# Patient Record
Sex: Female | Born: 1950 | Race: White | Hispanic: No | Marital: Married | Smoking: Never smoker
Health system: Southern US, Community
[De-identification: ages and names within clinical notes are randomized; demographics above are authoritative.]

## PROBLEM LIST (undated history)

## (undated) DIAGNOSIS — E785 Hyperlipidemia, unspecified: Secondary | ICD-10-CM

## (undated) DIAGNOSIS — Z720 Tobacco use: Secondary | ICD-10-CM

## (undated) DIAGNOSIS — I2510 Atherosclerotic heart disease of native coronary artery without angina pectoris: Secondary | ICD-10-CM

## (undated) DIAGNOSIS — I10 Essential (primary) hypertension: Secondary | ICD-10-CM

## (undated) DIAGNOSIS — B351 Tinea unguium: Secondary | ICD-10-CM

## (undated) DIAGNOSIS — K219 Gastro-esophageal reflux disease without esophagitis: Secondary | ICD-10-CM

## (undated) DIAGNOSIS — Z9289 Personal history of other medical treatment: Secondary | ICD-10-CM

## (undated) DIAGNOSIS — Z8669 Personal history of other diseases of the nervous system and sense organs: Secondary | ICD-10-CM

## (undated) DIAGNOSIS — Z9889 Other specified postprocedural states: Secondary | ICD-10-CM

## (undated) DIAGNOSIS — Z2821 Immunization not carried out because of patient refusal: Secondary | ICD-10-CM

## (undated) DIAGNOSIS — R011 Cardiac murmur, unspecified: Secondary | ICD-10-CM

## (undated) DIAGNOSIS — M1990 Unspecified osteoarthritis, unspecified site: Secondary | ICD-10-CM

## (undated) DIAGNOSIS — J309 Allergic rhinitis, unspecified: Secondary | ICD-10-CM

## (undated) HISTORY — DX: Personal history of other diseases of the nervous system and sense organs: Z86.69

## (undated) HISTORY — DX: Tobacco use: Z72.0

## (undated) HISTORY — DX: Hyperlipidemia, unspecified: E78.5

## (undated) HISTORY — PX: ABDOMINAL HYSTERECTOMY: SHX81

## (undated) HISTORY — DX: Other specified postprocedural states: Z98.890

## (undated) HISTORY — DX: Cardiac murmur, unspecified: R01.1

## (undated) HISTORY — DX: Essential (primary) hypertension: I10

## (undated) HISTORY — DX: Allergic rhinitis, unspecified: J30.9

## (undated) HISTORY — DX: Immunization not carried out because of patient refusal: Z28.21

## (undated) HISTORY — DX: Atherosclerotic heart disease of native coronary artery without angina pectoris: I25.10

## (undated) HISTORY — DX: Tinea unguium: B35.1

## (undated) HISTORY — DX: Gastro-esophageal reflux disease without esophagitis: K21.9

## (undated) HISTORY — DX: Unspecified osteoarthritis, unspecified site: M19.90

## (undated) HISTORY — PX: EYE SURGERY: SHX253

## (undated) HISTORY — DX: Personal history of other medical treatment: Z92.89

---

## 1998-09-17 ENCOUNTER — Emergency Department (HOSPITAL_COMMUNITY): Admission: EM | Admit: 1998-09-17 | Discharge: 1998-09-17 | Payer: Self-pay | Admitting: Emergency Medicine

## 1998-09-20 ENCOUNTER — Emergency Department (HOSPITAL_COMMUNITY): Admission: EM | Admit: 1998-09-20 | Discharge: 1998-09-20 | Payer: Self-pay | Admitting: Emergency Medicine

## 2000-10-20 ENCOUNTER — Emergency Department (HOSPITAL_COMMUNITY): Admission: EM | Admit: 2000-10-20 | Discharge: 2000-10-21 | Payer: Self-pay | Admitting: Emergency Medicine

## 2000-12-13 ENCOUNTER — Ambulatory Visit (HOSPITAL_COMMUNITY): Admission: RE | Admit: 2000-12-13 | Discharge: 2000-12-13 | Payer: Self-pay | Admitting: Obstetrics and Gynecology

## 2001-04-18 ENCOUNTER — Encounter: Payer: Self-pay | Admitting: Emergency Medicine

## 2001-04-18 ENCOUNTER — Observation Stay (HOSPITAL_COMMUNITY): Admission: EM | Admit: 2001-04-18 | Discharge: 2001-04-19 | Payer: Self-pay | Admitting: Emergency Medicine

## 2001-11-27 HISTORY — PX: LAPAROSCOPY: SHX197

## 2003-08-28 ENCOUNTER — Emergency Department (HOSPITAL_COMMUNITY): Admission: EM | Admit: 2003-08-28 | Discharge: 2003-08-28 | Payer: Self-pay | Admitting: Emergency Medicine

## 2003-12-29 HISTORY — PX: CAROTID ANGIOGRAM: SHX5765

## 2004-07-04 ENCOUNTER — Encounter: Admission: RE | Admit: 2004-07-04 | Discharge: 2004-07-04 | Payer: Self-pay | Admitting: Family Medicine

## 2004-07-11 ENCOUNTER — Encounter: Admission: RE | Admit: 2004-07-11 | Discharge: 2004-07-11 | Payer: Self-pay | Admitting: Family Medicine

## 2005-11-04 ENCOUNTER — Other Ambulatory Visit: Admission: RE | Admit: 2005-11-04 | Discharge: 2005-11-04 | Payer: Self-pay | Admitting: Family Medicine

## 2005-11-27 DIAGNOSIS — I2510 Atherosclerotic heart disease of native coronary artery without angina pectoris: Secondary | ICD-10-CM

## 2005-11-27 DIAGNOSIS — Z9889 Other specified postprocedural states: Secondary | ICD-10-CM

## 2005-11-27 HISTORY — DX: Other specified postprocedural states: Z98.890

## 2005-11-27 HISTORY — DX: Atherosclerotic heart disease of native coronary artery without angina pectoris: I25.10

## 2005-11-27 HISTORY — PX: CARDIAC CATHETERIZATION: SHX172

## 2005-11-27 HISTORY — PX: LEFT HEART CATH AND CORONARY ANGIOGRAPHY: CATH118249

## 2005-12-02 ENCOUNTER — Observation Stay (HOSPITAL_COMMUNITY): Admission: EM | Admit: 2005-12-02 | Discharge: 2005-12-02 | Payer: Self-pay | Admitting: Emergency Medicine

## 2006-12-17 ENCOUNTER — Ambulatory Visit: Payer: Self-pay | Admitting: Family Medicine

## 2006-12-23 ENCOUNTER — Ambulatory Visit: Payer: Self-pay | Admitting: Family Medicine

## 2006-12-23 ENCOUNTER — Other Ambulatory Visit: Admission: RE | Admit: 2006-12-23 | Discharge: 2006-12-23 | Payer: Self-pay | Admitting: Family Medicine

## 2006-12-29 ENCOUNTER — Encounter: Admission: RE | Admit: 2006-12-29 | Discharge: 2006-12-29 | Payer: Self-pay | Admitting: Family Medicine

## 2007-04-11 ENCOUNTER — Ambulatory Visit: Payer: Self-pay | Admitting: Family Medicine

## 2007-04-12 ENCOUNTER — Encounter: Admission: RE | Admit: 2007-04-12 | Discharge: 2007-04-12 | Payer: Self-pay | Admitting: Family Medicine

## 2007-04-28 ENCOUNTER — Ambulatory Visit: Payer: Self-pay | Admitting: Family Medicine

## 2007-07-17 ENCOUNTER — Emergency Department (HOSPITAL_COMMUNITY): Admission: EM | Admit: 2007-07-17 | Discharge: 2007-07-17 | Payer: Self-pay | Admitting: Emergency Medicine

## 2007-12-29 HISTORY — PX: ESOPHAGOGASTRODUODENOSCOPY: SHX1529

## 2007-12-29 HISTORY — PX: COLONOSCOPY: SHX174

## 2008-01-15 ENCOUNTER — Emergency Department (HOSPITAL_COMMUNITY): Admission: EM | Admit: 2008-01-15 | Discharge: 2008-01-15 | Payer: Self-pay | Admitting: Emergency Medicine

## 2008-01-16 ENCOUNTER — Other Ambulatory Visit: Admission: RE | Admit: 2008-01-16 | Discharge: 2008-01-16 | Payer: Self-pay | Admitting: Family Medicine

## 2008-01-16 ENCOUNTER — Ambulatory Visit: Payer: Self-pay | Admitting: Family Medicine

## 2008-01-16 LAB — HM PAP SMEAR: HM Pap smear: NEGATIVE

## 2008-04-20 LAB — HM MAMMOGRAPHY

## 2008-05-22 ENCOUNTER — Ambulatory Visit: Payer: Self-pay | Admitting: Family Medicine

## 2008-06-18 LAB — HM COLONOSCOPY: HM Colonoscopy: NORMAL

## 2008-09-28 ENCOUNTER — Ambulatory Visit: Payer: Self-pay | Admitting: Family Medicine

## 2008-12-06 ENCOUNTER — Ambulatory Visit: Payer: Self-pay | Admitting: Family Medicine

## 2008-12-12 ENCOUNTER — Ambulatory Visit: Payer: Self-pay | Admitting: Family Medicine

## 2009-05-16 ENCOUNTER — Ambulatory Visit: Payer: Self-pay | Admitting: Family Medicine

## 2010-07-06 ENCOUNTER — Emergency Department (HOSPITAL_COMMUNITY): Admission: EM | Admit: 2010-07-06 | Discharge: 2010-07-06 | Payer: Self-pay | Admitting: Emergency Medicine

## 2010-07-12 ENCOUNTER — Ambulatory Visit (HOSPITAL_COMMUNITY): Admission: RE | Admit: 2010-07-12 | Discharge: 2010-07-12 | Payer: Self-pay | Admitting: Internal Medicine

## 2010-07-16 ENCOUNTER — Ambulatory Visit: Payer: Self-pay | Admitting: Family Medicine

## 2010-07-21 ENCOUNTER — Ambulatory Visit: Payer: Self-pay | Admitting: Physician Assistant

## 2010-10-28 ENCOUNTER — Ambulatory Visit: Payer: Self-pay | Admitting: Family Medicine

## 2010-11-07 ENCOUNTER — Encounter: Admission: RE | Admit: 2010-11-07 | Discharge: 2010-11-07 | Payer: Self-pay | Admitting: Family Medicine

## 2010-11-24 ENCOUNTER — Ambulatory Visit: Payer: Self-pay | Admitting: Family Medicine

## 2011-01-27 ENCOUNTER — Other Ambulatory Visit: Payer: Self-pay | Admitting: Gastroenterology

## 2011-01-30 ENCOUNTER — Other Ambulatory Visit: Payer: Self-pay

## 2011-02-04 ENCOUNTER — Ambulatory Visit
Admission: RE | Admit: 2011-02-04 | Discharge: 2011-02-04 | Disposition: A | Discharge status: Home / Self Care | Payer: Self-pay | Source: Ambulatory Visit | Attending: Gastroenterology | Admitting: Gastroenterology

## 2011-03-19 ENCOUNTER — Institutional Professional Consult (permissible substitution) (INDEPENDENT_AMBULATORY_CARE_PROVIDER_SITE_OTHER): Payer: BC Managed Care – PPO | Admitting: Family Medicine

## 2011-03-19 DIAGNOSIS — I10 Essential (primary) hypertension: Secondary | ICD-10-CM

## 2011-05-15 NOTE — Cardiovascular Report (Signed)
NAMEMARGARITE, [name]               ACCOUNT NO.:  [id number]   MEDICAL RECORD NO.:  [id number]          PATIENT TYPE:  OBV   LOCATION:  6733                         FACILITY:  [facility]   PHYSICIAN:  Cristy Hilts. Jacinto Halim, MD       DATE OF BIRTH:  Apr 27, 1951   DATE OF PROCEDURE:  12/02/2005  DATE OF DISCHARGE:                              CARDIAC CATHETERIZATION   PROCEDURE PERFORMED:  1.  Left ventriculography.  2.  Selective right and left coronary angiography.  3.  Ascending aortogram.  4.  Abdominal aortogram.   INDICATIONS FOR PROCEDURE:  [name] is a 60 year old female with a  history of hypertension and obesity who was admitted to the hospital with  chest pain suggested of angina pectoris. Given this she was brought to the  catheterization laboratory where we evaluated her coronary anatomy for a  definitive diagnosis of coronary artery disease.   HEMODYNAMIC DATA:  The left ventricular pressures were 146/-1 with an end-  diastolic pressure of 13 mmHg. The aortic pressures were 144/75 with a mean  of 105 mmHg. There were no pressure gradient across the aortic valve.   ANGIOGRAPHIC DATA:  1.  Left ventricular systolic function pressure was normal and the ejection      fraction was estimated at 60% to 65%. There was no significant mitral      regurgitation.  2.  The right coronary artery is a large caliber [name] and dominant [name].      It is normal.  3.  The left main is a large caliber [name] and is normal.  4.  The circumflex is a large caliber [name]. It gives rise to a small OM-1      and continues as a large OM-2 after giving a moderate size AV groove      branch. It is normal.  5.  The LAD is a large caliber [name]. It gives rise to a large diagonal-1.      It is normal.   ASCENDING AORTOGRAM:  The ascending aortogram reveals the presence of three  aortic valve cusps. There was no aortic regurgitation. No evidence of  ascending aortic aneurysm.   ABDOMINAL AORTOGRAM:   The abdominal aortogram revealed the presence of two  renal arteries, one on either side. They are widely patent. There was no  evidence of abdominal aortic aneurysm. The aortic iliac bifurcation was  widely patent.   IMPRESSION:  1.  Normal coronaries.  2.  Normal left ventricular systolic function with an ejection fraction of      60% to 65%.  3.  No evidence of renal artery stenosis.   RECOMMENDATIONS:  Evaluation of noncardiac cause of chest pain is indicated.  The patient can be safely discharged home today, and she will follow up with  [facility] for noncardiac chest pain evaluation.   TECHNIQUES OF PROCEDURE:  Under usual sterile precautions using a 6-French  right femoral artery access, 6-French multipurpose B-2 catheter was advanced  into the ascending aorta over a 0.035 inch pigtail catheter.  It was gently  advanced into the left ventricle,  and left ventricular pressures were  monitored.  Hand contrast injection of the left ventricle was performed both  in the LAO and RAO projection. The catheter was flushed with saline, pulled  back, and pressure gradient across the aortic valve was monitored.  The  right coronary artery was selectively engaged and angiography was performed.  Then, the ascending aortogram was performed in the LAO projection. The  catheter was pulled back in the abdominal aorta and abdominal aortogram was  performed.  A 6-French Judkins' JL4 diagnostic catheter was utilized,  engaged the left coronary artery, and angiography was performed. The  Angiocatheter was pulled out of the body in the usual fashion. Right femoral  angiography was performed through the arterial access sheath, but because of  the access being at the bifurcation of SFA and profunda, it was decided to  do manual pressure only at this time.      Cristy Hilts. Jacinto Halim, MD  Electronically Signed     JRG/MEDQ  D:  12/02/2005  T:  12/02/2005  Job:  [id number]   cc:   Thereasa Solo.  Little, M.D.  Fax: [phone]

## 2011-05-15 NOTE — H&P (Signed)
[place]  Patient:    [name], [name]                        MRN: [id number] Adm. Date:  04/18/01 Attending:  Julieanne Manson, M.D. CC:         Ronnald Nian, M.D.                         History and Physical  HISTORY OF PRESENT ILLNESS:  Ms. Belle is a 60 year old female who was awakened from sleep about 4 oclock a.m. with left anterior chest pain just under the left breast. She took two Tums with some mild relief and burping. She called her daughter who said she developed generalized weakness. Still had pain under her left breast, mild shortness of breath, became diaphoretic with this, had some nausea but no vomiting. She presented to the emergency room.  One day previous to this, she had diaphoresis and some left jaw and left lateral neck pain. She denies any shortness of breath, prior chest pain. She has been more constipated since Christmas. She has no other GI complaints. In the emergency room, she was given sublingual nitroglycerin x 2 with some improvement of her discomfort. She had a similar episode about 6 months ago but not as intense as what she described today.  She has had no significant arrhythmias. Has had no unilateral weakness or blurred vision.  CURRENT MEDICATIONS:  None.  PAST MEDICAL HISTORY:  History of questionable hypertension on no treatment. Stress test in 1994 was normal.  PAST SURGICAL HISTORY:  Status post hysterectomy age 72, laparoscopic surgery for left ovarian cyst December 2001.  ALLERGIES:  None.  SOCIAL HISTORY:  The patient dips snuff. Denies smoking. Two beers per day. Three children, eight grandchildren. Married 31 years. 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 Negative for any type of physical activity. Poor diet, a lot of fast foods, no salt restriction.  FAMILY HISTORY:  Positive for hypertension and heart failure in her mother. Father died at age 87, CAD. Has a brother with a  pacemaker and CAD. Two other brothers with cardiac histories as young as age 102.  REVIEW OF SYSTEMS:  No headaches, visual changes. No heat intolerance. No shortness of breath, cough, or wheezing. Rare episodes of a fluttering sensation at the base of her throat. Increased constipation but no blood in her bowel movements since Christmas. States this all occurred after her laparoscopy. Negative for URI-type complaints. Has had some swelling of her ankles and knees. Weights up 20 pounds in the last 2 years.  PHYSICAL EXAMINATION:  GENERAL:  Obese female, no acute distress.  VITAL SIGNS:  Blood pressure 120/70, pulse 54, respirations 20, temperature 97.6, saturations on room air 99%.  HEENT:  Unremarkable.  NECK:  Negative for JVD, carotid bruits. No thyroid enlargement or cervical adenopathy.  LUNGS:  Clear bilaterally. No wheezes heard.  CARDIAC:  Regular rhythm with murmurs.  ABDOMEN:  Mild left upper quadrant and midepigastric tenderness with no guarding or rebound. Normal bowel sounds. No abdominal bruits.  EXTREMITIES:  Negative for edema, +2 pulses bilaterally.  NEUROLOGICAL:  The patient is alert and oriented x 3 with no gross focal neurologic deficits.  SKIN:  Warm and dry.  LABORATORY DATA:  EKG normal.  Cardiac enzymes unremarkable. Hemoglobin is 13.  ASSESSMENT AND PLAN:  Chest pain. Suspect gastrointestinal in nature, doubt cardiac although very strong  family history. The fact that she got some relief with burping and belching, has epigastric tenderness makes me think this is probably GI. I have started her on H2 blockers. Will rule out an MI. If this is negative, we will schedule for a nuclear study.DD:  04/18/01 TD:  04/18/01 Job: [id number]

## 2011-05-15 NOTE — H&P (Signed)
NAME:  MCKINSEY, KEAGLE NO.:  [id number]   MEDICAL RECORD NO.:  [id number]          PATIENT TYPE:  EMS   LOCATION:  MAJO                         FACILITY:  [facility]   PHYSICIAN:  Ulyses Amor, MD DATE OF BIRTH:  [birth year]   DATE OF ADMISSION:  12/01/2005  DATE OF DISCHARGE:                                HISTORY & PHYSICAL   [name] is a 60 year old white woman who is admitted to [facility] for further evaluation of chest pain.   The patient, who has no past history of cardiac disease, experienced the  onset of chest pain while sitting on the couch this evening. This was  described as a sharp discomfort located in the left inframammary and left  anterior subcostal region. It radiated up to her neck. It was associated  with dyspnea, diaphoresis and mild nausea. She also experienced tingling of  her right hand. There were no exacerbating or ameliorating factors. It  appeared not to be related to position, activity, meals, or respirations. It  eventually resolved after approximately one hour. She is largely  asymptomatic at this time.   As noted, the patient has no past history of cardiac disease, including no  history of myocardial infarction, coronary artery disease, congestive heart  failure, or arrhythmia. She did undergo stress testing by Dr. Clarene Duke in the  remote past; this was reportedly negative. She has a history of hypertension  for which she takes medications. She is also known to have elevated blood  lipids but she has not been placed on medications for these yet. There was  no history of smoking. There was no history of diabetes mellitus. There was  a strong family history of coronary artery disease. Her father died of a  myocardial infarction at age 6 and the mother suffered from coronary artery  disease though at an advanced age.   MEDICATIONS:  The patient's only medication is Lotensin.   ALLERGIES:  None.   OPERATIONS:   Hysterectomy.   SOCIAL HISTORY:  The patient lives with her husband. 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 She does not smoke cigarettes. She drinks occasional  beer.   REVIEW OF SYSTEMS:  No problems related to her head, eyes, ears, nose,  mouth, throat, lungs, gastrointestinal system, genitourinary system, or  extremities. There is no history of neurologic or psychiatric disorder.  There is no history of fever, chills, or weight loss.   PHYSICAL EXAMINATION:  Blood pressure 129/65, pulse 70 and regular,  respirations 18, temperature 98.3. The patient was an obese, middle-aged  white woman in no discomfort. She was alert, oriented, appropriate and  responsive. Head, eyes, nose, and mouth were normal. The neck was without  thyromegaly or adenopathy. Carotid pulses were palpable bilaterally without  bruits. Cardiac examination revealed a normal S1 and S2. There was no S3,  S4, murmur, rub or click. Cardiac rhythm was regular. No chest wall  tenderness was noted. The lungs were clear. The abdomen was soft and  nontender. There was no mass, hepatosplenomegaly, bruit, distension,  rebound, guarding, or rigidity. Bowel  sounds were normal. Breast, pelvic,  and rectal examination were not performed as they were not pertinent to the  reason for acute care hospitalization. The extremities were without edema,  deviation or deformity. Radial and dorsalis pedis pulses were palpable  bilaterally. Brief screening neurologic survey was unremarkable.   The electrocardiogram was normal. The chest radiograph, according to the  radiologist, demonstrated no evidence of acute disease. There was mild  bibasilar atelectasis. The initial set of cardiac markers revealed a  myoglobin of 38.3, CK-MB 1.2 and troponin less than 0.05. The second set of  cardiac markers revealed a myoglobin of 48.3, CK-MB 1.7, and troponin less  than 0.05. The third set of cardiac markers revealed a myoglobin of 60.7, CK-  MB 1.8,  and troponin less than 0.05. White count was 6.8 with a hemoglobin  of 12.5 and a hematocrit of 35.2. Fibrin derivatives were 0.28. Potassium  was 3.2, BUN 12, and creatinine 0.7. The remaining studies were pending at  the time of this dictation.   IMPRESSION:  1.  Chest pain:  Rule out coronary artery disease.  2.  Hypertension.   PLAN:  1.  Telemetry.  2.  Serial cardiac enzymes.  3.  Aspirin.  4.  Intravenous nitroglycerin.  5.  Intravenous heparin.  6.  Fasting lipid profile.  7.  Further measures per Dr. Clarene Duke.      Ulyses Amor, MD  Electronically Signed     MSC/MEDQ  D:  12/02/2005  T:  12/02/2005  Job:  [id number]   cc:   Thereasa Solo. Little, M.D.  Fax: [phone]

## 2011-05-15 NOTE — Discharge Summary (Signed)
[name], [name]               ACCOUNT NO.:  [id number]   MEDICAL RECORD NO.:  [id number]          PATIENT TYPE:  OBV   LOCATION:  6733                         FACILITY:  [facility]   PHYSICIAN:  Thereasa Solo. Little, M.D. DATE OF BIRTH:  1951-06-09   DATE OF ADMISSION:  12/01/2005  DATE OF DISCHARGE:  12/02/2005                                 DISCHARGE SUMMARY   CHIEF COMPLAINT:  1.  Chest pain, rule out MI.  2.  Questionable SVT, with no prior history, no documentation.  3.  Hypertension.  4.  Hypokalemia.   DISCHARGE DIAGNOSES:  1.  Chest pain, rule out myocardial infarction.  2.  Questionable supraventricular tachycardia, with no prior history, no      documentation.  3.  Hypertension.  4.  Hypokalemia.   PROCEDURE:  Cardiac catheterization, December 02, 2005, Dr. Jacinto Halim.   HISTORY:  The patient is a 60 year old female who presented to the emergency  room in the early a.m. of December 02, 2005 and was admitted by Dr. Waldon Reining  with complaints of chest pain.  The patient's only history is that of some  hypertension and prior hysterectomy.  Her medication on admission was  Lotensin only.  Because of her complaints, she was placed on heparin, IV  nitroglycerin, and subsequently taken to the catheterization lab that day by  Dr. Yates Decamp.  Cardiac catheterization showed completely normal coronaries,  with an EF of 65%.  It was Dr. Verl Dicker opinion that there was no cardiac  source for her chest discomfort and recommended that she be placed on a  proton pump inhibitor and return to her primary care which is [facility].  Labs on admission showed white count 6.8, hemoglobin 12.5,  hematocrit 35.2, platelet count 209,000.  Initial potassium on admission  which was actually at 8:30 p.m. on December 01, 2005 was 3.2, with glucose  137, BUN 12, and creatinine 0.7.  Followup the next morning showed her  potassium had been replaced and was 4.2.  Her glucose was 84.  BUN was 8,  creatinine 0.6.  CKs and troponins were all negative x3.  Total cholesterol  was 160, triglycerides 118, HDL 40, LDL 96.  Point of care markers on  admission were also negative.      Eber Hong, P.A.    ______________________________  Thereasa Solo Little, M.D.    WDJ/MEDQ  D:  02/03/2006  T:  02/03/2006  Job:  [id number]   cc:   [facility]

## 2011-05-15 NOTE — H&P (Signed)
[facility]  Patient:    [name], [name]                        MRN: [id number] Adm. Date:  12/13/00 Attending:  Juluis Mire, M.D.                         History and Physical  REASON FOR ADMISSION:         A 60 year old gravida 2, para 2, married white female presents for diagnostic laparoscopy, laser standby.  HISTORY:                      The patient had a previous total abdominal hysterectomy in 1979.  She was referred to our practice by Dr. Susann Givens for evaluation of a left ovarian cyst.  She had an ultrasound performed on November 01, 2000, as ordered by Dr. Susann Givens, with the finding of a 4.7 cm left ovarian cyst.  She had a longstanding history of increasing pelvic pain and abdominal discomfort, and it was for that indication the ultrasound was ordered.  She was seen in the office where was exam was basically unremarkable except for pelvic tenderness.  Follow-up ultrasound November 26, 2000, did reveal a 4 cm left ovarian cyst with septation and a slight solid area.  We watched this conservatively, and it did persist.  She was set up for laparoscopic evaluation.  She was seen in our office on December 09, 2000. Repeat ultrasound at that time had revealed basic resolution of the ovarian cyst.  The patient, however, was still experiencing pain and discomfort and after discussion, wishes to proceed with laparoscopic evaluation to rule out such processes as adhesions or endometriosis.  ALLERGIES:                    No known drug allergies.  MEDICATIONS:                  Vicodin.  PAST MEDICAL HISTORY:         Usual childhood diseases.  No significant sequelae.  PAST SURGICAL HISTORY:        She had a hysterectomy in 1979 and a bladder suspension.  OBSTETRICAL HISTORY:          She had three spontaneous vaginal deliveries.  FAMILY HISTORY:               Mother has a history of hypertension.  Both her mother and father have history of heart  disease.  Maternal grandmother had some form of cancer.  SOCIAL HISTORY:               Occasional alcohol with no tobacco use.  REVIEW OF SYSTEMS:            Noncontributory.  PHYSICAL EXAMINATION:  VITAL SIGNS:                  The patient is afebrile with stable vital signs.  HEENT:                        The patient normocephalic, pupils, equal, round, reactive to light and accommodation, extraocular movements intact, sclerae and conjunctivae clear.  Oropharynx clear.  NECK:                         Without thyromegaly.  BREASTS:  Not examined.  LUNGS:                        Clear.  CARDIOVASCULAR:               Regular rate and rhythm, no murmurs or gallops.  ABDOMINAL:                    Benign.  PELVIC:                       Normal external genitalia.  Vaginal mucosa is clear.  Cuff well healed.  Bimanual exam reveals tenderness but no masses.  RECTOVAGINAL:                 Clear.  EXTREMITIES:                  Trace edema.  NEUROLOGICAL:                 Grossly within normal limits.  IMPRESSION:                   Pelvic pain rule out pelvic adhesions versus endometriosis.  PLAN:                         The patient is to undergo laparoscopic evaluation.  The risks have been discussed including the risk of anesthesia. The risks of incisional infection or bruising.  The risk of vascular injury that could require transfusion with the risk of AIDS or hepatitis.  The risk of injury to adjacent organs such as bowel, bladder or that could require further exploratory surgery.  The risks of deep venous thrombosis and pulmonary embolus.  The patient expressed understanding of indications and risks. DD:  12/13/00 TD:  12/13/00 Job: [id number]

## 2011-05-15 NOTE — Op Note (Signed)
[facility]  Patient:    [name], [name]                      MRN: [id number] Proc. Date: 12/13/00 Adm. Date:  [id number] Disc. Date: [id number] Attending:  Shelba Flake                           Operative Report  PREOPERATIVE DIAGNOSIS:       Pelvic pain.  POSTOPERATIVE DIAGNOSIS:      Pelvic adhesions.  OPERATION:                    Diagnostic laparoscopy and lysis of adhesions.  SURGEON:                      Juluis Mire, M.D.  ASSISTANT:  ANESTHESIA:                   General endotracheal anesthesia.  ESTIMATED BLOOD LOSS:         Minimal.  PACKS AND DRAINS:             None.  INTRAOPERATIVE BLOOD PLACED:  None.  COMPLICATIONS:                None.  INDICATIONS:                  These are dictated in the history and physical.  DESCRIPTION OF PROCEDURE:     The patient was taken to the OR and placed in the supine position.  After satisfactory level of general endotracheal anesthesia was attained, the patient was placed in the dorsal lithotomy position using the Allen stirrups.  The abdomen, perineum and vagina were prepped out with Betadine.  Examination under anesthesia was unremarkable. Bladder was emptied by in and out catheterization.  A sponge stick was placed in the vaginal vault for elevation.  The patient was draped out as a sterile field.  A subumbilical incision was made with a knife. The Verres was then introduced into the abdominal cavity. The abdomen was inflated to 20 mmHg of pressure. The laparoscope was then introduced.  There was no evidence of injury to adjacent organs.  A 5 mm trocar was placed in the suprapubic area under direct visualization. Visualization revealed the upper abdomen including liver, tip of the gallbladder and both lateral gutters to be clear.  She had marked adhesions in the pelvic cavity, some involving the omentum to the vaginal cuff. These were taken down using scissors and Bipolar for  cautery.  The right ovary was plastered to the right pelvic sidewall.  We were able to visualize the cul-de-sac.  We tried to free up the bowel on the left side but due to the thickness of the adhesions, this was discontinued.  We never did see the left ovary.  We assumed it was encased in the intestines.  There was no active bleeding.  We used thorough irrigation.  At the end of the procedure we had freed up some of the pelvic adhesions.  Again, the right ovary was densely adherent to the right pelvic sidewall.  The left ovary was encased in the sigmoid colon.  At this point in time the abdomen was desufflated of carbon dioxide.  All trocars were removed.  The subumbilical incision was closed with interrupted subcuticular sutures of 4-0 Vicryl and suprapubic incision was closed with Steri-Strips.  Sponge stick was removed from vaginal vault.  The patient was taken out of the dorsal lithotomy position and, once extubated and alert, was transferred to recovery room in good condition.  Sponge, needle and instrument counts were reported as correct by the circulating nurse. DD:  12/13/00 TD:  12/13/00 Job: [id number]

## 2011-06-01 ENCOUNTER — Other Ambulatory Visit: Payer: Self-pay | Admitting: *Deleted

## 2011-06-01 DIAGNOSIS — I10 Essential (primary) hypertension: Secondary | ICD-10-CM

## 2011-06-01 MED ORDER — AMLODIPINE BESYLATE 10 MG PO TABS: 10.0000 mg | ORAL_TABLET | Freq: Every day | ORAL | Status: DC

## 2011-06-01 NOTE — Telephone Encounter (Signed)
Dr.Knapp increased pt's amlodipine from 5mg  to 10mg  based on fax from patient. Some readings from this fax were, 150/77, 145/84, 123/71 and 150/82. Called in amlodipine 10mg  #30 qd w/1RF per Dr.Knapp to [facility]. Left message for patient to fax BP readings in the next month or schedule OV, which ever she choses.

## 2011-07-13 ENCOUNTER — Other Ambulatory Visit: Payer: Self-pay | Admitting: Family Medicine

## 2011-07-13 DIAGNOSIS — I10 Essential (primary) hypertension: Secondary | ICD-10-CM

## 2011-07-13 MED ORDER — AMLODIPINE BESYLATE 10 MG PO TABS: 10.0000 mg | ORAL_TABLET | Freq: Every day | ORAL | Status: DC

## 2011-09-18 ENCOUNTER — Encounter: Payer: Self-pay | Admitting: Family Medicine

## 2011-09-21 ENCOUNTER — Encounter: Payer: Self-pay | Admitting: Family Medicine

## 2011-09-21 ENCOUNTER — Ambulatory Visit (INDEPENDENT_AMBULATORY_CARE_PROVIDER_SITE_OTHER): Payer: BC Managed Care – PPO | Admitting: Family Medicine

## 2011-09-21 DIAGNOSIS — I10 Essential (primary) hypertension: Secondary | ICD-10-CM

## 2011-09-21 DIAGNOSIS — Z23 Encounter for immunization: Secondary | ICD-10-CM

## 2011-09-21 DIAGNOSIS — E782 Mixed hyperlipidemia: Secondary | ICD-10-CM

## 2011-09-21 LAB — COMPREHENSIVE METABOLIC PANEL
ALT: 18 U/L (ref 0–35)
AST: 21 U/L (ref 0–37)
Albumin: 4.5 g/dL (ref 3.5–5.2)
Alkaline Phosphatase: 71 U/L (ref 39–117)
BUN: 14 mg/dL (ref 6–23)
CO2: 26 mEq/L (ref 19–32)
Calcium: 9.2 mg/dL (ref 8.4–10.5)
Chloride: 104 mEq/L (ref 96–112)
Creat: 0.59 mg/dL (ref 0.50–1.10)
Glucose, Bld: 86 mg/dL (ref 70–99)
Potassium: 3.6 mEq/L (ref 3.5–5.3)
Sodium: 140 mEq/L (ref 135–145)
Total Bilirubin: 0.7 mg/dL (ref 0.3–1.2)
Total Protein: 7.3 g/dL (ref 6.0–8.3)

## 2011-09-21 LAB — LIPID PANEL
Cholesterol: 205 mg/dL — ABNORMAL HIGH (ref 0–200)
HDL: 46 mg/dL (ref >39)
LDL Cholesterol: 126 mg/dL — ABNORMAL HIGH (ref 0–99)
Total CHOL/HDL Ratio: 4.5 Ratio
Triglycerides: 167 mg/dL — ABNORMAL HIGH (ref <150)
VLDL: 33 mg/dL (ref 0–40)

## 2011-09-21 MED ORDER — AMLODIPINE BESYLATE 10 MG PO TABS: 10.0000 mg | ORAL_TABLET | Freq: Every day | ORAL | Status: DC

## 2011-09-21 MED ORDER — HYDROCHLOROTHIAZIDE 25 MG PO TABS: 12.5000 mg | ORAL_TABLET | Freq: Every day | ORAL | Status: DC

## 2011-09-21 NOTE — Patient Instructions (Addendum)
Remember to get potassium daily in your diet. Limit sodium to 2000mg  daily  Continue to check your blood pressure periodically.  Please fax over list of BP in about 1-2 months.  Follow up if having problems tolerating the medications (ie ongoing swelling, bad muscle cramps, BP's not well controlled)  Exercise at least 30-60 minutes every day, at least 5-6 days/week.  Please quit using smokeless tobacco products.  Please also make sure you see the dentist every 6 months.  Schedule mammogram--if you previously went to [place], I believe your records will be at [facility], on [address] (call to verify that)

## 2011-09-21 NOTE — Progress Notes (Signed)
Patient presents for f/u HTN.  Blood pressure has been running 132-137s/78-81 on the 10 mg Amlodipine (had been 150's on 5mg , and dose was increased in 04/2011).  Denies headaches.  Always has some mild dizziness. Previously took Benazepril and HCTZ, but had hives in 10/2010 and was taken off both medications, switched to amlodipine at that time.  Was felt to most likely be a reaction to the benazepril, but both were stopped.  Now having some complaints of swelling--mainly in her ankles, sometimes in her eyes and hands.  Has noticed this over the last few months, which happens to coincide with the increase in amlodipine dose. Denies any shortness of breath.  Last labs 10/2010--TG 207, LDL 120.  Prior to that, had LDL 136.  One time had elevating sugar (?fasting), A1c's have been normal, and glucose 85 last year. Exercise--on feet all day at work, no other exercise  Past Medical History  Diagnosis Date  . Allergic rhinitis, cause unspecified   . Hypertension   . Obesity   . Dyslipidemia   . GERD (gastroesophageal reflux disease)   . History of migraine headaches     Past Surgical History  Procedure Date  . Abdominal hysterectomy     PARTIAL (benign, for bleeding)  . Laparoscopy 12/02    ovarian cyst  . Cardiac catheterization 12/06    Dr. Clarene Duke  . Esophagogastroduodenoscopy 2009    Dr. Loreta Ave  . Colonoscopy 2009    Dr. Loreta Ave    History   Social History  . Marital Status: Married    Spouse Name: N/A    Number of Children: 3  . Years of Education: N/A   Occupational History  . 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 Smoking status: Never Smoker   . Smokeless tobacco: Current User    Types: Snuff  . Alcohol Use: Yes     12 drinks per week.  . Drug Use: No  . Sexually Active: Not on file   Other Topics Concern  . Not on file   Social History Narrative   Lives with husband and grandson (20 yo--lives with them since born, raised by her)     Family History  Problem Relation Age of Onset  . Heart disease Mother     CHF  . Hypertension Mother   . Heart disease Father 67    MI  . Heart disease Brother   . Heart disease Maternal Aunt   . Diabetes Maternal Aunt   . Cancer Maternal Aunt     cervical  . Heart disease Maternal Uncle   . Diabetes Maternal Uncle   . Heart disease Maternal Grandmother     CHF  . Diabetes Maternal Grandmother   . Heart disease Paternal Uncle   . Heart disease Brother   . Heart disease Brother    Allergies  Allergen Reactions  . Morphine And Related Other (See Comments)    Palpitations and shaking.   ROS: Denies fever, URI symptoms, chest pain, palpitations, shortness of breath, GI complaints, skin concerns. +edema. Denies headaches, dizziness  PHYSICAL EXAM: BP 142/76  Pulse 72  Ht 5\' 3"  (1.6 m)  Wt 181 lb (82.101 kg)  BMI 32.06 kg/m2 Pleasant overweight female in no distress Neck: no lymphadenopathy or thyromegaly, no elevated JVP Heart: regular rate and rhythm without murmur Lungs: clear bilaterally Abdomen: soft, nontender, no organomegaly Extremities: trace edema, 2+ pulses Skin: no rash Psych: normal mood, affect, hygiene and  grooming  ASSESSMENT/PLAN: 1. Unspecified essential hypertension  amLODipine (NORVASC) 10 MG tablet, hydrochlorothiazide (HYDRODIURIL) 25 MG tablet, Comprehensive metabolic panel  2. Need for Tdap vaccination  Tdap vaccine greater than or equal to 7yo IM  3. Mixed hyperlipidemia  Lipid panel   HTN--improved control, still borderline.  Having some swelling, likely side effect from amlodipine.  Discussed low sodium diet.  Discussed dietary potassium--to return for bloodwork if having significant muscle cramps despite dietary potassium.  Re-start HCTZ 12.5mg  daily.  Slight possibility that her previous hives and angioedema could have been due to the HCTZ rather than the benazepril that she was also taking, but less likely.  Stop immediately if any  hives/swelling occurs.  TdaP recommended. Reviewed chart from 11/06--given pneumovax and Td--returned shortly after with reaction, treated for cellulitis. Discussed possibility of recurrent reaction--to use Benadryl and cool compresses if needed.  Return if ongoing/worsening reaction/swelling  Encouraged to quit using smokeless tobacco products, and to make sure she gets regular dental visits.  Discussed increased risks of oral cancers.  Declines flu shot.  F/u 6 months, sooner prn

## 2011-09-22 ENCOUNTER — Encounter: Payer: Self-pay | Admitting: Family Medicine

## 2012-04-18 ENCOUNTER — Telehealth: Payer: Self-pay | Admitting: Family Medicine

## 2012-04-18 DIAGNOSIS — I10 Essential (primary) hypertension: Secondary | ICD-10-CM

## 2012-04-18 MED ORDER — AMLODIPINE BESYLATE 10 MG PO TABS: 10.0000 mg | ORAL_TABLET | Freq: Every day | ORAL | Status: DC

## 2012-04-19 ENCOUNTER — Encounter: Payer: Self-pay | Admitting: Internal Medicine

## 2012-04-19 NOTE — Telephone Encounter (Signed)
TSD  

## 2012-04-28 ENCOUNTER — Ambulatory Visit (INDEPENDENT_AMBULATORY_CARE_PROVIDER_SITE_OTHER): Payer: BC Managed Care – PPO | Admitting: Family Medicine

## 2012-04-28 ENCOUNTER — Encounter: Payer: Self-pay | Admitting: Family Medicine

## 2012-04-28 VITALS — BP 138/78 | HR 68 | Ht 63.0 in | Wt 182.0 lb

## 2012-04-28 DIAGNOSIS — Z720 Tobacco use: Secondary | ICD-10-CM

## 2012-04-28 DIAGNOSIS — I10 Essential (primary) hypertension: Secondary | ICD-10-CM

## 2012-04-28 DIAGNOSIS — F17200 Nicotine dependence, unspecified, uncomplicated: Secondary | ICD-10-CM

## 2012-04-28 DIAGNOSIS — K219 Gastro-esophageal reflux disease without esophagitis: Secondary | ICD-10-CM | POA: Insufficient documentation

## 2012-04-28 DIAGNOSIS — Z79899 Other long term (current) drug therapy: Secondary | ICD-10-CM

## 2012-04-28 LAB — BASIC METABOLIC PANEL
BUN: 17 mg/dL (ref 6–23)
CO2: 29 mEq/L (ref 19–32)
Calcium: 8.9 mg/dL (ref 8.4–10.5)
Chloride: 104 mEq/L (ref 96–112)
Creat: 0.47 mg/dL — ABNORMAL LOW (ref 0.50–1.10)
Glucose, Bld: 102 mg/dL — ABNORMAL HIGH (ref 70–99)
Potassium: 3.4 mEq/L — ABNORMAL LOW (ref 3.5–5.3)
Sodium: 140 mEq/L (ref 135–145)

## 2012-04-28 MED ORDER — AMLODIPINE BESYLATE 10 MG PO TABS: 10.0000 mg | ORAL_TABLET | Freq: Every day | ORAL | Status: DC

## 2012-04-28 NOTE — Patient Instructions (Signed)
Take amlodipine every day (full tablet). Take the HCTZ HALF TABLET EVERY DAY--this will help control the blood pressure, and keep the swelling down.  Keep track of your BP's at home. Do this regimen daily, regardless of whether or not there is swelling. Make sure to try and get some potassium in the diet (OJ, banana, potato).    Please stop using smokeless tobacco products, and remember to follow up with dentist  Reflux:   Diet for GERD or PUD Nutrition therapy can help ease the discomfort of gastroesophageal reflux disease (GERD) and peptic ulcer disease (PUD).  HOME CARE INSTRUCTIONS   Eat your meals slowly, in a relaxed setting.   Eat 5 to 6 small meals per day.   If a food causes distress, stop eating it for a period of time.  FOODS TO AVOID  Coffee, regular or decaffeinated.   Cola beverages, regular or low calorie.   Tea, regular or decaffeinated.   Pepper.   Cocoa.   High fat foods, including meats.   Butter, margarine, hydrogenated oil (trans fats).   Peppermint or spearmint (if you have GERD).   Fruits and vegetables if not tolerated.   Alcohol.   Nicotine (smoking or chewing). This is one of the most potent stimulants to acid production in the gastrointestinal tract.   Any food that seems to aggravate your condition.  If you have questions regarding your diet, ask your caregiver or a registered dietitian. TIPS  Lying flat may make symptoms worse. Keep the head of your bed raised 6 to 9 inches (15 to 23 cm) by using a foam wedge or blocks under the legs of the bed.   Do not lay down until 3 hours after eating a meal.   Daily physical activity may help reduce symptoms.  MAKE SURE YOU:   Understand these instructions.   Will watch your condition.   Will get help right away if you are not doing well or get worse.  Document Released: 12/14/2005 Document Revised: 12/03/2011 Document Reviewed: 10/30/2011 [facility] Patient Information 2012 [place], [place].

## 2012-04-28 NOTE — Progress Notes (Signed)
Patient presents for 6 month follow up on hypertension.  She hasn't checked her BP since being on the HCTZ, maybe just once and it was 140 systolic.  She was continued on amlodipine 10mg  at last visit, despite having some complaints of swelling, and HCTZ was added back.  She has had no problems with hives or reaction (previously had allergic reaction when on both ACEI and HCTZ and both had been stopped--this was a retry of the HCTZ and she did well without problems).  She admits that she didn't take the HCTZ as originally instructed, which was to take just 1/2 tablet every day.  Instead, has been taking it as needed for swelling. In general, taking full tablet for 3-5 days in a row when she is swollen, and then not taking it for about 3-5 days, until swelling recurs again.  She finds that when she is taking the full tablet of HCTZ for a few days in a row, her head feels swimmy-headed, like she is drunk.  Hasn't checked her BP when she feels like that to know if it is low.  Hasn't taken HCTZ in 3-4 days.  Slight discomfort to Right ear, intermittent vertigo/dizziness.  Denies allergy symptoms; has some cough related to when she is breathing in her husband's cigarette smoke.  Still using smokeless tobacco products.  Hasn't been exercising lately.  Past Medical History  Diagnosis Date  . Allergic rhinitis, cause unspecified   . Hypertension   . Obesity   . Dyslipidemia   . GERD (gastroesophageal reflux disease)   . History of migraine headaches     Past Surgical History  Procedure Date  . Abdominal hysterectomy     PARTIAL (benign, for bleeding)  . Laparoscopy 12/02    ovarian cyst  . Cardiac catheterization 12/06    Dr. Clarene Duke  . Esophagogastroduodenoscopy 2009    Dr. Loreta Ave  . Colonoscopy 2009    Dr. Loreta Ave    History   Social History  . Marital Status: Married    Spouse Name: N/A    Number of Children: 3  . Years of Education: N/A   Occupational History  . 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 Smoking status: Never Smoker   . Smokeless tobacco: Current User    Types: Snuff  . Alcohol Use: Yes     12 drinks per week.  . Drug Use: No  . Sexually Active: Not on file   Other Topics Concern  . Not on file   Social History Narrative   Lives with husband and grandson (20 yo--lives with them since born, raised by her)    Family History  Problem Relation Age of Onset  . Heart disease Mother     CHF  . Hypertension Mother   . Heart disease Father 65    MI  . Heart disease Brother   . Heart disease Maternal Aunt   . Diabetes Maternal Aunt   . Cancer Maternal Aunt     cervical  . Heart disease Maternal Uncle   . Diabetes Maternal Uncle   . Heart disease Maternal Grandmother     CHF  . Diabetes Maternal Grandmother   . Heart disease Paternal Uncle   . Heart disease Brother   . Heart disease Brother     Current outpatient prescriptions:amLODipine (NORVASC) 10 MG tablet, Take 1 tablet (10 mg total) by mouth daily., Disp: 90 tablet, Rfl: 1;  cetirizine (ZYRTEC) 10 MG  tablet, Take 10 mg by mouth daily.  , Disp: , Rfl: ;  hydrochlorothiazide (HYDRODIURIL) 25 MG tablet, Take 0.5 tablets (12.5 mg total) by mouth daily., Disp: 45 tablet, Rfl: 1 DISCONTD: amLODipine (NORVASC) 10 MG tablet, Take 1 tablet (10 mg total) by mouth daily., Disp: 30 tablet, Rfl: 0;  aspirin 81 MG tablet, Take 81 mg by mouth daily.  , Disp: , Rfl:   Allergies  Allergen Reactions  . Morphine And Related Other (See Comments)    Palpitations and shaking.    ROS: Denies chest pain, palpitations, headaches.  See HPI for dizziness.  Has some troubles swallowing pills, denies dysphagia for food or drink.  Has some indigestion (mild) at night, although has gotten better.  Pt has had stress test in the past, which was fine. Strong family h/o heart disease.  PHYSICAL EXAM: BP 138/78  Pulse 68  Ht 5\' 3"  (1.6 m)  Wt 182 lb (82.555 kg)  BMI 32.24 kg/m2 HEENT:   TM's and EAC's normal.  OP clear Neck: no lymphadenopathy Heart: 2/6 SEM, regular rate and rhythm Lungs: clear bilaterally Extremities: Trace to 1+ pretibial edema Abdomen--nontender, soft, no organomegaly or mass Skin: no rash Psych: normal mood, affect, hygiene and grooming  ASSESSMENT/PLAN: 1. Unspecified essential hypertension  Basic metabolic panel, amLODipine (NORVASC) 10 MG tablet  2. Encounter for long-term (current) use of other medications  Basic metabolic panel  3. GERD (gastroesophageal reflux disease)    4. Tobacco use     HTN--borderline control.  Recommend using daily 1/2 tablet of HCTZ daily, in addition to 10 mg of amlodipine.  Do this regimen daily, regardless of whether or not there is swelling. Make sure to try and get some potassium in the diet (OJ, banana, potato).    Tobacco use--encouraged to quit, risks reviewed.  F/u with dentist  GERD--precautions reviewed.  If develops significant dysphagia, will need to be referred to GI.

## 2012-07-11 ENCOUNTER — Ambulatory Visit (INDEPENDENT_AMBULATORY_CARE_PROVIDER_SITE_OTHER): Payer: BC Managed Care – PPO | Admitting: Family Medicine

## 2012-07-11 VITALS — BP 140/78 | HR 76 | Temp 98.4°F | Resp 18 | Ht 62.5 in | Wt 178.0 lb

## 2012-07-11 DIAGNOSIS — N952 Postmenopausal atrophic vaginitis: Secondary | ICD-10-CM

## 2012-07-11 DIAGNOSIS — L293 Anogenital pruritus, unspecified: Secondary | ICD-10-CM

## 2012-07-11 DIAGNOSIS — N898 Other specified noninflammatory disorders of vagina: Secondary | ICD-10-CM

## 2012-07-11 DIAGNOSIS — N390 Urinary tract infection, site not specified: Secondary | ICD-10-CM

## 2012-07-11 LAB — POCT URINALYSIS DIPSTICK
Blood, UA: NEGATIVE
Glucose, UA: NEGATIVE
Leukocytes, UA: NEGATIVE
Nitrite, UA: NEGATIVE
Protein, UA: 30
Spec Grav, UA: 1.025
Urobilinogen, UA: 2
pH, UA: 6

## 2012-07-11 LAB — POCT WET PREP WITH KOH
KOH Prep POC: NEGATIVE
Trichomonas, UA: NEGATIVE
Yeast Wet Prep HPF POC: NEGATIVE

## 2012-07-11 LAB — POCT UA - MICROSCOPIC ONLY
Casts, Ur, LPF, POC: NEGATIVE
Yeast, UA: NEGATIVE

## 2012-07-11 MED ORDER — ESTROGENS, CONJUGATED 0.625 MG/GM VA CREA: TOPICAL_CREAM | Freq: Every day | VAGINAL | Status: AC

## 2012-07-11 NOTE — Progress Notes (Addendum)
Date:  07/11/2012   Name:  [name]   DOB:  12-18-1951   MRN:  [id number]  PCP:  Lavonda Jumbo, MD    Chief Complaint: Vaginal Itching   History of Present Illness:  [name] is a 61 y.o. very pleasant female patient who presents with the following:  She is concerned about a yeast infection.  She has not been sexually active since January of this year- she is SA only with her husband of more than 30 years.  She notes mostly vaginal itching and some discharge.  She has tried several OTC yeast treatments but only got temporary relief.  She does not note any urinary symptoms.   She is s/p hysterectomy.  She still has her ovaries.   She forgot her BP medication this am.   She is also concerned as she is raising her grandson and "he brings girls home sometimes."  She wonders if she could have caught some sort of infection from a toilet seat.   Patient Active Problem List  Diagnosis  . Unspecified essential hypertension  . Mixed hyperlipidemia  . GERD (gastroesophageal reflux disease)  . Tobacco use    Past Medical History  Diagnosis Date  . Allergic rhinitis, cause unspecified   . Hypertension   . Obesity   . Dyslipidemia   . GERD (gastroesophageal reflux disease)   . History of migraine headaches     Past Surgical History  Procedure Date  . Abdominal hysterectomy     PARTIAL (benign, for bleeding)  . Laparoscopy 12/02    ovarian cyst  . Cardiac catheterization 12/06    Dr. Clarene Duke  . Esophagogastroduodenoscopy 2009    Dr. Loreta Ave  . Colonoscopy 2009    Dr. Loreta Ave    History  Substance Use Topics  . Smoking status: Never Smoker   . Smokeless tobacco: Current User    Types: Snuff  . Alcohol Use: Yes     12 drinks per week.    Family History  Problem Relation Age of Onset  . Heart disease Mother     CHF  . Hypertension Mother   . Heart disease Father 63    MI  . Heart disease Brother   . Heart disease Maternal Aunt   . Diabetes Maternal Aunt     . Cancer Maternal Aunt     cervical  . Heart disease Maternal Uncle   . Diabetes Maternal Uncle   . Heart disease Maternal Grandmother     CHF  . Diabetes Maternal Grandmother   . Heart disease Paternal Uncle   . Heart disease Brother   . Heart disease Brother     Allergies  Allergen Reactions  . Morphine And Related Other (See Comments)    Palpitations and shaking.    Medication list has been reviewed and updated.  Current Outpatient Prescriptions on File Prior to Visit  Medication Sig Dispense Refill  . amLODipine (NORVASC) 10 MG tablet Take 1 tablet (10 mg total) by mouth daily.  90 tablet  1  . aspirin 81 MG tablet Take 81 mg by mouth daily.        . cetirizine (ZYRTEC) 10 MG tablet Take 10 mg by mouth daily.        . hydrochlorothiazide (HYDRODIURIL) 25 MG tablet Take 0.5 tablets (12.5 mg total) by mouth daily.  45 tablet  1    Review of Systems:  As per HPI- otherwise negative.   Physical Examination: Filed Vitals:  07/11/12 1200  BP: 178/92  Pulse: 76  Temp: 98.4 F (36.9 C)  Resp: 18   Filed Vitals:   07/11/12 1200  Height: 5' 2.5" (1.588 m)  Weight: 178 lb (80.74 kg)   Body mass index is 32.04 kg/(m^2). Ideal Body Weight: Weight in (lb) to have BMI = 25: 138.6   GEN: WDWN, NAD, Non-toxic, A & O x 3 HEENT: Atraumatic, Normocephalic. Neck supple. No masses, No LAD. Ears and Nose: No external deformity. CV: RRR, No M/G/R. No JVD. No thrill. No extra heart sounds. PULM: CTA B, no wheezes, crackles, rhonchi. No retractions. No resp. distress. No accessory muscle use. ABD: S, NT, ND, +BS. No rebound. No HSM. EXTR: No c/c/e NEURO Normal gait.  PSYCH: Normally interactive. Conversant. Not depressed or anxious appearing.  Calm demeanor.  Gu: external genitalia is atrophic, and there is some excoriation of the labia.  They appear "thin" and atrophic, the skin is shiny and smooth.  Vaginal canal is normal, somewhat atrophic.  No uterus or cervix. BME  normal.    Results for orders placed in visit on 07/11/12  POCT UA - MICROSCOPIC ONLY      Component Value Range   WBC, Ur, HPF, POC 2-3     RBC, urine, microscopic 0-1     Bacteria, U Microscopic trace     Mucus, UA large     Epithelial cells, urine per micros 8-9     Crystals, Ur, HPF, POC oxalate crystal     Casts, Ur, LPF, POC neg     Yeast, UA neg    POCT URINALYSIS DIPSTICK      Component Value Range   Color, UA dark yellow     Clarity, UA cloudy     Glucose, UA neg     Bilirubin, UA small     Ketones, UA trace     Spec Grav, UA 1.025     Blood, UA neg     pH, UA 6.0     Protein, UA 30     Urobilinogen, UA 2.0     Nitrite, UA neg     Leukocytes, UA Negative    POCT WET PREP WITH KOH      Component Value Range   Trichomonas, UA Negative     Clue Cells Wet Prep HPF POC 1-2     Epithelial Wet Prep HPF POC 3-5     Yeast Wet Prep HPF POC neg     Bacteria Wet Prep HPF POC 1+     RBC Wet Prep HPF POC 0-1     WBC Wet Prep HPF POC 0-2     KOH Prep POC Negative       Assessment and Plan: 1. Vaginal itching  POCT UA - Microscopic Only, POCT urinalysis dipstick, POCT Wet Prep with KOH, Urine culture  2. Vaginal atrophy  conjugated estrogens (PREMARIN) vaginal cream   Await urine culture to rule- out UTI.   Suspect that Jaanai's itching is actually due to atrophic tissues.  Discussed R/ B of topical estrogen therapy.  She does not have any history of cardiac or circulatory issues with the exception of her HTN. She would like to give premarin a try.  She will apply it three times a week for 3 weeks and give me a call with an update- Sooner if worse.    COPLAND,JESSICA, MD  07/13/12: Called her back- her urine culture was positive for GBS. I will send in an rx for penicillin.  Please come in for a recheck UA and micro in about 2 weeks- I will leave an order on her chart for a lab visit only.

## 2012-07-13 LAB — URINE CULTURE: Colony Count: 50000

## 2012-07-13 MED ORDER — PENICILLIN V POTASSIUM 500 MG PO TABS: 500.0000 mg | ORAL_TABLET | Freq: Two times a day (BID) | ORAL | Status: AC

## 2012-07-13 NOTE — Addendum Note (Signed)
Addended by: Abbe Amsterdam C on: 07/13/2012 12:57 PM   Modules accepted: Orders

## 2012-07-21 ENCOUNTER — Telehealth: Payer: Self-pay

## 2012-07-21 NOTE — Telephone Encounter (Signed)
PT STATES MEDS ARE NOT HELPING,PLEASE ADVISE   BEST PHONE [phone]  [facility] [address]

## 2012-07-24 NOTE — Telephone Encounter (Signed)
PT IS HAVING VAGINAL ITCHING AND WOULD LIKE SOMETHING FOR IT.  SHE WAS GIVEN AN ANTIBIOTIC.

## 2012-07-24 NOTE — Telephone Encounter (Signed)
The premarin will take a while to help with itching.  Pt can try some vaginal lubrication like KY jelly to see if that will decrease friction which is a big cause of itching with vaginal atrophy.  Make sure ptis not using soaps, lotions, detergents with any scents because that could be making it worse.

## 2012-07-24 NOTE — Telephone Encounter (Signed)
PT WILL TRY KY JELLY AND WILL CALL BACK IF THAT DOES NOT WORK.

## 2012-09-25 ENCOUNTER — Ambulatory Visit (INDEPENDENT_AMBULATORY_CARE_PROVIDER_SITE_OTHER): Payer: BC Managed Care – PPO | Admitting: Family Medicine

## 2012-09-25 ENCOUNTER — Ambulatory Visit: Payer: BC Managed Care – PPO

## 2012-09-25 ENCOUNTER — Other Ambulatory Visit: Payer: Self-pay | Admitting: *Deleted

## 2012-09-25 VITALS — BP 132/80 | HR 82 | Temp 98.0°F | Resp 16 | Ht 63.0 in | Wt 177.0 lb

## 2012-09-25 DIAGNOSIS — M25519 Pain in unspecified shoulder: Secondary | ICD-10-CM

## 2012-09-25 DIAGNOSIS — R21 Rash and other nonspecific skin eruption: Secondary | ICD-10-CM

## 2012-09-25 DIAGNOSIS — N760 Acute vaginitis: Secondary | ICD-10-CM

## 2012-09-25 DIAGNOSIS — I10 Essential (primary) hypertension: Secondary | ICD-10-CM

## 2012-09-25 DIAGNOSIS — N764 Abscess of vulva: Secondary | ICD-10-CM

## 2012-09-25 DIAGNOSIS — R350 Frequency of micturition: Secondary | ICD-10-CM

## 2012-09-25 LAB — POCT WET PREP WITH KOH
Clue Cells Wet Prep HPF POC: NEGATIVE
KOH Prep POC: NEGATIVE
RBC Wet Prep HPF POC: NEGATIVE
Trichomonas, UA: NEGATIVE
Yeast Wet Prep HPF POC: NEGATIVE

## 2012-09-25 LAB — POCT URINALYSIS DIPSTICK
Glucose, UA: NEGATIVE
Leukocytes, UA: NEGATIVE
Nitrite, UA: NEGATIVE
Protein, UA: 30
Spec Grav, UA: >=1.03
Urobilinogen, UA: 0.2
pH, UA: 5.5

## 2012-09-25 LAB — BASIC METABOLIC PANEL
BUN: 10 mg/dL (ref 6–23)
CO2: 28 mEq/L (ref 19–32)
Calcium: 9.1 mg/dL (ref 8.4–10.5)
Chloride: 105 mEq/L (ref 96–112)
Creat: 0.56 mg/dL (ref 0.50–1.10)
Glucose, Bld: 108 mg/dL — ABNORMAL HIGH (ref 70–99)
Potassium: 3.6 mEq/L (ref 3.5–5.3)
Sodium: 139 mEq/L (ref 135–145)

## 2012-09-25 LAB — POCT UA - MICROSCOPIC ONLY
Casts, Ur, LPF, POC: NEGATIVE
Crystals, Ur, HPF, POC: NEGATIVE
Mucus, UA: POSITIVE
Yeast, UA: NEGATIVE

## 2012-09-25 LAB — POCT GLYCOSYLATED HEMOGLOBIN (HGB A1C): Hemoglobin A1C: 5.5

## 2012-09-25 MED ORDER — SULFAMETHOXAZOLE-TRIMETHOPRIM 800-160 MG PO TABS: 1.0000 | ORAL_TABLET | Freq: Two times a day (BID) | ORAL | Status: DC

## 2012-09-25 MED ORDER — NYSTATIN-TRIAMCINOLONE 100000-0.1 UNIT/GM-% EX CREA: TOPICAL_CREAM | Freq: Three times a day (TID) | CUTANEOUS | Status: DC

## 2012-09-25 MED ORDER — MELOXICAM 7.5 MG PO TABS: 7.5000 mg | ORAL_TABLET | Freq: Every day | ORAL | Status: DC

## 2012-09-25 NOTE — Progress Notes (Signed)
[facility]:  Office Visit  Chief Complaint:  Chief Complaint  Patient presents with  . Vaginal Itching    ongoing- see previous note  . Rash    - on abdomen- has been using otc antibiotic cream  . Urinary Frequency  . Shoulder Pain    fell about 3 months ago- pain comes and goes in R shoulder    HPI: [name] is a 61 y.o. female who complains of : 1. Vaginal itching since June/July. Had UTI at that time and was put on PCN for GBS. Denies vaginal dc, she does have itching, has a h/o vaginal dryness on premarin cream. 2. Rash on stomach and vaginal area since July, use otc abx but no improvement, itches but never resolves completely. She denies HTN.  3. UTI sxs ie urinary frequency, denies abdominal pain 4. Shoulder pain -Right shoulder s/p fall 3 months ago, missed step and landed on her shoulder, still having pain. Denies numbness/tingling  Past Medical History  Diagnosis Date  . Allergic rhinitis, cause unspecified   . Hypertension   . Obesity   . Dyslipidemia   . GERD (gastroesophageal reflux disease)   . History of migraine headaches    Past Surgical History  Procedure Date  . Abdominal hysterectomy     PARTIAL (benign, for bleeding)  . Laparoscopy 12/02    ovarian cyst  . Cardiac catheterization 12/06    Dr. Clarene Duke  . Esophagogastroduodenoscopy 2009    Dr. Loreta Ave  . Colonoscopy 2009    Dr. Loreta Ave   History   Social History  . Marital Status: Married    Spouse Name: N/A    Number of Children: 3  . Years of Education: N/A   Occupational History  . 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 Smoking status: Never Smoker   . Smokeless tobacco: Current User    Types: Snuff  . Alcohol Use: Yes     12 drinks per week.  . Drug Use: No  . Sexually Active: None   Other Topics Concern  . None   Social History Narrative   Lives with husband and grandson (20 yo--lives with them since born, raised by her)    Family History  Problem Relation Age of Onset  . Heart disease Mother     CHF  . Hypertension Mother   . Heart disease Father 71    MI  . Heart disease Brother   . Heart disease Maternal Aunt   . Diabetes Maternal Aunt   . Cancer Maternal Aunt     cervical  . Heart disease Maternal Uncle   . Diabetes Maternal Uncle   . Heart disease Maternal Grandmother     CHF  . Diabetes Maternal Grandmother   . Heart disease Paternal Uncle   . Heart disease Brother   . Heart disease Brother    Allergies  Allergen Reactions  . Morphine And Related Other (See Comments)    Palpitations and shaking.   Prior to Admission medications   Medication Sig Start Date End Date Taking? Authorizing Provider  amLODipine (NORVASC) 10 MG tablet Take 1 tablet (10 mg total) by mouth daily. 04/28/12  Yes Joselyn Arrow, MD  aspirin 81 MG tablet Take 81 mg by mouth daily.     Yes Historical Provider, MD  cetirizine (ZYRTEC) 10 MG tablet Take 10 mg by mouth daily.      Historical Provider, MD  conjugated  estrogens (PREMARIN) vaginal cream Place vaginally daily. Apply 1 gram three times a week.  Taper to once or twice a week as needed 07/11/12 07/11/13  Gwenlyn Found Copland, MD  hydrochlorothiazide (HYDRODIURIL) 25 MG tablet Take 0.5 tablets (12.5 mg total) by mouth daily. 09/21/11 09/20/12  Joselyn Arrow, MD     ROS: The patient denies fevers, chills, night sweats, unintentional weight loss, chest pain, palpitations, wheezing, dyspnea on exertion, nausea, vomiting, abdominal pain, dysuria, hematuria, melena, numbness, weakness, or tingling.   All other systems have been reviewed and were otherwise negative with the exception of those mentioned in the HPI and as above.    PHYSICAL EXAM: Filed Vitals:   09/25/12 1149  BP: 132/80  Pulse: 82  Temp: 98 F (36.7 C)  Resp: 16   Filed Vitals:   09/25/12 1149  Height: 5\' 3"  (1.6 m)  Weight: 177 lb (80.287 kg)   Body mass index is 31.35 kg/(m^2).  General: Alert,  slightly tearful HEENT:  Normocephalic, atraumatic, oropharynx patent.  Cardiovascular:  Regular rate and rhythm, no rubs murmurs or gallops.  No Carotid bruits, radial pulse intact. No pedal edema.  Respiratory: Clear to auscultation bilaterally.  No wheezes, rales, or rhonchi.  No cyanosis, no use of accessory musculature GI: No organomegaly, abdomen is soft and non-tender, positive bowel sounds.  No masses. Skin: + rashes excoriated on abd and also labia,painful boil on left upper labia Neurologic: Facial musculature symmetric. Psychiatric: Patient is appropriate throughout our interaction. Lymphatic: No cervical lymphadenopathy Musculoskeletal: Gait intact. GU-no CMT, white dc, no masses, no lesions, bladder prolapse. + boil on left upper labia, + tenderness , + excoriated rash on left labia   LABS: Results for orders placed in visit on 09/25/12  POCT URINALYSIS DIPSTICK      Component Value Range   Color, UA dark yellow     Clarity, UA cloudy     Glucose, UA negative     Bilirubin, UA small     Ketones, UA trace     Spec Grav, UA >=1.030     Blood, UA trace-lysed     pH, UA 5.5     Protein, UA 30     Urobilinogen, UA 0.2     Nitrite, UA negative     Leukocytes, UA Negative    POCT UA - MICROSCOPIC ONLY      Component Value Range   WBC, Ur, HPF, POC 2-3     RBC, urine, microscopic 1-4     Bacteria, U Microscopic 2+     Mucus, UA positive     Epithelial cells, urine per micros 7-12     Crystals, Ur, HPF, POC negative     Casts, Ur, LPF, POC negative     Yeast, UA negative    POCT GLYCOSYLATED HEMOGLOBIN (HGB A1C)      Component Value Range   Hemoglobin A1C 5.5    POCT WET PREP WITH KOH      Component Value Range   Trichomonas, UA Negative     Clue Cells Wet Prep HPF POC negative     Epithelial Wet Prep HPF POC 4-9     Yeast Wet Prep HPF POC negative     Bacteria Wet Prep HPF POC 1+     RBC Wet Prep HPF POC negative     WBC Wet Prep HPF POC 0-1     KOH Prep POC  Negative       EKG/XRAY:   Primary read interpreted  by Dr. Conley Rolls at [facility]. + DJD, no fractures/subluxation   ASSESSMENT/PLAN: Encounter Diagnoses  Name Primary?  . Urinary frequency Yes  . Rash   . Vaginitis   . Shoulder pain   . HTN (hypertension)   . Boil of vulva    Rx Bactrim BID x 10 days for boil on labia.  Rx Mycolog for excoriated rash on stomach; HSV cx pending on labial rash/ulcer Rx Mobic for shoulder pain Patient is nondiabetic F/u for annual with PCP/[facility]son   Sumayyah Custodio PHUONG, DO 09/25/2012 1:16 PM

## 2012-09-28 LAB — HERPES SIMPLEX VIRUS CULTURE: Organism ID, Bacteria: NOT DETECTED

## 2012-09-29 ENCOUNTER — Encounter: Payer: Self-pay | Admitting: Family Medicine

## 2012-12-01 ENCOUNTER — Ambulatory Visit (INDEPENDENT_AMBULATORY_CARE_PROVIDER_SITE_OTHER): Payer: BC Managed Care – PPO | Admitting: Family Medicine

## 2012-12-01 VITALS — BP 120/74 | HR 83 | Temp 98.3°F | Resp 17 | Ht 62.5 in | Wt 177.0 lb

## 2012-12-01 DIAGNOSIS — L03317 Cellulitis of buttock: Secondary | ICD-10-CM

## 2012-12-01 DIAGNOSIS — M7918 Myalgia, other site: Secondary | ICD-10-CM

## 2012-12-01 DIAGNOSIS — L0231 Cutaneous abscess of buttock: Secondary | ICD-10-CM

## 2012-12-01 DIAGNOSIS — IMO0001 Reserved for inherently not codable concepts without codable children: Secondary | ICD-10-CM

## 2012-12-01 MED ORDER — DOXYCYCLINE HYCLATE 100 MG PO TABS: 100.0000 mg | ORAL_TABLET | Freq: Two times a day (BID) | ORAL | Status: DC

## 2012-12-01 NOTE — Progress Notes (Signed)
[facility] [address], [place] [address] [phone]  Date:  12/01/2012   Name:  [name]   DOB:  1951-03-23   MRN:  [id number]  PCP:  Lavonda Jumbo, MD    Chief Complaint: Leg Injury   History of Present Illness:  [name] is a 61 y.o. very pleasant female patient who presents with the following:  She is here today with 2 possible boils- they have been present for about a week.  One is on her left buttock and the other on her right labia majora.  The one on her buttock is most painful.  She also had a boil on her labia which was treated with abx (no I and D) by Dr. Conley Rolls in September.   She did have a temp of 100 degrees last night.  She took tylenol- last dose at 0400 today.  She is feeling better and no longer has a temperature No nausea or vomiting.  She has no unusual body aches- she does have OA and aches at baseline.     Patient Active Problem List  Diagnosis  . Unspecified essential hypertension  . Mixed hyperlipidemia  . GERD (gastroesophageal reflux disease)  . Tobacco use    Past Medical History  Diagnosis Date  . Allergic rhinitis, cause unspecified   . Hypertension   . Obesity   . Dyslipidemia   . GERD (gastroesophageal reflux disease)   . History of migraine headaches   . Arthritis     Past Surgical History  Procedure Date  . Abdominal hysterectomy     PARTIAL (benign, for bleeding)  . Laparoscopy 12/02    ovarian cyst  . Cardiac catheterization 12/06    Dr. Clarene Duke  . Esophagogastroduodenoscopy 2009    Dr. Loreta Ave  . Colonoscopy 2009    Dr. Loreta Ave    History  Substance Use Topics  . Smoking status: Never Smoker   . Smokeless tobacco: Current User    Types: Snuff  . Alcohol Use: 1.8 oz/week    3 Glasses of wine per week     Comment: 12 drinks per week.    Family History  Problem Relation Age of Onset  . Heart disease Mother     CHF  . Hypertension Mother   . Heart disease Father 71    MI  . Heart  disease Brother   . Heart disease Maternal Aunt   . Diabetes Maternal Aunt   . Cancer Maternal Aunt     cervical  . Heart disease Maternal Uncle   . Diabetes Maternal Uncle   . Heart disease Maternal Grandmother     CHF  . Diabetes Maternal Grandmother   . Heart disease Paternal Uncle   . Heart disease Brother   . Heart disease Brother     Allergies  Allergen Reactions  . Morphine And Related Other (See Comments)    Palpitations and shaking.    Medication list has been reviewed and updated.  Current Outpatient Prescriptions on File Prior to Visit  Medication Sig Dispense Refill  . aspirin 81 MG tablet Take 81 mg by mouth daily.        . cetirizine (ZYRTEC) 10 MG tablet Take 10 mg by mouth daily.        Marland Kitchen conjugated estrogens (PREMARIN) vaginal cream Place vaginally daily. Apply 1 gram three times a week.  Taper to once or twice a week as needed  42.5 g  12  .  amLODipine (NORVASC) 10 MG tablet Take 1 tablet (10 mg total) by mouth daily.  90 tablet  1  . hydrochlorothiazide (HYDRODIURIL) 25 MG tablet Take 0.5 tablets (12.5 mg total) by mouth daily.  45 tablet  1  . meloxicam (MOBIC) 7.5 MG tablet Take 1 tablet (7.5 mg total) by mouth daily.  30 tablet  0  . nystatin-triamcinolone (MYCOLOG II) cream Apply topically 3 (three) times daily. Do not use more than two weeks  30 g  0  . sulfamethoxazole-trimethoprim (BACTRIM DS,SEPTRA DS) 800-160 MG per tablet Take 1 tablet by mouth 2 (two) times daily.  20 tablet  0    Review of Systems:  As per HPI- otherwise negative.   Physical Examination: Filed Vitals:   12/01/12 0856  BP: 120/74  Pulse: 83  Temp: 98.3 F (36.8 C)  Resp: 17   Filed Vitals:   12/01/12 0856  Height: 5' 2.5" (1.588 m)  Weight: 177 lb (80.287 kg)   Body mass index is 31.86 kg/(m^2). Ideal Body Weight: Weight in (lb) to have BMI = 25: 138.6   GEN: WDWN, NAD, Non-toxic, A & O x 3, overweight HEENT: Atraumatic, Normocephalic. Neck supple. No masses, No  LAD. Ears and Nose: No external deformity. CV: RRR, No M/G/R. No JVD. No thrill. No extra heart sounds. PULM: CTA B, no wheezes, crackles, rhonchi. No retractions. No resp. distress. No accessory muscle use. EXTR: No c/c/e NEURO Normal gait.  PSYCH: Normally interactive. Conversant. Not depressed or anxious appearing.  Calm demeanor.  Left buttock near genital area: there is a firm, indurated area consistent with a small abscess.   Right labia majora shows a small, tender area also consistent with a tiny abscess- does not have any fluctuance or need of I and D at this time   VC obtained.  Area on left buttock cleaned with alcohol and anesthesia preformed with 2ml of 1% lidocaine with epi.  Used betadine to prep area, small incision made with 11 blade- pus expressed and cultured.  Used forceps to explore wound but no large pocket found.  All pus expressed- a small amount, packing not needed.  Dressed with a bandage  Assessment and Plan: 1. Buttock pain  Wound culture  2. Cellulitis of buttock  doxycycline (VIBRA-TABS) 100 MG tablet   Recurrent genital/ groin abscesses.  Treated with I and D, doxycycline today.  Await culture- if MRSA will try Hibiclens and mupriocin in the nose.  In the meantime asked her to let us know right away if she has any more fevers or feels bad overall.  The abscess on her buttock was quite small and I do not think it is causing systemic illness, but cautioned her to follow- up closely.     Abbe Amsterdam, MD

## 2012-12-01 NOTE — Patient Instructions (Addendum)
Use the doxycycline antibiotic twice a day.  Take a couple of warm soaks in the next few days to drain any other pus that may form- otherwise keep the area covered with a bandage if it is draining.  I will let you know the results of your wound culture as soon as it comes in.  If you have more fevers or flu like symptoms please let me know right away

## 2012-12-03 ENCOUNTER — Encounter: Payer: Self-pay | Admitting: Family Medicine

## 2012-12-03 LAB — WOUND CULTURE
Gram Stain: NONE SEEN
Gram Stain: NONE SEEN

## 2012-12-03 MED ORDER — MUPIROCIN 2 % EX OINT: TOPICAL_OINTMENT | CUTANEOUS | Status: DC

## 2012-12-03 NOTE — Addendum Note (Signed)
Addended by: Abbe Amsterdam C on: 12/03/2012 02:43 PM   Modules accepted: Orders

## 2012-12-06 ENCOUNTER — Telehealth: Payer: Self-pay | Admitting: Family Medicine

## 2012-12-06 DIAGNOSIS — I10 Essential (primary) hypertension: Secondary | ICD-10-CM

## 2012-12-06 MED ORDER — AMLODIPINE BESYLATE 10 MG PO TABS: 10.0000 mg | ORAL_TABLET | Freq: Every day | ORAL | Status: DC

## 2012-12-06 NOTE — Telephone Encounter (Signed)
Rx for # 30 sent to pharmacy

## 2012-12-19 ENCOUNTER — Encounter: Payer: BC Managed Care – PPO | Admitting: Family Medicine

## 2013-01-02 ENCOUNTER — Ambulatory Visit: Payer: BC Managed Care – PPO

## 2013-01-02 ENCOUNTER — Ambulatory Visit (INDEPENDENT_AMBULATORY_CARE_PROVIDER_SITE_OTHER): Payer: BC Managed Care – PPO | Admitting: Family Medicine

## 2013-01-02 VITALS — BP 109/67 | HR 86 | Temp 100.2°F | Resp 17 | Ht 62.5 in | Wt 181.0 lb

## 2013-01-02 DIAGNOSIS — R059 Cough, unspecified: Secondary | ICD-10-CM

## 2013-01-02 DIAGNOSIS — R05 Cough: Secondary | ICD-10-CM

## 2013-01-02 DIAGNOSIS — R509 Fever, unspecified: Secondary | ICD-10-CM

## 2013-01-02 LAB — POCT INFLUENZA A/B
Influenza A, POC: NEGATIVE
Influenza B, POC: NEGATIVE

## 2013-01-02 MED ORDER — CEFDINIR 300 MG PO CAPS: 300.0000 mg | ORAL_CAPSULE | Freq: Two times a day (BID) | ORAL | Status: DC

## 2013-01-02 MED ORDER — OSELTAMIVIR PHOSPHATE 75 MG PO CAPS: 75.0000 mg | ORAL_CAPSULE | Freq: Two times a day (BID) | ORAL | Status: DC

## 2013-01-02 NOTE — Patient Instructions (Addendum)
Take the medications as directed.  If you are not better within 48 hours please come back in- Sooner if worse.   Drink plenty of fluids and rest

## 2013-01-02 NOTE — Progress Notes (Signed)
[facility] [address], [place] [address] [phone]  Date:  01/02/2013   Name:  [name]   DOB:  05/16/1951   MRN:  [id number]  PCP:  Lavonda Jumbo, MD    Chief Complaint: Cough, Nasal Congestion, Sore Throat and Chest Pain   History of Present Illness:  [name] is a 62 y.o. very pleasant female patient who presents with the following:  She noted onset of illness yesterday- she noted ST, fever, body aches, chills, and a cough.  Temp up to about 101.   She has felt nauseated but did not vomit or have diarrhea.    She does have a runny nose.  Feels like she has the flu.  She does have some anterior chest pain when she coughs.  Feels like she has "pulled a muscle" by coughing so much.  Otherwise not having any CP or SOB   Patient Active Problem List  Diagnosis  . Unspecified essential hypertension  . Mixed hyperlipidemia  . GERD (gastroesophageal reflux disease)  . Tobacco use    Past Medical History  Diagnosis Date  . Allergic rhinitis, cause unspecified   . Hypertension   . Obesity   . Dyslipidemia   . GERD (gastroesophageal reflux disease)   . History of migraine headaches   . Arthritis     Past Surgical History  Procedure Date  . Abdominal hysterectomy     PARTIAL (benign, for bleeding)  . Laparoscopy 12/02    ovarian cyst  . Cardiac catheterization 12/06    Dr. Clarene Duke  . Esophagogastroduodenoscopy 2009    Dr. Loreta Ave  . Colonoscopy 2009    Dr. Loreta Ave    History  Substance Use Topics  . Smoking status: Never Smoker   . Smokeless tobacco: Current User    Types: Snuff  . Alcohol Use: 1.8 oz/week    3 Glasses of wine per week     Comment: 12 drinks per week.    Family History  Problem Relation Age of Onset  . Heart disease Mother     CHF  . Hypertension Mother   . Heart disease Father 59    MI  . Heart disease Brother   . Heart disease Maternal Aunt   . Diabetes Maternal Aunt   . Cancer Maternal Aunt    cervical  . Heart disease Maternal Uncle   . Diabetes Maternal Uncle   . Heart disease Maternal Grandmother     CHF  . Diabetes Maternal Grandmother   . Heart disease Paternal Uncle   . Heart disease Brother   . Heart disease Brother     Allergies  Allergen Reactions  . Morphine And Related Other (See Comments)    Palpitations and shaking.    Medication list has been reviewed and updated.  Current Outpatient Prescriptions on File Prior to Visit  Medication Sig Dispense Refill  . amLODipine (NORVASC) 10 MG tablet Take 1 tablet (10 mg total) by mouth daily.  30 tablet  0  . aspirin 81 MG tablet Take 81 mg by mouth daily.        . cetirizine (ZYRTEC) 10 MG tablet Take 10 mg by mouth daily.        Marland Kitchen conjugated estrogens (PREMARIN) vaginal cream Place vaginally daily. Apply 1 gram three times a week.  Taper to once or twice a week as needed  42.5 g  12  . doxycycline (VIBRA-TABS) 100 MG tablet Take 1 tablet (100  mg total) by mouth 2 (two) times daily.  20 tablet  0  . hydrochlorothiazide (HYDRODIURIL) 25 MG tablet Take 0.5 tablets (12.5 mg total) by mouth daily.  45 tablet  1  . meloxicam (MOBIC) 7.5 MG tablet Take 1 tablet (7.5 mg total) by mouth daily.  30 tablet  0  . mupirocin ointment (BACTROBAN) 2 % Apply a small amount in each nostril twice a day and gently massage.  Use for 5 days.  22 g  0  . nystatin-triamcinolone (MYCOLOG II) cream Apply topically 3 (three) times daily. Do not use more than two weeks  30 g  0    Review of Systems:  As per HPI- otherwise negative.   Physical Examination: Filed Vitals:   01/02/13 0958  BP: 109/67  Pulse: 86  Temp: 100.2 F (37.9 C)  Resp: 17   Filed Vitals:   01/02/13 0958  Height: 5' 2.5" (1.588 m)  Weight: 181 lb (82.101 kg)   Body mass index is 32.58 kg/(m^2). Ideal Body Weight: Weight in (lb) to have BMI = 25: 138.6   GEN: WDWN, NAD, Non-toxic, A & O x 3, overweight HEENT: Atraumatic, Normocephalic. Neck supple. No  masses, No LAD.  Bilateral TM wnl, oropharynx normal.  PEERL,EOMI.   Ears and Nose: No external deformity. CV: RRR, No M/G/R. No JVD. No thrill. No extra heart sounds. PULM: CTA B, no wheezes, crackles, rhonchi. No retractions. No resp. distress. No accessory muscle use. ABD: S, NT, ND, +BS. No rebound. No HSM. EXTR: No c/c/e NEURO Normal gait.  PSYCH: Normally interactive. Conversant. Not depressed or anxious appearing.  Calm demeanor.   Results for orders placed in visit on 01/02/13  POCT INFLUENZA A/B      Component Value Range   Influenza A, POC Negative     Influenza B, POC Negative     [facility] reading (PRIMARY) by  Dr. Patsy Lager. CXR: retrocardiac/ RLL pneumonia.    CHEST - 2 VIEW  Comparison: Chest x-ray of 12/01/2005  Findings: No pneumonia or effusion is seen. There is some  peribronchial thickening which may indicate bronchitis. The heart  is mildly enlarged and stable. No acute bony abnormality is seen.  IMPRESSION:  No pneumonia. Peribronchial thickening may indicate bronchitis.  Assessment and Plan: 1. Cough  POCT Influenza A/B, DG Chest 2 View  2. Fever  oseltamivir (TAMIFLU) 75 MG capsule, cefdinir (OMNICEF) 300 MG capsule   Arilyn is here today with typical flu symptom, but her flu test is negative.  Will cover for both flu and pneumonia with tamiflu and omnicef.  She will follow- up closely if not better- Sooner if worse.     Abbe Amsterdam, MD

## 2013-01-17 ENCOUNTER — Encounter: Payer: BC Managed Care – PPO | Admitting: Family Medicine

## 2013-01-20 ENCOUNTER — Encounter: Payer: Self-pay | Admitting: Medical

## 2013-01-20 ENCOUNTER — Ambulatory Visit (INDEPENDENT_AMBULATORY_CARE_PROVIDER_SITE_OTHER): Payer: BC Managed Care – PPO | Admitting: Medical

## 2013-01-20 ENCOUNTER — Telehealth: Payer: Self-pay | Admitting: Family Medicine

## 2013-01-20 VITALS — BP 148/82 | HR 65 | Temp 98.1°F | Resp 16 | Wt 176.0 lb

## 2013-01-20 DIAGNOSIS — I10 Essential (primary) hypertension: Secondary | ICD-10-CM

## 2013-01-20 DIAGNOSIS — Z1239 Encounter for other screening for malignant neoplasm of breast: Secondary | ICD-10-CM

## 2013-01-20 DIAGNOSIS — F17200 Nicotine dependence, unspecified, uncomplicated: Secondary | ICD-10-CM

## 2013-01-20 MED ORDER — AMLODIPINE BESYLATE 10 MG PO TABS: 10.0000 mg | ORAL_TABLET | Freq: Every day | ORAL | Status: DC

## 2013-01-20 MED ORDER — HYDROCHLOROTHIAZIDE 12.5 MG PO TABS: 12.5000 mg | ORAL_TABLET | Freq: Every day | ORAL | Status: DC

## 2013-01-20 NOTE — Telephone Encounter (Signed)
Patient is aware of her appointment at [facility] for a mammogram on 01/23/13 @ 400 pm. [phone] CLS

## 2013-01-20 NOTE — Progress Notes (Signed)
Subjective: Here for recheck.  Compliant with Norvasc 10mg  daily.  Checks BPs some, and routinely gets 140/90s.  She ended up stopping HCTZ on her on, thought her BP was fine without the HCTZ.   Exercises some with walking.  She is concerned about mammogram.  Last mammogram at least 3 years ago.  She dips tobacco, but husband smokes.  Not currently on lipid medication.  No other new c/o.    Past Medical History  Diagnosis Date  . Allergic rhinitis, cause unspecified   . Hypertension   . Obesity   . Dyslipidemia   . GERD (gastroesophageal reflux disease)   . History of migraine headaches   . Arthritis     Objective: Filed Vitals:   01/20/13 1402  BP: 148/82  Pulse: 65  Temp: 98.1 F (36.7 C)  Resp: 16    General appearance: alert, no distress, WD/WN Oral cavity: MMM, no lesions Neck: supple, no lymphadenopathy, no thyromegaly, no masses Heart: RRR, normal S1, S2, no murmurs Lungs: CTA bilaterally, no wheezes, rhonchi, or rales Abdomen: +bs, soft, non tender, non distended, no masses, no hepatomegaly, no splenomegaly Pulses: 2+ symmetric, upper and lower extremities, normal cap refill  Assessment: Encounter Diagnoses  Name Primary?  Marland Kitchen Unspecified essential hypertension Yes  . Tobacco use disorder   . Screening for breast cancer    Plan: Hypertension - c/t Norvasc 10mg  daily, restart HCTZ 12.5mg  daily, recheck 80mo.  Return soon for fasting labs  Tobacco use - advised cessation, discussed risks.  Screen for breast cancer - will refer for mammogram.  Return for breast/pelvic exam  Follow-up pending lab recheck

## 2013-01-20 NOTE — Patient Instructions (Addendum)
Continue Norvasc 10mg  daily  Begin HCTZ/Hydrochlorothiazide 12.5mg  daily  Try and get exercise such as walking regularly  Eat a healthy low fat diet.  Consider you and husband thinking how life would be different without tobacco.  Begin OTC fish oil tablets twice daily.

## 2013-01-23 LAB — HM MAMMOGRAPHY: HM Mammogram: NEGATIVE

## 2013-02-14 ENCOUNTER — Encounter: Payer: Self-pay | Admitting: Family Medicine

## 2013-06-13 ENCOUNTER — Other Ambulatory Visit (INDEPENDENT_AMBULATORY_CARE_PROVIDER_SITE_OTHER): Payer: BC Managed Care – PPO

## 2013-06-13 DIAGNOSIS — I10 Essential (primary) hypertension: Secondary | ICD-10-CM

## 2013-06-13 LAB — POCT URINALYSIS DIPSTICK
Bilirubin, UA: NEGATIVE
Glucose, UA: NEGATIVE
Ketones, UA: NEGATIVE
Nitrite, UA: NEGATIVE
Spec Grav, UA: 1.025
Urobilinogen, UA: NEGATIVE
pH, UA: 5

## 2013-06-13 LAB — CBC WITH DIFFERENTIAL/PLATELET
Basophils Absolute: 0 10*3/uL (ref 0.0–0.1)
Basophils Relative: 0 % (ref 0–1)
Eosinophils Absolute: 0.1 10*3/uL (ref 0.0–0.7)
Eosinophils Relative: 2 % (ref 0–5)
HCT: 39 % (ref 36.0–46.0)
Hemoglobin: 13.4 g/dL (ref 12.0–15.0)
Lymphocytes Relative: 29 % (ref 12–46)
Lymphs Abs: 1.7 10*3/uL (ref 0.7–4.0)
MCH: 30.5 pg (ref 26.0–34.0)
MCHC: 34.4 g/dL (ref 30.0–36.0)
MCV: 88.8 fL (ref 78.0–100.0)
Monocytes Absolute: 0.5 10*3/uL (ref 0.1–1.0)
Monocytes Relative: 8 % (ref 3–12)
Neutro Abs: 3.6 10*3/uL (ref 1.7–7.7)
Neutrophils Relative %: 61 % (ref 43–77)
Platelets: 198 10*3/uL (ref 150–400)
RBC: 4.39 MIL/uL (ref 3.87–5.11)
RDW: 13.8 % (ref 11.5–15.5)
WBC: 5.8 10*3/uL (ref 4.0–10.5)

## 2013-06-13 NOTE — Addendum Note (Signed)
Addended by: Janeice Robinson on: 06/13/2013 05:10 PM   Modules accepted: Orders

## 2013-06-14 LAB — LIPID PANEL
Cholesterol: 184 mg/dL (ref 0–200)
HDL: 45 mg/dL (ref >39)
LDL Cholesterol: 90 mg/dL (ref 0–99)
Total CHOL/HDL Ratio: 4.1 Ratio
Triglycerides: 244 mg/dL — ABNORMAL HIGH (ref <150)
VLDL: 49 mg/dL — ABNORMAL HIGH (ref 0–40)

## 2013-06-14 LAB — COMPREHENSIVE METABOLIC PANEL
ALT: 13 U/L (ref 0–35)
AST: 16 U/L (ref 0–37)
Albumin: 4 g/dL (ref 3.5–5.2)
Alkaline Phosphatase: 70 U/L (ref 39–117)
BUN: 9 mg/dL (ref 6–23)
CO2: 25 mEq/L (ref 19–32)
Calcium: 8.8 mg/dL (ref 8.4–10.5)
Chloride: 104 mEq/L (ref 96–112)
Creat: 0.55 mg/dL (ref 0.50–1.10)
Glucose, Bld: 102 mg/dL — ABNORMAL HIGH (ref 70–99)
Potassium: 3.9 mEq/L (ref 3.5–5.3)
Sodium: 141 mEq/L (ref 135–145)
Total Bilirubin: 0.8 mg/dL (ref 0.3–1.2)
Total Protein: 7.3 g/dL (ref 6.0–8.3)

## 2013-07-11 ENCOUNTER — Ambulatory Visit (INDEPENDENT_AMBULATORY_CARE_PROVIDER_SITE_OTHER): Payer: BC Managed Care – PPO | Admitting: Medical

## 2013-07-11 ENCOUNTER — Encounter: Payer: Self-pay | Admitting: Medical

## 2013-07-11 VITALS — BP 132/80 | HR 60 | Temp 98.0°F | Resp 16 | Wt 176.0 lb

## 2013-07-11 DIAGNOSIS — R7301 Impaired fasting glucose: Secondary | ICD-10-CM

## 2013-07-11 DIAGNOSIS — E669 Obesity, unspecified: Secondary | ICD-10-CM

## 2013-07-11 DIAGNOSIS — I10 Essential (primary) hypertension: Secondary | ICD-10-CM

## 2013-07-11 DIAGNOSIS — E781 Pure hyperglyceridemia: Secondary | ICD-10-CM

## 2013-07-11 LAB — POCT GLYCOSYLATED HEMOGLOBIN (HGB A1C): Hemoglobin A1C: 5.8

## 2013-07-11 MED ORDER — AMLODIPINE BESYLATE 10 MG PO TABS: 10.0000 mg | ORAL_TABLET | Freq: Every day | ORAL | Status: DC

## 2013-07-11 NOTE — Patient Instructions (Signed)
1800 Calorie Diet for Diabetes Meal Planning The 1800 calorie diet is designed for eating up to 1800 calories each day. Following this diet and making healthy meal choices can help improve overall health. This diet controls blood sugar (glucose) levels and can also help lower blood pressure and cholesterol. SERVING SIZES Measuring foods and serving sizes helps to make sure you are getting the right amount of food. The list below tells how big or small some common serving sizes are:  1 oz.........4 stacked dice.  3 oz.........Deck of cards.  1 tsp........Tip of little finger.  1 tbs........Thumb.  2 tbs........Golf ball.   cup.......Half of a fist.  1 cup........A fist. GUIDELINES FOR CHOOSING FOODS The goal of this diet is to eat a variety of foods and limit calories to 1800 each day. This can be done by choosing foods that are low in calories and fat. The diet also suggests eating small amounts of food frequently. Doing this helps control your blood glucose levels so they do not get too high or too low. Each meal or snack may include a protein food source to help you feel more satisfied and to stabilize your blood glucose. Try to eat about the same amount of food around the same time each day. This includes weekend days, travel days, and days off work. Space your meals about 4 to 5 hours apart and add a snack between them if you wish.  For example, a daily food plan could include breakfast, a morning snack, lunch, dinner, and an evening snack. Healthy meals and snacks include whole grains, vegetables, fruits, lean meats, poultry, fish, and dairy products. As you plan your meals, select a variety of foods. Choose from the bread and starch, vegetable, fruit, dairy, and meat/protein groups. Examples of foods from each group and their suggested serving sizes are listed below. Use measuring cups and spoons to become familiar with what a healthy portion looks like. Bread and Starch Each serving  equals 15 grams of carbohydrates.  1 slice bread.   bagel.   cup cold cereal (unsweetened).   cup hot cereal or mashed potatoes.  1 small potato (size of a computer mouse).   cup cooked pasta or rice.   English muffin.  1 cup broth-based soup.  3 cups of popcorn.  4 to 6 whole-wheat crackers.   cup cooked beans, peas, or corn. Vegetable Each serving equals 5 grams of carbohydrates.   cup cooked vegetables.  1 cup raw vegetables.   cup tomato or vegetable juice. Fruit Each serving equals 15 grams of carbohydrates.  1 small apple or orange.  1 cup watermelon or strawberries.   cup applesauce (no sugar added).  2 tbs raisins.   banana.   cup canned fruit, packed in water, its own juice, or sweetened with a sugar substitute.   cup unsweetened fruit juice. Dairy Each serving equals 12 to 15 grams of carbohydrates.  1 cup fat-free milk.  6 oz artificially sweetened yogurt or plain yogurt.  1 cup low-fat buttermilk.  1 cup soy milk.  1 cup almond milk. Meat/Protein  1 large egg.  2 to 3 oz meat, poultry, or fish.   cup low-fat cottage cheese.  1 tbs peanut butter.  1 oz low-fat cheese.   cup tuna in water.   cup tofu. Fat  1 tsp oil.  1 tsp trans-fat-free margarine.  1 tsp butter.  1 tsp mayonnaise.  2 tbs avocado.  1 tbs salad dressing.  1 tbs cream cheese.    2 tbs sour cream. SAMPLE 1800 CALORIE DIET PLAN Breakfast   cup unsweetened cereal (1 carb serving).  1 cup fat-free milk (1 carb serving).  1 slice whole-wheat toast (1 carb serving).   small banana (1 carb serving).  1 scrambled egg.  1 tsp trans-fat-free margarine. Lunch  Tuna sandwich.  2 slices whole-wheat bread (2 carb servings).   cup canned tuna in water, drained.  1 tbs reduced fat mayonnaise.  1 stalk celery, chopped.  2 slices tomato.  1 lettuce leaf.  1 cup carrot sticks.  24 to 30 seedless grapes (2 carb  servings).  6 oz light yogurt (1 carb serving). Afternoon Snack  3 graham cracker squares (1 carb serving).  Fat-free milk, 1 cup (1 carb serving).  1 tbs peanut butter. Dinner  3 oz salmon, broiled with 1 tsp oil.  1 cup mashed potatoes (2 carb servings) with 1 tsp trans-fat-free margarine.  1 cup fresh or frozen green beans.  1 cup steamed asparagus.  1 cup fat-free milk (1 carb serving). Evening Snack  3 cups air-popped popcorn (1 carb serving).  2 tbs parmesan cheese sprinkled on top. MEAL PLAN Use this worksheet to help you make a daily meal plan based on the 1800 calorie diet suggestions. If you are using this plan to help you control your blood glucose, you may interchange carbohydrate-containing foods (dairy, starches, and fruits). Select a variety of fresh foods of varying colors and flavors. The total amount of carbohydrate in your meals or snacks is more important than making sure you include all of the food groups every time you eat. Choose from the following foods to build your day's meals:  8 Starches.  4 Vegetables.  3 Fruits.  2 Dairy.  6 to 7 oz Meat/Protein.  Up to 4 Fats. Your dietician can use this worksheet to help you decide how many servings and which types of foods are right for you. BREAKFAST Food Group and Servings / Food Choice Starch ________________________________________________________ Dairy _________________________________________________________ Fruit _________________________________________________________ Meat/Protein __________________________________________________ Fat ___________________________________________________________ LUNCH Food Group and Servings / Food Choice Starch ________________________________________________________ Meat/Protein __________________________________________________ Vegetable _____________________________________________________ Fruit  _________________________________________________________ Dairy _________________________________________________________ Fat ___________________________________________________________ AFTERNOON SNACK Food Group and Servings / Food Choice Starch ________________________________________________________ Meat/Protein __________________________________________________ Fruit __________________________________________________________ Dairy _________________________________________________________ DINNER Food Group and Servings / Food Choice Starch _________________________________________________________ Meat/Protein ___________________________________________________ Dairy __________________________________________________________ Vegetable ______________________________________________________ Fruit ___________________________________________________________ Fat ____________________________________________________________ EVENING SNACK Food Group and Servings / Food Choice Fruit __________________________________________________________ Meat/Protein ___________________________________________________ Dairy __________________________________________________________ Starch _________________________________________________________ DAILY TOTALS Starch ____________________________ Vegetable _________________________ Fruit _____________________________ Dairy _____________________________ Meat/Protein______________________ Fat _______________________________ Document Released: 07/06/2005 Document Revised: 03/07/2012 Document Reviewed: 10/30/2011 Exit[employer] Patient Information 2014 [employer].  

## 2013-07-11 NOTE — Progress Notes (Signed)
Subjective: Taking Norvasc 10mg  daily, not taking HCTZ.  When taking both together, gets dizzy.   Doesn't add salt.  Since her husband's recent diabetes diagnosis, they have been making healthy diet changes, trying to get some exercise.   Otherwise feels fine, in normal state of health.   Has questions about carbs, diet.   No other new c/o.    Past Medical History  Diagnosis Date  . Allergic rhinitis, cause unspecified   . Hypertension   . Obesity   . Dyslipidemia   . GERD (gastroesophageal reflux disease)   . History of migraine headaches   . Arthritis     Objective: Filed Vitals:   07/11/13 0824  BP: 132/80  Pulse: 60  Temp: 98 F (36.7 C)  Resp: 16    General appearance: alert, no distress, WD/WN Heart: RRR, normal S1, S2, no murmurs Lungs: CTA bilaterally, no wheezes, rhonchi, or rales Pulses: 2+ symmetric, upper and lower extremities, normal cap refill  Assessment: Encounter Diagnoses  Name Primary?  Marland Kitchen Unspecified essential hypertension Yes  . Impaired fasting blood sugar   . Hypertriglyceridemia   . Obesity    Plan: Hypertension - c/t Norvasc 10mg  daily, check BPs 3 times per week, and bring back BP log Impaired fasting glucose - HgbA1C of 5.8% today.  Spent majority of visit discussing diet, carb counting, meal planning to help with triglycerides and sugars Hypertriglyceridemia - she is taking 2 fish oil gummies daily, work on diet change Obesity - lifestyle changes advised

## 2013-10-10 ENCOUNTER — Ambulatory Visit: Payer: BC Managed Care – PPO | Admitting: Medical

## 2014-07-10 ENCOUNTER — Other Ambulatory Visit: Payer: Self-pay | Admitting: Medical

## 2014-07-10 ENCOUNTER — Encounter: Payer: Self-pay | Admitting: Medical

## 2014-07-10 ENCOUNTER — Ambulatory Visit
Admission: RE | Admit: 2014-07-10 | Discharge: 2014-07-10 | Disposition: A | Discharge status: Home / Self Care | Payer: BC Managed Care – PPO | Source: Ambulatory Visit | Attending: Medical | Admitting: Medical

## 2014-07-10 ENCOUNTER — Ambulatory Visit (INDEPENDENT_AMBULATORY_CARE_PROVIDER_SITE_OTHER): Payer: BC Managed Care – PPO | Admitting: Medical

## 2014-07-10 VITALS — BP 130/80 | HR 60 | Temp 98.1°F | Resp 14 | Ht 62.0 in | Wt 176.0 lb

## 2014-07-10 DIAGNOSIS — Z9189 Other specified personal risk factors, not elsewhere classified: Secondary | ICD-10-CM

## 2014-07-10 DIAGNOSIS — Z7722 Contact with and (suspected) exposure to environmental tobacco smoke (acute) (chronic): Secondary | ICD-10-CM

## 2014-07-10 DIAGNOSIS — F17200 Nicotine dependence, unspecified, uncomplicated: Secondary | ICD-10-CM

## 2014-07-10 DIAGNOSIS — R011 Cardiac murmur, unspecified: Secondary | ICD-10-CM

## 2014-07-10 DIAGNOSIS — R059 Cough, unspecified: Secondary | ICD-10-CM

## 2014-07-10 DIAGNOSIS — R05 Cough: Secondary | ICD-10-CM

## 2014-07-10 DIAGNOSIS — I10 Essential (primary) hypertension: Secondary | ICD-10-CM

## 2014-07-10 DIAGNOSIS — Z720 Tobacco use: Secondary | ICD-10-CM

## 2014-07-10 MED ORDER — ALBUTEROL SULFATE HFA 108 (90 BASE) MCG/ACT IN AERS: 2.0000 | INHALATION_SPRAY | Freq: Four times a day (QID) | RESPIRATORY_TRACT | Status: DC | PRN

## 2014-07-10 NOTE — Progress Notes (Signed)
   Subjective:    Patient ID: [name], female    DOB: Dec 25, 1951, 63 y.o.   MRN: [id number]  HPI  Patient is presenting for a persistent cough since April. Back in April, patient reports she had a really bad cold. Robitussin and Tylenol which helped with all her symptoms except her cough. Her cough has been getting worse, bothers her more at night when she lays down, has been productive with clear sputum. Feels like the cough is coming more from her chest. Admits some chest tightness, some sweating every night, hoarseness, right ear pressure, congestion, occasional sob and wheezing mostly associated with coughing fits. She has taken Delsym, Zyrtec, occasionally uses robitussin without any relief. Denies any recent fevers, hemoptysis, n/v, chest pain, abdominal pain, throat pain, sinus pain, no worse leg swelling than mild ankle edema in the evenings which is routine for her. Denies ever smoking but does dip snuff, husband is a heavy smoker, smokes in the house. Drinks up to 2-3 light beers on some days. She has been treated for bronchitis a couple of years ago. No history of pneumonia, tb.   Review of Systems As in subjective.    Objective:   Physical Exam  BP 130/80  Pulse 60  Temp(Src) 98.1 F (36.7 C)  Resp 14  Ht 5\' 2"  (1.575 m)  Wt 176 lb (79.833 kg)  BMI 32.18 kg/m2   General appearance: alert, no distress, WD/WN, obese HEENT: normocephalic, sclerae anicteric, TMs with slight serous effusion bilat, nares patent, no discharge or erythema, pharynx normal Oral cavity: MMM, no lesions Neck: supple, no lymphadenopathy, no thyromegaly, no masses, no JVD Heart: brief 2/6 systolic murmur heard best in right upper sternal border, otherwise RRR, normal S1, S2 Lungs: lung sounds coarse throughout but no wheezes, rhonchi, or rales Pulses: 2+ symmetric, upper and lower extremities, normal cap refill Ext: no edema Abdomen: nontender, no mass, no organomegaly      Assessment &  Plan:   Encounter Diagnoses  Name Primary?  . Cough Yes  . Second hand smoke exposure   . Essential hypertension, benign   . Heart murmur    Cough-Discussed possible causes, suspect bronchitis.  Given her prolonged cough for months and secondhand smoke exposure, referral for chest x-ray  Hypertension-continue routine medication return soon for physical  Heart murmur-unchanged from prior, although I apparently overlooked this on last visit.  Reviewing prior numerous records the murmur has been prior and appears to be unchanged.  Prior cardiac cath in 2006 with normal vessels.  We did talk about having her returning soon for a physical and moving forward with updated echocardiogram and potential other cardiac evaluation, particularly in light of long-term secondhand smoke exposure, long-term hypertension diagnoses, smokeless tobacco use  Patient was seen in conjunction with PA student Wallis BambergMario Mani, and I have also evaluated and examined patient, agree with student's notes, student supervised by me.

## 2014-07-31 ENCOUNTER — Other Ambulatory Visit: Payer: Self-pay | Admitting: Medical

## 2014-09-03 ENCOUNTER — Other Ambulatory Visit: Payer: Self-pay | Admitting: Medical

## 2014-10-08 ENCOUNTER — Encounter: Payer: BC Managed Care – PPO | Admitting: Medical

## 2014-10-22 ENCOUNTER — Ambulatory Visit (INDEPENDENT_AMBULATORY_CARE_PROVIDER_SITE_OTHER): Payer: BC Managed Care – PPO | Admitting: Medical

## 2014-10-22 ENCOUNTER — Encounter: Payer: Self-pay | Admitting: Medical

## 2014-10-22 ENCOUNTER — Telehealth: Payer: Self-pay | Admitting: Medical

## 2014-10-22 VITALS — BP 132/80 | HR 78 | Temp 98.1°F | Resp 16 | Wt 174.0 lb

## 2014-10-22 DIAGNOSIS — K141 Geographic tongue: Secondary | ICD-10-CM

## 2014-10-22 DIAGNOSIS — Z720 Tobacco use: Secondary | ICD-10-CM

## 2014-10-22 DIAGNOSIS — Z7722 Contact with and (suspected) exposure to environmental tobacco smoke (acute) (chronic): Secondary | ICD-10-CM

## 2014-10-22 DIAGNOSIS — I10 Essential (primary) hypertension: Secondary | ICD-10-CM

## 2014-10-22 DIAGNOSIS — T148 Other injury of unspecified body region: Secondary | ICD-10-CM

## 2014-10-22 DIAGNOSIS — R011 Cardiac murmur, unspecified: Secondary | ICD-10-CM

## 2014-10-22 DIAGNOSIS — T148XXA Other injury of unspecified body region, initial encounter: Secondary | ICD-10-CM

## 2014-10-22 LAB — CBC
HCT: 40.3 % (ref 36.0–46.0)
Hemoglobin: 13.8 g/dL (ref 12.0–15.0)
MCH: 30.8 pg (ref 26.0–34.0)
MCHC: 34.2 g/dL (ref 30.0–36.0)
MCV: 90 fL (ref 78.0–100.0)
Platelets: 219 10*3/uL (ref 150–400)
RBC: 4.48 MIL/uL (ref 3.87–5.11)
RDW: 13.3 % (ref 11.5–15.5)
WBC: 6.6 10*3/uL (ref 4.0–10.5)

## 2014-10-22 NOTE — Patient Instructions (Signed)
DASH Eating Plan °DASH stands for "Dietary Approaches to Stop Hypertension." The DASH eating plan is a healthy eating plan that has been shown to reduce high blood pressure (hypertension). Additional health benefits may include reducing the risk of type 2 diabetes mellitus, heart disease, and stroke. The DASH eating plan may also help with weight loss. °WHAT DO I NEED TO KNOW ABOUT THE DASH EATING PLAN? °For the DASH eating plan, you will follow these general guidelines: °· Choose foods with a percent daily value for sodium of less than 5% (as listed on the food label). °· Use salt-free seasonings or herbs instead of table salt or sea salt. °· Check with your health care provider or pharmacist before using salt substitutes. °· Eat lower-sodium products, often labeled as "lower sodium" or "no salt added." °· Eat fresh foods. °· Eat more vegetables, fruits, and low-fat dairy products. °· Choose whole grains. Look for the word "whole" as the first word in the ingredient list. °· Choose fish and skinless chicken or turkey more often than red meat. Limit fish, poultry, and meat to 6 oz (170 g) each day. °· Limit sweets, desserts, sugars, and sugary drinks. °· Choose heart-healthy fats. °· Limit cheese to 1 oz (28 g) per day. °· Eat more home-cooked food and less restaurant, buffet, and fast food. °· Limit fried foods. °· Cook foods using methods other than frying. °· Limit canned vegetables. If you do use them, rinse them well to decrease the sodium. °· When eating at a restaurant, ask that your food be prepared with less salt, or no salt if possible. °WHAT FOODS CAN I EAT? °Seek help from a dietitian for individual calorie needs. °Grains °Whole grain or whole wheat bread. Brown rice. Whole grain or whole wheat pasta. Quinoa, bulgur, and whole grain cereals. Low-sodium cereals. Corn or whole wheat flour tortillas. Whole grain cornbread. Whole grain crackers. Low-sodium crackers. °Vegetables °Fresh or frozen vegetables  (raw, steamed, roasted, or grilled). Low-sodium or reduced-sodium tomato and vegetable juices. Low-sodium or reduced-sodium tomato sauce and paste. Low-sodium or reduced-sodium canned vegetables.  °Fruits °All fresh, canned (in natural juice), or frozen fruits. °Meat and Other Protein Products °Ground beef (85% or leaner), grass-fed beef, or beef trimmed of fat. Skinless chicken or turkey. Ground chicken or turkey. Pork trimmed of fat. All fish and seafood. Eggs. Dried beans, peas, or lentils. Unsalted nuts and seeds. Unsalted canned beans. °Dairy °Low-fat dairy products, such as skim or 1% milk, 2% or reduced-fat cheeses, low-fat ricotta or cottage cheese, or plain low-fat yogurt. Low-sodium or reduced-sodium cheeses. °Fats and Oils °Tub margarines without trans fats. Light or reduced-fat mayonnaise and salad dressings (reduced sodium). Avocado. Safflower, olive, or canola oils. Natural peanut or almond butter. °Other °Unsalted popcorn and pretzels. °The items listed above may not be a complete list of recommended foods or beverages. Contact your dietitian for more options. °WHAT FOODS ARE NOT RECOMMENDED? °Grains °White bread. White pasta. White rice. Refined cornbread. Bagels and croissants. Crackers that contain trans fat. °Vegetables °Creamed or fried vegetables. Vegetables in a cheese sauce. Regular canned vegetables. Regular canned tomato sauce and paste. Regular tomato and vegetable juices. °Fruits °Dried fruits. Canned fruit in light or heavy syrup. Fruit juice. °Meat and Other Protein Products °Fatty cuts of meat. Ribs, chicken wings, bacon, sausage, bologna, salami, chitterlings, fatback, hot dogs, bratwurst, and packaged luncheon meats. Salted nuts and seeds. Canned beans with salt. °Dairy °Whole or 2% milk, cream, half-and-half, and cream cheese. Whole-fat or sweetened yogurt. Full-fat   cheeses or blue cheese. Nondairy creamers and whipped toppings. Processed cheese, cheese spreads, or cheese  curds. °Condiments °Onion and garlic salt, seasoned salt, table salt, and sea salt. Canned and packaged gravies. Worcestershire sauce. Tartar sauce. Barbecue sauce. Teriyaki sauce. Soy sauce, including reduced sodium. Steak sauce. Fish sauce. Oyster sauce. Cocktail sauce. Horseradish. Ketchup and mustard. Meat flavorings and tenderizers. Bouillon cubes. Hot sauce. Tabasco sauce. Marinades. Taco seasonings. Relishes. °Fats and Oils °Butter, stick margarine, lard, shortening, ghee, and bacon fat. Coconut, palm kernel, or palm oils. Regular salad dressings. °Other °Pickles and olives. Salted popcorn and pretzels. °The items listed above may not be a complete list of foods and beverages to avoid. Contact your dietitian for more information. °WHERE CAN I FIND MORE INFORMATION? °National Heart, [employer], [employer] Institute: www.nhlbi.nih.gov/health/health-topics/topics/dash/ °Document Released: 12/03/2011 Document Revised: 04/30/2014 Document Reviewed: 10/18/2013 °ExitCare® Patient Information ©2015 [employer]. This information is not intended to replace advice given to you by your health care provider. Make sure you discuss any questions you have with your health care provider. ° °

## 2014-10-22 NOTE — Telephone Encounter (Signed)
  She forgot to tell you she needs refill on Bp meds      [facility] [address]

## 2014-10-22 NOTE — Progress Notes (Signed)
   Subjective:   [name] is a 63 y.o. female presenting on 10/22/2014 with FOLLOW UP ON BP  Doing well on medication, taking Norvasc daily.  Not checking BPs at home. Sometimes gets dizzy after eating, 2-3 times per week ,but no SOB, no chest pain, no palpitations.  Gets edema mildly in legs after being on feet all day.  She notes a growth on her tongue, maybe from acidic foods.   Has been eating some mustard for leg cramps.  Has been using smokeless tobacco/dipping since age 89yo.  Eats low sodium diet, pinto beans regularly.  Drinks a lot of water, but also drinks [employer].  No other aggravating or relieving factors.  No other complaint.  Review of Systems ROS as in subjective    Objective: BP 132/80  Pulse 78  Temp(Src) 98.1 F (36.7 C) (Oral)  Resp 16  Wt 174 lb (78.926 kg)  BP Readings from Last 3 Encounters:  10/22/14 132/80  07/10/14 130/80  07/11/13 132/80    Gen: wd, wn, nad Right forearm dorsal mid shaft with yellow/purple healing ecchymosis 4cm diameter, small 5mm diameter knot mobile under the ecchymosis suggestive of hematoma Oral cavity: MMM, tongue with central flat area of pink tissue without normal surface, likely from recent acidic food/geographic tongue, no lesions  Neck: supple, no lymphadenopathy, no thyromegaly, no masses, no JVD  Heart: brief 2/6 faint systolic murmur heard best in left upper sternal border, otherwise RRR, normal S1, S2  Lungs: clear, no wheezes, rhonchi, or rales  Pulses: 2+ symmetric, upper and lower extremities, normal cap refill  Ext: no edema    Adult ECG Report  Indication: hypertension  Rate: 61 bpm  Rhythm: normal sinus rhythm and sinus arrhythmia  QRS Axis: 37 degrees  PR Interval: 152ms  QRS Duration: 82ms  QTc: 434ms  Conduction Disturbances: none  Other Abnormalities: none  Patient's cardiac risk factors are: dyslipidemia, hypertension and smoking/ tobacco exposure.  EKG comparison: none  Narrative  Interpretation: normal EKG     Assessment: Encounter Diagnoses  Name Primary?  . Essential hypertension Yes  . Smokeless tobacco use   . Second hand smoke exposure   . Geographic tongue   . Hematoma   . Heart murmur    Plan: HTN - EKG done and labs today.   F/u pending labs.    Smokeless tobacco, second hand smoke - not interested in quitting dipping despite the risks  Geographic tongue - reassures, should gradually improve  hematoma right forearm - reassured, will resolve with time  Murmur - consider echo.  Will check paper chart for any prior echo.

## 2014-10-23 ENCOUNTER — Other Ambulatory Visit: Payer: Self-pay | Admitting: Medical

## 2014-10-23 DIAGNOSIS — I10 Essential (primary) hypertension: Secondary | ICD-10-CM

## 2014-10-23 DIAGNOSIS — R011 Cardiac murmur, unspecified: Secondary | ICD-10-CM

## 2014-10-23 LAB — COMPREHENSIVE METABOLIC PANEL
ALT: 14 U/L (ref 0–35)
AST: 17 U/L (ref 0–37)
Albumin: 4.2 g/dL (ref 3.5–5.2)
Alkaline Phosphatase: 75 U/L (ref 39–117)
BUN: 11 mg/dL (ref 6–23)
CO2: 25 mEq/L (ref 19–32)
Calcium: 9 mg/dL (ref 8.4–10.5)
Chloride: 104 mEq/L (ref 96–112)
Creat: 0.52 mg/dL (ref 0.50–1.10)
Glucose, Bld: 90 mg/dL (ref 70–99)
Potassium: 4 mEq/L (ref 3.5–5.3)
Sodium: 141 mEq/L (ref 135–145)
Total Bilirubin: 0.9 mg/dL (ref 0.2–1.2)
Total Protein: 7.3 g/dL (ref 6.0–8.3)

## 2014-10-23 LAB — LIPID PANEL
Cholesterol: 193 mg/dL (ref 0–200)
HDL: 45 mg/dL (ref >39)
LDL Cholesterol: 130 mg/dL — ABNORMAL HIGH (ref 0–99)
Total CHOL/HDL Ratio: 4.3 Ratio
Triglycerides: 91 mg/dL (ref <150)
VLDL: 18 mg/dL (ref 0–40)

## 2014-10-23 LAB — TSH: TSH: 0.431 u[IU]/mL (ref 0.350–4.500)

## 2014-10-23 MED ORDER — ATORVASTATIN CALCIUM 20 MG PO TABS: 20.0000 mg | ORAL_TABLET | Freq: Every day | ORAL | Status: DC

## 2014-10-23 MED ORDER — AMLODIPINE BESYLATE 10 MG PO TABS: 10.0000 mg | ORAL_TABLET | Freq: Every day | ORAL | Status: DC

## 2014-10-24 ENCOUNTER — Other Ambulatory Visit: Payer: Self-pay | Admitting: Family Medicine

## 2014-10-24 DIAGNOSIS — R011 Cardiac murmur, unspecified: Secondary | ICD-10-CM

## 2014-12-28 DIAGNOSIS — Z9289 Personal history of other medical treatment: Secondary | ICD-10-CM

## 2014-12-28 DIAGNOSIS — R011 Cardiac murmur, unspecified: Secondary | ICD-10-CM

## 2014-12-28 HISTORY — DX: Cardiac murmur, unspecified: R01.1

## 2014-12-28 HISTORY — DX: Personal history of other medical treatment: Z92.89

## 2014-12-28 HISTORY — PX: TRANSTHORACIC ECHOCARDIOGRAM: SHX275

## 2014-12-28 HISTORY — PX: NM MYOVIEW LTD: HXRAD82

## 2015-01-10 ENCOUNTER — Ambulatory Visit (INDEPENDENT_AMBULATORY_CARE_PROVIDER_SITE_OTHER): Payer: BC Managed Care – PPO | Admitting: Cardiology

## 2015-01-10 ENCOUNTER — Encounter: Payer: Self-pay | Admitting: Cardiology

## 2015-01-10 VITALS — BP 136/70 | HR 78 | Ht 62.0 in | Wt 171.9 lb

## 2015-01-10 DIAGNOSIS — I10 Essential (primary) hypertension: Secondary | ICD-10-CM

## 2015-01-10 DIAGNOSIS — Z8249 Family history of ischemic heart disease and other diseases of the circulatory system: Secondary | ICD-10-CM

## 2015-01-10 DIAGNOSIS — R0600 Dyspnea, unspecified: Secondary | ICD-10-CM | POA: Insufficient documentation

## 2015-01-10 DIAGNOSIS — R0609 Other forms of dyspnea: Secondary | ICD-10-CM

## 2015-01-10 DIAGNOSIS — I208 Other forms of angina pectoris: Secondary | ICD-10-CM

## 2015-01-10 DIAGNOSIS — I2089 Other forms of angina pectoris: Secondary | ICD-10-CM

## 2015-01-10 DIAGNOSIS — R938 Abnormal findings on diagnostic imaging of other specified body structures: Secondary | ICD-10-CM

## 2015-01-10 DIAGNOSIS — R9389 Abnormal findings on diagnostic imaging of other specified body structures: Secondary | ICD-10-CM

## 2015-01-10 DIAGNOSIS — E782 Mixed hyperlipidemia: Secondary | ICD-10-CM

## 2015-01-10 DIAGNOSIS — I517 Cardiomegaly: Secondary | ICD-10-CM

## 2015-01-10 NOTE — Assessment & Plan Note (Addendum)
Lab Results  Component Value Date   CHOL 193 10/22/2014   HDL 45 10/22/2014   LDLCALC 130* 10/22/2014   TRIG 91 10/22/2014   CHOLHDL 4.3 10/22/2014    Not on statin. If there is abnormal findings seen on our evaluations I would recommend that she probably would need medical therapy. For now continue dietary modification and exercise recommendations pending results of her evaluations.

## 2015-01-10 NOTE — Progress Notes (Signed)
PATIENT: [name] MRN: [id number] DOB: 02/26/1951 PCP: Ernst Breach, PA-C  Clinic Note: Chief Complaint  Patient presents with  . Shortness of Breath    had CXR which showed enlarged heart. Strong family cardiac history    HPI: [name] is a 64 y.o. female with a PMH below who presents today for 5 year cardiology follow-up after chest x-ray showed enlarged heart. Chest x-ray was performed to evaluate cough and exertional dyspnea. She was a former patient here of Dr. Caprice Kluver, Who initially was seen in December 2006 for exertional chest pain. She underwent cardiac catheterization that did not show significant CAD. This was in the setting of very difficult to control hypertension. He also performed renal artery angiography which did not show renal artery stenosis. She was last seen in July 2011. At that time her blood pressure was relatively well controlled. Felt that she could potentially just follow up with her primary physician and return here on an as-needed basis.  Interval History:  The listed reason for her referral was "enlarged heart on chest x-ray".  1 I started talking to her she has started noticing exertional dyspnea that occurs she walks up a flight of steps or walks more than around the block. She has been achieving this to deconditioning and lack of exercise. She is also noted that she has been sleeping on 3 pillows for about a year now for short of breath lying down suggest orthopnea. She has mild occasional times when she'll wake up short of breath if she is rolled off the pillows suggestive of PND. She used to have swelling, but that is improved. Also her blood pressures gotten much better.  In addition to having what sounds like 3 pillow orthopnea, she also notes if she were to lie down flat that she'll occasionally have symptoms of tightness in her chest the second pressure sensation that is relieved with sitting up. This is somewhat suggestive of possible  angina decubitus. She has not noted any of this chest tightness with exertion. She has intermittent palpitations but nothing significant. Occasional positional dizziness that resolves after a few seconds.  Cardiovascular ROS: positive for - dyspnea on exertion, edema, orthopnea, palpitations, paroxysmal nocturnal dyspnea, shortness of breath and chest pain with lying down negative for - edema, loss of consciousness, murmur, rapid heart rate or syncope/near syncope, TIA/amaurosis fugax, claudication :  Past Medical History  Diagnosis Date  . Allergic rhinitis, cause unspecified   . Essential hypertension     previously uncontrolled, now well-controlled; no evidence of renal artery stenosis  . Obesity   . Dyslipidemia   . GERD (gastroesophageal reflux disease)   . History of migraine headaches   . Arthritis   . History of cardiac catheterization December 2006    for exertional chest pain: Nonobstructive CAD  . Coronary artery disease, non-occlusive December 2006    Prior Cardiac Evaluation and Past Surgical History: Past Surgical History  Procedure Laterality Date  . Abdominal hysterectomy      PARTIAL (benign, for bleeding)  . Laparoscopy  12/02    ovarian cyst  . Cardiac catheterization  12/06    Dr. Clarene Duke; no significant CAD. Normal LV function with EF of 60-65%. Also noted no RAS.  Marland Kitchen Esophagogastroduodenoscopy  2009    Dr. Loreta Ave  . Colonoscopy  2009    Dr. Loreta Ave    Allergies  Allergen Reactions  . Morphine And Related Other (See Comments)    Palpitations and shaking.  Current Outpatient Prescriptions  Medication Sig Dispense Refill  . amLODipine (NORVASC) 10 MG tablet Take 1 tablet (10 mg total) by mouth daily. 30 tablet 5  . cetirizine (ZYRTEC) 10 MG tablet Take 10 mg by mouth as needed.      No current facility-administered medications for this visit.    History   Social History Narrative   Lives with husband and grandson (20 yo--lives with them since born,  raised by her).   She has been married 43 years. They have 3 children, 10 grandchildren and one great-grandchild. Her husband Dorene Sorrow is also a patient at this clinic.   Has been dipping snuff since she was a teenager.   She drinks 2-3 Sherilyn Dacosta Beers most days out of the week   She "does not do as much exercise as she should, walking maybe 1 day week.   family history includes Cancer in her maternal aunt; Diabetes in her maternal aunt, maternal grandmother, and maternal uncle; Heart disease in her brother, brother, brother, maternal aunt, maternal grandmother, maternal uncle, mother, and paternal uncle; Heart disease (age of onset: 55) in her father; Hypertension in her mother.  ROS: A comprehensive Review of Systems - was performed Review of Systems  Constitutional: Negative for fever, chills and malaise/fatigue.  HENT: Negative for nosebleeds.   Respiratory: Positive for cough (chronic for about a year now.) and shortness of breath (with going up stairs  or walking more than 1 block.).   Cardiovascular: Positive for chest pain (when lying down flat), palpitations (rare palpitations. Nothing rapid), orthopnea (3 pillow) and leg swelling (intermittently, but goes down at night). Negative for PND.  Gastrointestinal: Positive for heartburn and constipation. Negative for blood in stool and melena.  Genitourinary: Negative for hematuria.  Musculoskeletal: Positive for back pain, joint pain (kknees) and neck pain.  Neurological: Positive for dizziness (occasional positional dizziness). Negative for headaches.  Endo/Heme/Allergies: Does not bruise/bleed easily.  Psychiatric/Behavioral: Negative for depression and memory loss. The patient is not nervous/anxious and does not have insomnia.   All other systems reviewed and are negative.   PHYSICAL EXAM BP 136/70 mmHg  Pulse 78  Ht  (1.575 m)  Wt 171 lb 14.4 oz (77.973 kg)  BMI 31.43 kg/m2 General appearance: alert, cooperative, appears  older than stated age, no distress, mildly obese and well-groomed. Answers questions appropriately. Neck: no adenopathy, no carotid bruit and no JVD Lungs: clear to auscultation bilaterally, normal percussion bilaterally and nonlabored, good air movement Heart: regular rate and rhythm, S1, S2 normal, no murmur, click, rub or gallop and normal apical impulse Abdomen: soft, non-tender; bowel sounds normal; no masses,  no organomegaly and mild truncal obesity Extremities: extremities normal, atraumatic, no cyanosis or edema and no edema, redness or tenderness in the calves or thighs Pulses: 2+ and symmetric Skin: Skin color, texture, turgor normal. No rashes or lesions Neurologic: Alert and oriented X 3, normal strength and tone. Normal symmetric reflexes. Normal coordination and gait Cranial nerves: normal   Adult ECG Report -- not performed  EKG from PCP in October -showed sinus rhythm with sinus arrhythmia but otherwise normal.  Recent Labs: Reviewed in Epic  ASSESSMENT / PLAN: Pleasant 64 year old woman with significant family history of CAD who has hypertension and reported hyperlipidemia referred for evaluation of enlarged heart on chest x-ray in the setting of exertional dyspnea and orthopnea. She also has chest tightness or pressure with lying down it is suggestive of angina decubitus.  Dyspnea on exertion Her exertional dyspnea  may indeed be related to deconditioning, however with her family history and dyslipidemia plus hypertension I concerned that the combination of exertional dyspnea plus angina decubitus could potentially be ischemic in nature. She could also have previously undiagnosed heart failure with cardiomegaly on chest x-ray.  Plan:2-D echocardiogram and Exercise Myoview that can be [facility] to [facility] as she is not sure if she can potentially walk on the treadmill due to knee discomfort.  Follow-up after results are complete.   Angina decubitus Chest discomfort with  lying down associated with 3 pillow orthopnea. This is concerning in the setting of cardiomegaly on chest x-ray that she could be having some symptoms of heart failure at least diastolic if not systolic. With history of difficult to control hypertension in the past she very well could have hypertensive cardiomyopathy with either thickened ventricular walls versus more burned-out cardiomyopathy with dilation.  Plan: Check 2-D echocardiogram. But with exertional dyspnea also, will add Myoview stress test to exclude ischemic etiology for chest pain.  Start daily baby aspirin   Cardiomegaly Checking 2-D echo   Abnormal CXR (chest x-ray) - cardiomegaly See above   Essential hypertension - previously difficult to control Relatively stable on amlodipine. In the past she was also on ACE inhibitor. Depending on what the echocardiogram shows may need to consider possibly using ACE inhibitor/ARB versus beta blocker.   Mixed hyperlipidemia Lab Results  Component Value Date   CHOL 193 10/22/2014   HDL 45 10/22/2014   LDLCALC 130* 10/22/2014   TRIG 91 10/22/2014   CHOLHDL 4.3 10/22/2014    Not on statin. If there is abnormal findings seen on our evaluations I would recommend that she probably would need medical therapy. For now continue dietary modification and exercise recommendations pending results of her evaluations.   Family history of premature CAD See above    Orders Placed This Encounter  Procedures  . Myocardial Perfusion Imaging    Standing Status: Future     Number of Occurrences:      Standing Expiration Date: 01/11/2016    Scheduling Instructions:     Okay to use Lexiscan if patient cannot walk    Order Specific Question:  Where should this test be performed    Answer:  [facility]    Order Specific Question:  Type of stress    Answer:  Exercise    Order Specific Question:  Patient weight in lbs    Answer:  171  . 2D Echocardiogram without contrast    Standing  Status: Future     Number of Occurrences:      Standing Expiration Date: 01/11/2016    Order Specific Question:  Type of Echo    Answer:  Complete    Order Specific Question:  Where should this test be performed    Answer:  [facility]    Order Specific Question:  Reason for exam-Echo    Answer:  Dyspnea  786.09 / R06.00    Order Specific Question:  Reason for exam-Echo    Answer:  Cardiomegaly  429.3 / I51.7   No orders of the defined types were placed in this encounter.    Followup: 1-2 months  Herbie Lehrmann W. Herbie BaltimoreHARDING, M.D., M.S. Interventional Cardiolgy [facility]

## 2015-01-10 NOTE — Assessment & Plan Note (Addendum)
Chest discomfort with lying down associated with 3 pillow orthopnea. This is concerning in the setting of cardiomegaly on chest x-ray that she could be having some symptoms of heart failure at least diastolic if not systolic. With history of difficult to control hypertension in the past she very well could have hypertensive cardiomyopathy with either thickened ventricular walls versus more burned-out cardiomyopathy with dilation.  Plan: Check 2-D echocardiogram. But with exertional dyspnea also, will add Myoview stress test to exclude ischemic etiology for chest pain.  Start daily baby aspirin

## 2015-01-10 NOTE — Patient Instructions (Signed)
Your physician has requested that you have an echocardiogram. Echocardiography is a painless test that uses sound waves to create images of your heart. It provides your doctor with information about the size and shape of your heart and how well your heart's chambers and valves are working. This procedure takes approximately one hour. There are no restrictions for this procedure.  Your physician has requested that you have en exercise stress myoview. For further information please visit [email or web address]. Please follow instruction sheet, as given.

## 2015-01-10 NOTE — Assessment & Plan Note (Signed)
Her exertional dyspnea may indeed be related to deconditioning, however with her family history and dyslipidemia plus hypertension I concerned that the combination of exertional dyspnea plus angina decubitus could potentially be ischemic in nature. She could also have previously undiagnosed heart failure with cardiomegaly on chest x-ray.  Plan:2-D echocardiogram and Exercise Myoview that can be converted to [facility] as she is not sure if she can potentially walk on the treadmill due to knee discomfort.  Follow-up after results are complete.

## 2015-01-10 NOTE — Assessment & Plan Note (Signed)
See above

## 2015-01-10 NOTE — Assessment & Plan Note (Signed)
Relatively stable on amlodipine. In the past she was also on ACE inhibitor. Depending on what the echocardiogram shows may need to consider possibly using ACE inhibitor/ARB versus beta blocker.

## 2015-01-10 NOTE — Assessment & Plan Note (Signed)
Checking 2-D echo

## 2015-01-17 ENCOUNTER — Telehealth (HOSPITAL_COMMUNITY): Payer: Self-pay

## 2015-01-17 NOTE — Telephone Encounter (Signed)
Encounter complete. 

## 2015-01-22 ENCOUNTER — Ambulatory Visit (HOSPITAL_COMMUNITY)
Admission: RE | Admit: 2015-01-22 | Discharge: 2015-01-22 | Disposition: A | Discharge status: Home / Self Care | Payer: BC Managed Care – PPO | Source: Ambulatory Visit | Attending: Cardiology | Admitting: Cardiology

## 2015-01-22 ENCOUNTER — Telehealth: Payer: Self-pay | Admitting: *Deleted

## 2015-01-22 DIAGNOSIS — I517 Cardiomegaly: Secondary | ICD-10-CM | POA: Insufficient documentation

## 2015-01-22 DIAGNOSIS — Z8249 Family history of ischemic heart disease and other diseases of the circulatory system: Secondary | ICD-10-CM | POA: Insufficient documentation

## 2015-01-22 DIAGNOSIS — R0600 Dyspnea, unspecified: Secondary | ICD-10-CM

## 2015-01-22 DIAGNOSIS — I10 Essential (primary) hypertension: Secondary | ICD-10-CM | POA: Diagnosis not present

## 2015-01-22 DIAGNOSIS — Z6831 Body mass index (BMI) 31.0-31.9, adult: Secondary | ICD-10-CM | POA: Diagnosis not present

## 2015-01-22 DIAGNOSIS — E669 Obesity, unspecified: Secondary | ICD-10-CM | POA: Diagnosis not present

## 2015-01-22 DIAGNOSIS — F17220 Nicotine dependence, chewing tobacco, uncomplicated: Secondary | ICD-10-CM | POA: Insufficient documentation

## 2015-01-22 DIAGNOSIS — R0609 Other forms of dyspnea: Secondary | ICD-10-CM

## 2015-01-22 DIAGNOSIS — I2510 Atherosclerotic heart disease of native coronary artery without angina pectoris: Secondary | ICD-10-CM | POA: Insufficient documentation

## 2015-01-22 MED ORDER — AMINOPHYLLINE 25 MG/ML IV SOLN
75.0000 mg | Freq: Once | INTRAVENOUS | Status: AC
  Administered 2015-01-22: 75 mg via INTRAVENOUS

## 2015-01-22 MED ORDER — TECHNETIUM TC 99M SESTAMIBI GENERIC - CARDIOLITE
10.9000 | Freq: Once | INTRAVENOUS | Status: AC | PRN
  Administered 2015-01-22: 10.9 via INTRAVENOUS

## 2015-01-22 MED ORDER — TECHNETIUM TC 99M SESTAMIBI GENERIC - CARDIOLITE
32.7000 | Freq: Once | INTRAVENOUS | Status: AC | PRN
  Administered 2015-01-22: 32.7 via INTRAVENOUS

## 2015-01-22 MED ORDER — REGADENOSON 0.4 MG/5ML IV SOLN
0.4000 mg | Freq: Once | INTRAVENOUS | Status: AC
  Administered 2015-01-22: 0.4 mg via INTRAVENOUS

## 2015-01-22 NOTE — Progress Notes (Signed)
2D Echo Performed 01/22/2015    Mikaella Escalona, RCS  

## 2015-01-22 NOTE — Progress Notes (Signed)
Quick Note:  Echo results: Good news: Essentially normal echocardiogram and normal pump function and normal valve function. No signs to suggest heart attack.. EF: 60-65%. No regional wall motion abnormalities  HARDING,DAVID W, MD  ______ 

## 2015-01-22 NOTE — Telephone Encounter (Signed)
OPEN ERROR

## 2015-01-22 NOTE — Procedures (Addendum)
[facility] [address] [place] [address] [phone]  Cardiology Nuclear Med Study  Barbara LimeJanet S Garrison is a 64 y.o. female     MRN : [id number]     DOB: [birth year]  Procedure Date: 01/22/2015  Nuclear Med Background Indication for Stress Test:  Follow up CAD; History:  CAD-non-obstructive; Cardiac Risk Factors: Family History - CAD, Hypertension, Lipids, Obesity and Pt states she dips snuff.  Symptoms:  Chest Pain, Dizziness, DOE and SOB   Nuclear Pre-Procedure Caffeine/Decaff Intake:  7:00pm NPO After: 5:00am   IV Site: R Forearm  IV 0.9% NS with Angio Cath:  22g  Chest Size (in):  n/a IV Started by: Barbara OgrenAmanda Wease, RN  Height: 5\' 2"  (1.575 m)  Cup Size: C  BMI:  Body mass index is 31.27 kg/(m^2). Weight:  171 lb (77.565 kg)   Tech Comments:  n/a    Nuclear Med Study 1 or 2 day study: 1 day  Stress Test Type:  Stress  Order Authorizing Provider:  Dr. Herbie BaltimoreHarding   Resting Radionuclide: Technetium 2266m Sestamibi  Resting Radionuclide Dose: 10.9 mCi   Stress Radionuclide:  Technetium 8066m Sestamibi  Stress Radionuclide Dose: 32.7 mCi           Stress Protocol Rest HR: 53 Stress HR: 92  Rest BP: 155/83 Stress BP: 194/90  Exercise Time (min): n/a METS: n/a   Predicted Max HR: 156 bpm % Max HR: 58.97 bpm Rate Pressure Product: [id number]  Dose of Adenosine (mg):  n/a Dose of Lexiscan: 0.4 mg  Dose of Atropine (mg): n/a Dose of Dobutamine: n/a mcg/kg/min (at max HR)  Stress Test Technologist: [name], CCT Nuclear Technologist: [name], CNMT   Rest Procedure:  Myocardial perfusion imaging was performed at rest 45 minutes following the intravenous administration of Technetium 3366m Sestamibi. Stress Procedure:  The patient received IV Lexiscan 0.4 mg over 15-seconds.  Technetium 5266m Sestamibi injected  IV at 30-seconds.  Patient experienced SOB, Stomach Pain and Headache and 75 mg Aminophylline IV was  administered.  There were no significant changes with Lexiscan.  Quantitative spect images were obtained after a 45 minute delay.  Transient Ischemic Dilatation (Normal <1.22):  1.14  QGS EDV:  78 ml QGS ESV:  28 ml LV Ejection Fraction: 63%  Rest ECG: NSR - Normal EKG  Stress ECG: No significant change from baseline ECG  QPS Raw Data Images:  There is a breast shadow that accounts for the anterior attenuation. Stress Images:  mild anteroapical defect Rest Images:  mild anteroapical defect Subtraction (SDS):  There is a fixed anteriour defect that is most consistent with breast attenuation.  Impression Exercise Capacity:  Lexiscan with no exercise. BP Response:  Hypertensive blood pressure response. Clinical Symptoms:  No significant symptoms noted. ECG Impression:  No significant ECG changes with Lexiscan. Comparison with Prior Nuclear Study: No previous nuclear study performed  Overall Impression:  Low risk stress nuclear study with a small, mild, mostly fixed (SDS 1) distal anteroapical breast attenuation artifact. No significant reversible ischemia.  LV Wall Motion:  NL LV Function; NL Wall Motion; LVEF 63%  Barbara NoseKenneth C. Hilty, MD, Baptist Health Rehabilitation InstituteFACC Board Certified in Nuclear Cardiology Attending Cardiologist [name]  Barbara NoseHILTY,Barbara C, MD  01/22/2015 12:37 PM

## 2015-02-01 ENCOUNTER — Encounter: Payer: Self-pay | Admitting: Cardiology

## 2015-02-01 ENCOUNTER — Ambulatory Visit (INDEPENDENT_AMBULATORY_CARE_PROVIDER_SITE_OTHER): Payer: BC Managed Care – PPO | Admitting: Cardiology

## 2015-02-01 VITALS — BP 142/76 | HR 64 | Ht 62.0 in | Wt 172.6 lb

## 2015-02-01 DIAGNOSIS — R0789 Other chest pain: Secondary | ICD-10-CM

## 2015-02-01 DIAGNOSIS — R079 Chest pain, unspecified: Secondary | ICD-10-CM | POA: Insufficient documentation

## 2015-02-01 NOTE — Progress Notes (Signed)
Cardiology Office Note   Date:  02/01/2015   ID:  [name], DOB 1951-01-29, MRN [id number]  PCP:  Ernst Breach, PA-C  Cardiologist:  Dr. [name]     Chief Complaint  Patient presents with  . Shortness of Breath    results of stress test and echo. patient reports shortness of breath.      History of Present Illness: [name] is a 64 y.o. female who presents for follow up of tests and SOB.  64 y.o. female with a PMH below who saw Dr. Ranae Palms for an XRAY showed enlarged heart. Chest x-ray was performed to evaluate cough and exertional dyspnea.  She was seen and evaluated by Dr. [name].  Hx of cardiac catheterization that did not show significant CAD. This was in the setting of very difficult to control hypertension. He also performed renal artery angiography which did not show renal artery stenosis. She had an echo cardiogram as well as a Myoview completed. These were normal studies.  She is doing well, occ chest heaviness that may be BP related, does not feel like indigestion. Some DOE, felt to be deconditioning.  She is loosing weight down 5 pounds.  She was reassured on her heart and will follow up on PRN Basis.  ECHO:  - Left ventricle: The cavity size was normal. There was mild focal basal hypertrophy of the septum. Systolic function was normal. The estimated ejection fraction was in the range of 60% to 65%. Wall motion was normal; there were no regional wall motion abnormalities. There was no evidence of elevated ventricular filling pressure by Doppler parameters. - Aortic valve: There was trivial regurgitation.  Nuc study: Overall Impression: Low risk stress nuclear study with a small,  mild, mostly fixed (SDS 1) distal anteroapical breast attenuation artifact. No significant reversible ischemia.  LV Wall Motion: NL LV Function; NL Wall Motion; LVEF    Past Medical History  Diagnosis Date  . Allergic rhinitis, cause unspecified     . Essential hypertension     previously uncontrolled, now well-controlled; no evidence of renal artery stenosis  . Obesity   . Dyslipidemia   . GERD (gastroesophageal reflux disease)   . History of migraine headaches   . Arthritis   . History of cardiac catheterization December 2006    for exertional chest pain: Nonobstructive CAD  . Coronary artery disease, non-occlusive December 2006    Past Surgical History  Procedure Laterality Date  . Abdominal hysterectomy      PARTIAL (benign, for bleeding)  . Laparoscopy  12/02    ovarian cyst  . Cardiac catheterization  12/06    Dr. Clarene Duke; no significant CAD. Normal LV function with EF of 60-65%. Also noted no RAS.  Marland Kitchen Esophagogastroduodenoscopy  2009    Dr. Loreta Ave  . Colonoscopy  2009    Dr. Loreta Ave     Current Outpatient Prescriptions  Medication Sig Dispense Refill  . amLODipine (NORVASC) 10 MG tablet Take 1 tablet (10 mg total) by mouth daily. 30 tablet 5  . cetirizine (ZYRTEC) 10 MG tablet Take 10 mg by mouth as needed.      No current facility-administered medications for this visit.    Allergies:   Morphine and related    Social History:  The patient  reports that she has never smoked. Her smokeless tobacco use includes Snuff. She reports that she drinks about 1.8 oz of alcohol per week. She reports that she does not use illicit drugs.  Family History:  The patient's family history includes Cancer in her maternal aunt; Diabetes in her maternal aunt, maternal grandmother, and maternal uncle; Heart disease in her brother, brother, brother, maternal aunt, maternal grandmother, maternal uncle, mother, and paternal uncle; Heart disease (age of onset: 6439) in her father; Hypertension in her mother.    ROS:  General:no colds or fevers,  CV:see HPI PUL:see HPI GI:no diarrhea constipation or melena, no indigestion GU:no hematuria, no dysuria Neuro:no syncope, no lightheadedness Endo:no diabetes, no thyroid disease  Wt Readings  from Last 3 Encounters:  02/01/15 172 lb 9.6 oz (78.291 kg)  01/10/15 171 lb 14.4 oz (77.973 kg)  10/22/14 174 lb (78.926 kg)     PHYSICAL EXAM: VS:  BP 142/76 mmHg  Pulse 64  Ht 5\' 2"  (1.575 m)  Wt 172 lb 9.6 oz (78.291 kg)  BMI 31.56 kg/m2 , BMI Body mass index is 31.56 kg/(m^2). General:Pleasant affect, NAD Skin:Warm and dry, brisk capillary refill HEENT:normocephalic, sclera clear, mucus membranes moist Neck:supple, no JVD, no bruits  Heart:S1S2 RRR without murmur, gallup, rub or click Lungs:clear without rales, rhonchi, or wheezes Neuro:alert and oriented, MAE, follows commands, + facial symmetry  EKG:  EKG is not ordered today.    Recent Labs: 10/22/2014: ALT 14; BUN 11; Creatinine 0.52; Hemoglobin 13.8; Platelets 219; Potassium 4.0; Sodium 141; TSH 0.431    Lipid Panel    Component Value Date/Time   CHOL 193 10/22/2014 0924   TRIG 91 10/22/2014 0924   HDL 45 10/22/2014 0924   CHOLHDL 4.3 10/22/2014 0924   VLDL 18 10/22/2014 0924   LDLCALC 130* 10/22/2014 0924       Other studies Reviewed: Additional studies/ records that were reviewed today include: . Nuc study: Low risk stress nuclear study with a small,  mild, mostly fixed (SDS 1) distal anteroapical breast attenuation artifact. No significant reversible ischemia.  LV Wall Motion: NL LV Function; NL Wall Motion; LVEF 63%  ECHO:  ASSESSMENT AND PLAN:  1.  Dyspnea on exertion Her exertional dyspnea may indeed be related to deconditioning, however with her family history and dyslipidemia plus hypertension there was concern that the combination of exertional dyspnea plus angina decubitus could potentially be ischemic in nature. She could also have previously undiagnosed heart failure with cardiomegaly on chest x-ray.    Echo and nuclear stress test were negative for ischemia and normal LV function was present. Patient was reassured   Chest Pain with lying down associated with 3 pillow orthopnea. Normal  echo and normal stress test. She was reassured. Other noncardiac reasons for chest pain should be investigated by PCP  Cardiomegaly normal echo EF 60-65%  Essential hypertension - previously difficult to control Relatively stable on amlodipine.    Current medicines are reviewed with the patient today.  The patient understands her medications and has no further questions. The following changes have been made:  See above Labs/ tests ordered today include:see above  Disposition:   FU:  see above  Signed, Leone BrandINGOLD,Khaleesi Gruel R, NP  02/01/2015 2:43 PM    [facility] [address], [place], [place]  [address]/ [address] [place], [place] Phone: [phone]; Fax: [phone]  [phone]

## 2015-02-01 NOTE — Patient Instructions (Signed)
Your physician recommends that you schedule a follow-up appointment in: AS NEEDED  

## 2015-03-05 ENCOUNTER — Ambulatory Visit: Payer: BC Managed Care – PPO | Admitting: Cardiology

## 2015-04-29 ENCOUNTER — Encounter: Payer: Self-pay | Admitting: *Deleted

## 2015-05-23 ENCOUNTER — Encounter: Payer: Self-pay | Admitting: Cardiovascular Disease

## 2015-05-29 DIAGNOSIS — B351 Tinea unguium: Secondary | ICD-10-CM

## 2015-05-29 DIAGNOSIS — Z2821 Immunization not carried out because of patient refusal: Secondary | ICD-10-CM

## 2015-05-29 HISTORY — DX: Immunization not carried out because of patient refusal: Z28.21

## 2015-05-29 HISTORY — DX: Tinea unguium: B35.1

## 2015-06-18 ENCOUNTER — Ambulatory Visit (INDEPENDENT_AMBULATORY_CARE_PROVIDER_SITE_OTHER): Payer: BC Managed Care – PPO | Admitting: Medical

## 2015-06-18 ENCOUNTER — Encounter: Payer: Self-pay | Admitting: Medical

## 2015-06-18 VITALS — BP 170/92 | HR 70 | Temp 98.4°F | Resp 15 | Wt 170.0 lb

## 2015-06-18 DIAGNOSIS — Z720 Tobacco use: Secondary | ICD-10-CM | POA: Diagnosis not present

## 2015-06-18 DIAGNOSIS — I10 Essential (primary) hypertension: Secondary | ICD-10-CM | POA: Diagnosis not present

## 2015-06-18 DIAGNOSIS — E785 Hyperlipidemia, unspecified: Secondary | ICD-10-CM

## 2015-06-18 DIAGNOSIS — B351 Tinea unguium: Secondary | ICD-10-CM

## 2015-06-18 DIAGNOSIS — R011 Cardiac murmur, unspecified: Secondary | ICD-10-CM | POA: Diagnosis not present

## 2015-06-18 DIAGNOSIS — Z2821 Immunization not carried out because of patient refusal: Secondary | ICD-10-CM

## 2015-06-18 MED ORDER — AMLODIPINE BESYLATE 10 MG PO TABS: 10.0000 mg | ORAL_TABLET | Freq: Every day | ORAL | Status: DC

## 2015-06-18 MED ORDER — TERBINAFINE HCL 250 MG PO TABS: 250.0000 mg | ORAL_TABLET | Freq: Every day | ORAL | Status: DC

## 2015-06-18 NOTE — Progress Notes (Signed)
Subjective: Here for med check/f/u.  Since last visit her husband Dorene Sorrow passed away.  I saw her at the hospital to offer support.  She is doing ok despite losing Dorene Sorrow.   She is compliant with BP medication Norvasc without c/o.   Has been on lisinopril prior but stopped due to cough.  Since last visit has stress per cardiology, everything was "normal" and was advised no particular f/u with them.  She has hx/o dyslipidemia but declines to take medication.  She does want to start medication for toenail fungus of both great toes.  Has been having yellowed toenails and the right great toenail does ache at times.  She notes BP being up today as she forgot to take her pil this morning.  No other aggravating or relieving factors. No other complaint.  Past Medical History  Diagnosis Date  . Allergic rhinitis, cause unspecified   . Essential hypertension     previously uncontrolled, now well-controlled; no evidence of renal artery stenosis  . Obesity   . Dyslipidemia   . GERD (gastroesophageal reflux disease)   . History of migraine headaches   . Arthritis   . History of cardiac catheterization December 2006    for exertional chest pain: Nonobstructive CAD  . Coronary artery disease, non-occlusive December 2006  . Pneumococcal vaccine refused 05/2015  . Onychomycosis 05/2015  . Heart murmur 12/2014  . Chewing tobacco use   . H/O echocardiogram 12/2014    60-65 % EF, mild basal hypertrophy focally at septum, otherwise normal  . History of cardiovascular stress test 12/2014    perfusion study; low risk study.  LV Wall Motion: NL LV Function; NL Wall Motion; LVEF 63%.  Dr. Rennis Golden   ROS as in subjective  Objective: BP 170/92 mmHg  Pulse 70  Temp(Src) 98.4 F (36.9 C) (Oral)  Resp 15  Wt 170 lb (77.111 kg)  General appearance: alert, no distress, WD/WN Neck: supple, no lymphadenopathy, no thyromegaly, no masses Heart: brief 2/6 faint systolic murmur heard best in left upper sternal border,  otherwise RRR, normal S1, S2  Lungs: CTA bilaterally, no wheezes, rhonchi, or rales Pulses: 2+ symmetric, upper and lower extremities, normal cap refill Great toenails have nail polish bilat, unable to fully examine.  Nails not particularly thick   Assessment: Encounter Diagnoses  Name Primary?  . Essential hypertension Yes  . Chewing tobacco use   . Onychomycosis   . Heart murmur   . Pneumococcal vaccine refused   . Dyslipidemia     Plan: HTN - c/t same medication.  She notes home readings 130s/80s consistent with prior readings in the last year.  reviewed 12/2014 stress testing and echo evaluation, updated chart.  Return in 6 wk for physical, fasting labs  Chewing tobacco - discussed the risks, she is not ready to quit  onychomycosis - discussed risks/benefits of Lamisil oral.   Begin Lamisil.  discussed usual time frame to see improvement.  F/u in 6wk for labs and physical  Heart murmur - reviewed 12/2014 echo  She declines pneumococcal vaccine  Dyslipidemia - refuses medication.  Discussed diet, exercise.

## 2015-08-19 ENCOUNTER — Telehealth: Payer: Self-pay | Admitting: Medical

## 2015-08-19 NOTE — Telephone Encounter (Signed)
Pt called and was wanting to know if you could send her in a fluid pill, said her ankles are swollen Pt uses [facility] off of [address], please call her at [phone] or let me know if i need to make her a appt, thanks

## 2015-08-19 NOTE — Telephone Encounter (Signed)
She is actually due back for physical, so go ahead and schedule.  Make sure thought that she is not having weight gain.  See if she is checking her weight daily, and has there been a 5lb gain in recent days?

## 2015-08-26 ENCOUNTER — Telehealth: Payer: Self-pay | Admitting: Medical

## 2015-08-26 NOTE — Telephone Encounter (Signed)
i called pt 3 times to schedule a appt and left messages and no response

## 2015-08-27 NOTE — Telephone Encounter (Signed)
Called patient back 3 times left voicemail to return my call but she hasnt done so,

## 2015-09-11 ENCOUNTER — Other Ambulatory Visit: Payer: Self-pay | Admitting: Medical

## 2015-10-30 ENCOUNTER — Ambulatory Visit (INDEPENDENT_AMBULATORY_CARE_PROVIDER_SITE_OTHER): Payer: BC Managed Care – PPO | Admitting: Medical

## 2015-10-30 ENCOUNTER — Encounter: Payer: Self-pay | Admitting: Medical

## 2015-10-30 VITALS — BP 140/70 | HR 63 | Ht 63.0 in | Wt 155.0 lb

## 2015-10-30 DIAGNOSIS — Z8249 Family history of ischemic heart disease and other diseases of the circulatory system: Secondary | ICD-10-CM

## 2015-10-30 DIAGNOSIS — Z0000 Encounter for general adult medical examination without abnormal findings: Secondary | ICD-10-CM

## 2015-10-30 DIAGNOSIS — Z2821 Immunization not carried out because of patient refusal: Secondary | ICD-10-CM | POA: Diagnosis not present

## 2015-10-30 DIAGNOSIS — Z1239 Encounter for other screening for malignant neoplasm of breast: Secondary | ICD-10-CM | POA: Insufficient documentation

## 2015-10-30 DIAGNOSIS — K219 Gastro-esophageal reflux disease without esophagitis: Secondary | ICD-10-CM

## 2015-10-30 DIAGNOSIS — E785 Hyperlipidemia, unspecified: Secondary | ICD-10-CM

## 2015-10-30 DIAGNOSIS — Z13820 Encounter for screening for osteoporosis: Secondary | ICD-10-CM

## 2015-10-30 DIAGNOSIS — Z720 Tobacco use: Secondary | ICD-10-CM

## 2015-10-30 DIAGNOSIS — Z7185 Encounter for immunization safety counseling: Secondary | ICD-10-CM

## 2015-10-30 DIAGNOSIS — Z7189 Other specified counseling: Secondary | ICD-10-CM

## 2015-10-30 DIAGNOSIS — I10 Essential (primary) hypertension: Secondary | ICD-10-CM

## 2015-10-30 DIAGNOSIS — R011 Cardiac murmur, unspecified: Secondary | ICD-10-CM | POA: Diagnosis not present

## 2015-10-30 LAB — POCT URINALYSIS DIPSTICK
Bilirubin, UA: NEGATIVE
Blood, UA: NEGATIVE
Glucose, UA: NEGATIVE
Ketones, UA: NEGATIVE
Leukocytes, UA: NEGATIVE
Nitrite, UA: NEGATIVE
Protein, UA: NEGATIVE
Spec Grav, UA: >=1.03
Urobilinogen, UA: NEGATIVE
pH, UA: 5

## 2015-10-30 LAB — COMPREHENSIVE METABOLIC PANEL
ALT: 24 U/L (ref 6–29)
AST: 32 U/L (ref 10–35)
Albumin: 4.1 g/dL (ref 3.6–5.1)
Alkaline Phosphatase: 64 U/L (ref 33–130)
BUN: 11 mg/dL (ref 7–25)
CO2: 26 mmol/L (ref 20–31)
Calcium: 9.1 mg/dL (ref 8.6–10.4)
Chloride: 102 mmol/L (ref 98–110)
Creat: 0.51 mg/dL (ref 0.50–0.99)
Glucose, Bld: 80 mg/dL (ref 65–99)
Potassium: 4.1 mmol/L (ref 3.5–5.3)
Sodium: 137 mmol/L (ref 135–146)
Total Bilirubin: 0.9 mg/dL (ref 0.2–1.2)
Total Protein: 7.3 g/dL (ref 6.1–8.1)

## 2015-10-30 LAB — CBC
HCT: 40.8 % (ref 36.0–46.0)
Hemoglobin: 14.2 g/dL (ref 12.0–15.0)
MCH: 32.4 pg (ref 26.0–34.0)
MCHC: 34.8 g/dL (ref 30.0–36.0)
MCV: 93.2 fL (ref 78.0–100.0)
MPV: 9 fL (ref 8.6–12.4)
Platelets: 194 10*3/uL (ref 150–400)
RBC: 4.38 MIL/uL (ref 3.87–5.11)
RDW: 13.2 % (ref 11.5–15.5)
WBC: 6.7 10*3/uL (ref 4.0–10.5)

## 2015-10-30 LAB — LIPID PANEL
Cholesterol: 181 mg/dL (ref 125–200)
HDL: 67 mg/dL (ref >=46)
LDL Cholesterol: 100 mg/dL (ref <130)
Total CHOL/HDL Ratio: 2.7 Ratio (ref <=5.0)
Triglycerides: 72 mg/dL (ref <150)
VLDL: 14 mg/dL (ref <30)

## 2015-10-30 LAB — HEMOGLOBIN A1C
Hgb A1c MFr Bld: 5.4 % (ref <5.7)
Mean Plasma Glucose: 108 mg/dL (ref <117)

## 2015-10-30 MED ORDER — AMLODIPINE BESYLATE 10 MG PO TABS: 10.0000 mg | ORAL_TABLET | Freq: Every day | ORAL | Status: DC

## 2015-10-30 MED ORDER — HYDROCHLOROTHIAZIDE 25 MG PO TABS: 25.0000 mg | ORAL_TABLET | Freq: Every day | ORAL | Status: DC

## 2015-10-30 NOTE — Progress Notes (Signed)
Subjective: Chief Complaint  Patient presents with  . Annual Exam    ate at 830. will give urine  before leaves. eyes checked within last year. declines flu shot. no other doctors. will wait on pelvic exam. no other concerns.   Concerns: none  Reviewed their medical, surgical, family, social, medication, and allergy history and updated chart as appropriate.  Past Medical History  Diagnosis Date  . Allergic rhinitis, cause unspecified   . Essential hypertension     previously uncontrolled, now well-controlled; no evidence of renal artery stenosis  . Obesity   . Dyslipidemia   . GERD (gastroesophageal reflux disease)   . History of migraine headaches   . Arthritis   . History of cardiac catheterization December 2006    for exertional chest pain: Nonobstructive CAD  . Coronary artery disease, non-occlusive December 2006  . Pneumococcal vaccine refused 05/2015  . Onychomycosis 05/2015  . Chewing tobacco use   . H/O echocardiogram 12/2014    65-65 % EF, mild basal hypertrophy focally at septum, otherwise normal  . History of cardiovascular stress test 12/2014    perfusion study; low risk study.  LV Wall Motion: NL LV Function; NL Wall Motion; LVEF 63%.  Dr. Rennis Golden  . Heart murmur 12/2014    mild focal hypertrophy of septum, EF 60-65%, trivial aortic regurgitation    Past Surgical History  Procedure Laterality Date  . Abdominal hysterectomy      PARTIAL (benign, for bleeding)  . Laparoscopy  12/02    ovarian cyst  . Cardiac catheterization  12/06    Dr. Clarene Duke; no significant CAD. Normal LV function with EF of 60-65%. Also noted no RAS.  Marland Kitchen Esophagogastroduodenoscopy  2009    Dr. Loreta Ave  . Colonoscopy  2009    Dr. Loreta Ave  . Carotid angiogram  2005    Social History   Social History  . Marital Status: Married    Spouse Name: N/A  . Number of Children: 3  . Years of Education: N/A   Occupational History  . 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 Smoking status: Never Smoker   . Smokeless tobacco: Current User    Types: Snuff     Comment: hhas been using since she was a teenager  . Alcohol Use: 1.8 oz/week    3 Glasses of wine per week     Comment: 12 drinks per week.  . Drug Use: No  . Sexual Activity: No   Other Topics Concern  . Not on file   Social History Narrative   Widowed 2016.  Has new boyfriend as of 10/2015, just started riding motorcycle, trying to get new enjoyment in life since being widowed.        Was married 43 years with Deeann Cree.       Has been dipping snuff since she was a teenager.      She drinks 2-3 Sherilyn Dacosta Beers most days out of the week      She "does not do as much exercise as she should, walking maybe 1 day week.    Family History  Problem Relation Age of Onset  . Heart disease Mother     CHF  . Hypertension Mother   . Heart disease Father 37    MI  . Heart disease Brother   . Heart disease Maternal Aunt   . Diabetes Maternal Aunt   . Cancer Maternal Aunt  cervical  . Heart disease Maternal Uncle   . Diabetes Maternal Uncle   . Heart disease Maternal Grandmother     CHF  . Diabetes Maternal Grandmother   . Heart disease Paternal Uncle   . Heart disease Brother   . Heart disease Brother      Current outpatient prescriptions:  .  amLODipine (NORVASC) 10 MG tablet, TAKE 1 TABLET(10 MG) BY MOUTH DAILY, Disp: 90 tablet, Rfl: 0 .  cetirizine (ZYRTEC) 10 MG tablet, Take 10 mg by mouth as needed. , Disp: , Rfl:  .  terbinafine (LAMISIL) 250 MG tablet, Take 1 tablet (250 mg total) by mouth daily. (Patient not taking: Reported on 10/30/2015), Disp: 45 tablet, Rfl: 0  Allergies  Allergen Reactions  . Morphine And Related Other (See Comments)    Palpitations and shaking.    Review of Systems Constitutional: -fever, -chills, -sweats, -unexpected weight change, -decreased appetite, -fatigue Allergy: -sneezing, -itching, -congestion Dermatology: -changing moles, --rash,  -lumps ENT: -runny nose, -ear pain, -sore throat, -hoarseness, -sinus pain, -teeth pain, - ringing in ears, -hearing loss, -nosebleeds Cardiology: -chest pain, -palpitations, -swelling, -difficulty breathing when lying flat, -waking up short of breath Respiratory: -cough, -shortness of breath, -difficulty breathing with exercise or exertion, -wheezing, -coughing up blood Gastroenterology: -abdominal pain, -nausea, -vomiting, -diarrhea, -constipation, -blood in stool, -changes in bowel movement, -difficulty swallowing or eating Hematology: -bleeding, -bruising  Musculoskeletal: -joint aches, -muscle aches, -joint swelling, -back pain, -neck pain, -cramping, -changes in gait Ophthalmology: denies vision changes, eye redness, itching, discharge Urology: -burning with urination, -difficulty urinating, -blood in urine, -urinary frequency, -urgency, -incontinence Neurology: -headache, -weakness, -tingling, -numbness, -memory loss, -falls, -dizziness Psychology: -depressed mood, -agitation, -sleep problems     Objective:   Physical Exam BP 140/70 mmHg  Pulse 63  Ht  (1.6 m)  Wt 155 lb (70.308 kg)  BMI 27.46 kg/m2  General appearance: alert, no distress, WD/WN, white female Skin: scattered macules, no worrisome lesions HEENT: normocephalic, conjunctiva/corneas normal, sclerae anicteric, PERRLA, EOMi, nares patent, no discharge or erythema, pharynx normal Oral cavity: MMM, tongue normal, teeth normal Neck: supple, no lymphadenopathy, no thyromegaly, no masses, normal ROM Chest: non tender, normal shape and expansion Heart: RRR, normal S1, S2, no murmurs Lungs: CTA bilaterally, no wheezes, rhonchi, or rales Abdomen: +bs, soft, non tender, non distended, no masses, no hepatomegaly, no splenomegaly, no bruits Back: non tender, normal ROM, no scoliosis Musculoskeletal: upper extremities non tender, no obvious deformity, normal ROM throughout, lower extremities non tender, no obvious deformity,  normal ROM throughout Extremities: no edema, no cyanosis, no clubbing Pulses: 2+ symmetric, upper and lower extremities, normal cap refill Neurological: alert, oriented x 3, CN2-12 intact, strength normal upper extremities and lower extremities, sensation normal throughout, DTRs 2+ throughout, no cerebellar signs, gait normal Psychiatric: normal affect, behavior normal, pleasant  Breast/gyn/rectal - deferred to gyn   Assessment and Plan :    Encounter Diagnoses  Name Primary?  . Encounter for health maintenance examination in adult Yes  . Essential hypertension   . Smokeless tobacco use   . Screening for osteoporosis   . Dyslipidemia   . Pneumococcal vaccine refused   . Heart murmur   . Chewing tobacco use   . Family history of premature CAD   . Gastroesophageal reflux disease without esophagitis   . Vaccine counseling   . Screening for breast cancer     Physical exam - discussed healthy lifestyle, diet, exercise, preventative care, vaccinations, and addressed their concerns.  Handout given.  Labs today She declines influenza vacccine today Begin HCTZ at her request for edema and to help improve BP.  Recommendations:  See your eye doctor yearly for routine vision care.  See your dentist yearly for routine dental care including hygiene visits twice yearly.  Return soon for breast and pelvic exam with myself or Hetty BlendVickie Henson, NP  Check insurance coverage for the Shingles vaccine/Zostavax and return for this ASAP  Check insurance coverage for Prevnar 13, as you will be due for this at age 64  We will refer you for your first bone density exam for osteoporosis screening  Get your mammogram yearly  We will call with lab results  EXERCISE daily!  Eat a healthy low fat diet  Continue efforts to lose weight  Drink 1 or less alcohol beverages daily  STOP tobacco!  Check your blood pressure a few times a week  Follow-up pending labs

## 2015-10-30 NOTE — Patient Instructions (Signed)
  Thank you for giving me the opportunity to serve you today.    Your diagnosis today includes: Encounter Diagnoses  Name Primary?  . Encounter for health maintenance examination in adult Yes  . Essential hypertension   . Smokeless tobacco use   . Screening for osteoporosis   . Dyslipidemia   . Pneumococcal vaccine refused   . Heart murmur   . Chewing tobacco use   . Family history of premature CAD   . Gastroesophageal reflux disease without esophagitis   . Vaccine counseling   . Screening for breast cancer     Recommendations:  See your eye doctor yearly for routine vision care.  See your dentist yearly for routine dental care including hygiene visits twice yearly.  Return soon for breast and pelvic exam with myself or Hetty BlendVickie Henson, NP  Check insurance coverage for the Shingles vaccine/Zostavax and return for this ASAP  Check insurance coverage for Prevnar 13, as you will be due for this at age 64  We will refer you for your first bone density exam for osteoporosis screening  Get your mammogram yearly  We will call with lab results  EXERCISE daily!  Eat a healthy low fat diet  Continue efforts to lose weight  Drink 1 or less alcohol beverages daily  STOP tobacco!  Check your blood pressure a few times a week

## 2015-10-31 ENCOUNTER — Other Ambulatory Visit: Payer: Self-pay | Admitting: Medical

## 2015-10-31 LAB — TSH: TSH: 0.473 u[IU]/mL (ref 0.350–4.500)

## 2015-10-31 LAB — MICROALBUMIN / CREATININE URINE RATIO
Creatinine, Urine: 69 mg/dL (ref 20–320)
Microalb Creat Ratio: 7 mcg/mg creat (ref <30)
Microalb, Ur: 0.5 mg/dL

## 2015-10-31 LAB — VITAMIN D 25 HYDROXY (VIT D DEFICIENCY, FRACTURES): Vit D, 25-Hydroxy: 16 ng/mL — ABNORMAL LOW (ref 30–100)

## 2015-10-31 MED ORDER — VITAMIN D (ERGOCALCIFEROL) 1.25 MG (50000 UNIT) PO CAPS: 50000.0000 [IU] | ORAL_CAPSULE | ORAL | Status: DC

## 2015-11-01 NOTE — Addendum Note (Signed)
Addended by: Herminio CommonsJOHNSON, Olimpia Tinch A on: 11/01/2015 10:35 AM   Modules accepted: Kipp BroodSmartSet

## 2016-03-18 ENCOUNTER — Emergency Department (HOSPITAL_BASED_OUTPATIENT_CLINIC_OR_DEPARTMENT_OTHER)
Admission: EM | Admit: 2016-03-18 | Discharge: 2016-03-18 | Disposition: A | Discharge status: Home / Self Care | Payer: BC Managed Care – PPO | Attending: Emergency Medicine | Admitting: Emergency Medicine

## 2016-03-18 ENCOUNTER — Emergency Department (HOSPITAL_BASED_OUTPATIENT_CLINIC_OR_DEPARTMENT_OTHER): Payer: BC Managed Care – PPO

## 2016-03-18 ENCOUNTER — Encounter (HOSPITAL_BASED_OUTPATIENT_CLINIC_OR_DEPARTMENT_OTHER): Payer: Self-pay | Admitting: Emergency Medicine

## 2016-03-18 DIAGNOSIS — R109 Unspecified abdominal pain: Secondary | ICD-10-CM

## 2016-03-18 DIAGNOSIS — I2510 Atherosclerotic heart disease of native coronary artery without angina pectoris: Secondary | ICD-10-CM | POA: Insufficient documentation

## 2016-03-18 DIAGNOSIS — B373 Candidiasis of vulva and vagina: Secondary | ICD-10-CM | POA: Insufficient documentation

## 2016-03-18 DIAGNOSIS — N760 Acute vaginitis: Secondary | ICD-10-CM | POA: Insufficient documentation

## 2016-03-18 DIAGNOSIS — Z8719 Personal history of other diseases of the digestive system: Secondary | ICD-10-CM | POA: Diagnosis not present

## 2016-03-18 DIAGNOSIS — M1990 Unspecified osteoarthritis, unspecified site: Secondary | ICD-10-CM | POA: Diagnosis not present

## 2016-03-18 DIAGNOSIS — R011 Cardiac murmur, unspecified: Secondary | ICD-10-CM | POA: Insufficient documentation

## 2016-03-18 DIAGNOSIS — Z8619 Personal history of other infectious and parasitic diseases: Secondary | ICD-10-CM | POA: Insufficient documentation

## 2016-03-18 DIAGNOSIS — I10 Essential (primary) hypertension: Secondary | ICD-10-CM | POA: Diagnosis not present

## 2016-03-18 DIAGNOSIS — B9689 Other specified bacterial agents as the cause of diseases classified elsewhere: Secondary | ICD-10-CM

## 2016-03-18 DIAGNOSIS — B3731 Acute candidiasis of vulva and vagina: Secondary | ICD-10-CM

## 2016-03-18 DIAGNOSIS — E669 Obesity, unspecified: Secondary | ICD-10-CM | POA: Diagnosis not present

## 2016-03-18 DIAGNOSIS — Z9889 Other specified postprocedural states: Secondary | ICD-10-CM | POA: Diagnosis not present

## 2016-03-18 LAB — URINALYSIS, ROUTINE W REFLEX MICROSCOPIC
Glucose, UA: NEGATIVE mg/dL
Ketones, ur: 15 mg/dL — AB
Nitrite: NEGATIVE
Protein, ur: NEGATIVE mg/dL
Specific Gravity, Urine: 1.029 (ref 1.005–1.030)
pH: 5.5 (ref 5.0–8.0)

## 2016-03-18 LAB — URINE MICROSCOPIC-ADD ON

## 2016-03-18 LAB — CBC WITH DIFFERENTIAL/PLATELET
Basophils Absolute: 0 10*3/uL (ref 0.0–0.1)
Basophils Relative: 0 %
Eosinophils Absolute: 0.1 10*3/uL (ref 0.0–0.7)
Eosinophils Relative: 2 %
HCT: 38.4 % (ref 36.0–46.0)
Hemoglobin: 13.1 g/dL (ref 12.0–15.0)
Lymphocytes Relative: 21 %
Lymphs Abs: 1.6 10*3/uL (ref 0.7–4.0)
MCH: 33.3 pg (ref 26.0–34.0)
MCHC: 34.1 g/dL (ref 30.0–36.0)
MCV: 97.7 fL (ref 78.0–100.0)
Monocytes Absolute: 0.8 10*3/uL (ref 0.1–1.0)
Monocytes Relative: 11 %
Neutro Abs: 5 10*3/uL (ref 1.7–7.7)
Neutrophils Relative %: 67 %
Platelets: 148 10*3/uL — ABNORMAL LOW (ref 150–400)
RBC: 3.93 MIL/uL (ref 3.87–5.11)
RDW: 11.7 % (ref 11.5–15.5)
WBC: 7.6 10*3/uL (ref 4.0–10.5)

## 2016-03-18 LAB — BASIC METABOLIC PANEL
Anion gap: 8 (ref 5–15)
BUN: 15 mg/dL (ref 6–20)
CO2: 26 mmol/L (ref 22–32)
Calcium: 8.7 mg/dL — ABNORMAL LOW (ref 8.9–10.3)
Chloride: 103 mmol/L (ref 101–111)
Creatinine, Ser: 0.49 mg/dL (ref 0.44–1.00)
GFR calc Af Amer: >60 mL/min (ref >60)
GFR calc non Af Amer: >60 mL/min (ref >60)
Glucose, Bld: 99 mg/dL (ref 65–99)
Potassium: 3.8 mmol/L (ref 3.5–5.1)
Sodium: 137 mmol/L (ref 135–145)

## 2016-03-18 LAB — WET PREP, GENITAL
Sperm: NONE SEEN
Trich, Wet Prep: NONE SEEN

## 2016-03-18 MED ORDER — HYDROCODONE-ACETAMINOPHEN 5-325 MG PO TABS: 2.0000 | ORAL_TABLET | ORAL | Status: DC | PRN

## 2016-03-18 MED ORDER — ONDANSETRON HCL 4 MG PO TABS: 4.0000 mg | ORAL_TABLET | Freq: Three times a day (TID) | ORAL | Status: DC | PRN

## 2016-03-18 MED ORDER — ONDANSETRON HCL 4 MG/2ML IJ SOLN
4.0000 mg | Freq: Once | INTRAMUSCULAR | Status: DC
  Filled 2016-03-18: qty 2

## 2016-03-18 MED ORDER — HYDROMORPHONE HCL 1 MG/ML IJ SOLN
1.0000 mg | Freq: Once | INTRAMUSCULAR | Status: DC
  Filled 2016-03-18: qty 1

## 2016-03-18 MED ORDER — METRONIDAZOLE 0.75 % VA GEL: 1.0000 | Freq: Two times a day (BID) | VAGINAL | Status: DC

## 2016-03-18 MED ORDER — FLUCONAZOLE 150 MG PO TABS
150.0000 mg | ORAL_TABLET | Freq: Once | ORAL | Status: DC
Start: 2016-03-18 — End: 2016-08-06

## 2016-03-18 NOTE — ED Notes (Signed)
Patient reports that she started to have pain to her right flank into her right groin about 2 hours ago. The patient reports Nausea

## 2016-03-18 NOTE — ED Provider Notes (Signed)
CSN: [id number]     Arrival date & time 03/18/16  1318 History   First MD Initiated Contact with Patient 03/18/16 1444     Chief Complaint  Patient presents with  . Flank Pain     (Consider location/radiation/quality/duration/timing/severity/associated sxs/prior Treatment) HPI   [name] is a(n) 65 y.o. female who presents to the ED with cc of Flank pain. Patient states that she was at work today around 12:30 when she sudden onset left flank pain which radiated into her left abdomen, groin and labia. She states that it was so severe she felt like she's can pass out. She states she's having a sensation of pressure. She denies any urinary symptoms. She states that her pain is improved but still moderate. She had associated nausea at the time that her pain started. She denies any twisting or other motions which may have caused injury. She denies a history of kidney stones. Her daughter states that the patient has a new boyfriend and is newly sexually active again and thinks that her mother should get tested for STDs. Patient agrees to testing. She denies any vaginal symptoms such as discharge, pain with intercourse or foul odor. She has a history of partial hysterectomy. She is unsure if she has a cervix.  Past Medical History  Diagnosis Date  . Allergic rhinitis, cause unspecified   . Essential hypertension     previously uncontrolled, now well-controlled; no evidence of renal artery stenosis  . Obesity   . Dyslipidemia   . GERD (gastroesophageal reflux disease)   . History of migraine headaches   . Arthritis   . History of cardiac catheterization December 2006    for exertional chest pain: Nonobstructive CAD  . Coronary artery disease, non-occlusive December 2006  . Pneumococcal vaccine refused 05/2015  . Onychomycosis 05/2015  . Chewing tobacco use   . H/O echocardiogram 12/2014    60-65 % EF, mild basal hypertrophy focally at septum, otherwise normal  . History of cardiovascular  stress test 12/2014    perfusion study; low risk study.  LV Wall Motion: NL LV Function; NL Wall Motion; LVEF 63%.  Dr. Rennis GoldenHilty  . Heart murmur 12/2014    mild focal hypertrophy of septum, EF 60-65%, trivial aortic regurgitation   Past Surgical History  Procedure Laterality Date  . Abdominal hysterectomy      PARTIAL (benign, for bleeding)  . Laparoscopy  12/02    ovarian cyst  . Cardiac catheterization  12/06    Dr. Clarene DukeLittle; no significant CAD. Normal LV function with EF of 60-65%. Also noted no RAS.  Marland Kitchen. Esophagogastroduodenoscopy  2009    Dr. Loreta AveMann  . Colonoscopy  2009    Dr. Loreta AveMann  . Carotid angiogram  2005   Family History  Problem Relation Age of Onset  . Heart disease Mother     CHF  . Hypertension Mother   . Heart disease Father [id number]    MI  . Heart disease Brother   . Heart disease Maternal Aunt   . Diabetes Maternal Aunt   . Cancer Maternal Aunt     cervical  . Heart disease Maternal Uncle   . Diabetes Maternal Uncle   . Heart disease Maternal Grandmother     CHF  . Diabetes Maternal Grandmother   . Heart disease Paternal Uncle   . Heart disease Brother   . Heart disease Brother    Social History  Substance Use Topics  . Smoking status: Never Smoker   .  Smokeless tobacco: Current User    Types: Snuff     Comment: hhas been using since she was a teenager  . Alcohol Use: 1.8 oz/week    3 Glasses of wine per week     Comment: 12 drinks per week.   OB History    No data available     Review of Systems  Ten systems reviewed and are negative for acute change, except as noted in the HPI.    Allergies  Morphine and related  Home Medications   Prior to Admission medications   Medication Sig Start Date End Date Taking? Authorizing Provider  amLODipine (NORVASC) 10 MG tablet Take 1 tablet (10 mg total) by mouth daily. 10/30/15   Kermit Balo Tysinger, PA-C  cetirizine (ZYRTEC) 10 MG tablet Take 10 mg by mouth as needed.     Historical Provider, MD   hydrochlorothiazide (HYDRODIURIL) 25 MG tablet Take 1 tablet (25 mg total) by mouth daily. 10/30/15   Kermit Balo Tysinger, PA-C  terbinafine (LAMISIL) 250 MG tablet Take 1 tablet (250 mg total) by mouth daily. Patient not taking: Reported on 10/30/2015 06/18/15   Kermit Balo Tysinger, PA-C  Vitamin D, Ergocalciferol, (DRISDOL) 50000 UNITS CAPS capsule Take 1 capsule (50,000 Units total) by mouth every 7 (seven) days. 10/31/15   Kermit Balo Tysinger, PA-C   BP 132/70 mmHg  Pulse 76  Temp(Src) 98.7 F (37.1 C) (Oral)  Resp 18  Ht  (1.575 m)  Wt 66.225 kg  BMI 26.70 kg/m2  SpO2 98% Physical Exam  Constitutional: She is oriented to person, place, and time. She appears well-developed and well-nourished. No distress.  HENT:  Head: Normocephalic and atraumatic.  Eyes: Conjunctivae are normal. No scleral icterus.  Neck: Normal range of motion.  Cardiovascular: Normal rate, regular rhythm and normal heart sounds.  Exam reveals no gallop and no friction rub.   No murmur heard. Pulmonary/Chest: Effort normal and breath sounds normal. No respiratory distress.  Abdominal: Soft. Bowel sounds are normal. She exhibits no distension and no mass. There is no tenderness. There is no guarding.  Genitourinary:  Pelvic exam: normal external genitalia, vulva, vagina. Cervix is surgically absent.   Neurological: She is alert and oriented to person, place, and time.  Skin: Skin is warm and dry. She is not diaphoretic.  Nursing note and vitals reviewed.   ED Course  Procedures (including critical care time) Labs Review Labs Reviewed  URINALYSIS, ROUTINE W REFLEX MICROSCOPIC (NOT AT [facility]) - Abnormal; Notable for the following:    Color, Urine AMBER (*)    APPearance CLOUDY (*)    Hgb urine dipstick SMALL (*)    Bilirubin Urine SMALL (*)    Ketones, ur 15 (*)    Leukocytes, UA LARGE (*)    All other components within normal limits  URINE MICROSCOPIC-ADD ON - Abnormal; Notable for the following:    Squamous  Epithelial / LPF 6-30 (*)    Bacteria, UA FEW (*)    Crystals CA OXALATE CRYSTALS (*)    All other components within normal limits  BASIC METABOLIC PANEL  CBC WITH DIFFERENTIAL/PLATELET  RPR  HIV ANTIBODY (ROUTINE TESTING)  GC/CHLAMYDIA PROBE AMP ([place]) NOT AT [facility]    Imaging Review No results found. I have personally reviewed and evaluated these images and lab results as part of my medical decision-making.   EKG Interpretation None      MDM   Final diagnoses:  Flank pain    Patient urine with  hematuria and calcium oxalate crystals. Given her history and symptoms I suspect a kidney stone, although CT scan is negative. Patient did urinate here, although she passed the stone. Pelvic exam is benign and shows only some clue cells and yeast. She'll be treated for BV and yeast infection. Her pain is improved. We'll discharge with some pain medication, nausea medicine and treatment as stated above. She appears safe for discharge at this time    Arthor Captain, PA-C 03/18/16 1640  Jerelyn Scott, MD 03/19/16 [phone]

## 2016-03-18 NOTE — Discharge Instructions (Signed)
Your CT scan did not show a kidney stone, however, you did urinate and I suspect you've passed a kidney stone given your symptoms and your urinalysis. Discharging you with pain medications and nausea medications in case her symptoms should return. Otherwise, this may be a muscle spasm and advised to take Motrin) heating pad on her back. Otherwise, reexamination showed yeast infection and bacterial vaginosis for which I'm treating you. Please follow up with your primary care physician next 2-3 days. Bacterial Vaginosis Bacterial vaginosis is a vaginal infection that occurs when the normal balance of bacteria in the vagina is disrupted. It results from an overgrowth of certain bacteria. This is the most common vaginal infection in women of childbearing age. Treatment is important to prevent complications, especially in pregnant women, as it can cause a premature delivery. CAUSES  Bacterial vaginosis is caused by an increase in harmful bacteria that are normally present in smaller amounts in the vagina. Several different kinds of bacteria can cause bacterial vaginosis. However, the reason that the condition develops is not fully understood. RISK FACTORS Certain activities or behaviors can put you at an increased risk of developing bacterial vaginosis, including:  Having a new sex partner or multiple sex partners.  Douching.  Using an intrauterine device (IUD) for contraception. Women do not get bacterial vaginosis from toilet seats, bedding, swimming pools, or contact with objects around them. SIGNS AND SYMPTOMS  Some women with bacterial vaginosis have no signs or symptoms. Common symptoms include:  Grey vaginal discharge.  A fishlike odor with discharge, especially after sexual intercourse.  Itching or burning of the vagina and vulva.  Burning or pain with urination. DIAGNOSIS  Your health care provider will take a medical history and examine the vagina for signs of bacterial vaginosis. A  sample of vaginal fluid may be taken. Your health care provider will look at this sample under a microscope to check for bacteria and abnormal cells. A vaginal pH test may also be done.  TREATMENT  Bacterial vaginosis may be treated with antibiotic medicines. These may be given in the form of a pill or a vaginal cream. A second round of antibiotics may be prescribed if the condition comes back after treatment. Because bacterial vaginosis increases your risk for sexually transmitted diseases, getting treated can help reduce your risk for chlamydia, gonorrhea, HIV, and herpes. HOME CARE INSTRUCTIONS   Only take over-the-counter or prescription medicines as directed by your health care provider.  If antibiotic medicine was prescribed, take it as directed. Make sure you finish it even if you start to feel better.  Tell all sexual partners that you have a vaginal infection. They should see their health care provider and be treated if they have problems, such as a mild rash or itching.  During treatment, it is important that you follow these instructions:  Avoid sexual activity or use condoms correctly.  Do not douche.  Avoid alcohol as directed by your health care provider.  Avoid breastfeeding as directed by your health care provider. SEEK MEDICAL CARE IF:   Your symptoms are not improving after 3 days of treatment.  You have increased discharge or pain.  You have a fever. MAKE SURE YOU:   Understand these instructions.  Will watch your condition.  Will get help right away if you are not doing well or get worse. FOR MORE INFORMATION  Centers for Disease Control and Prevention, Division of STD Prevention: SolutionApps.[employer].za American Sexual Health Association (ASHA): www.ashastd.org    This information  is not intended to replace advice given to you by your health care provider. Make sure you discuss any questions you have with your health care provider.   Document Released: 12/14/2005  Document Revised: 01/04/2015 Document Reviewed: 07/26/2013 Elsevier Interactive Patient Education 2016 Elsevier Inc.  Flank Pain Flank pain refers to pain that is located on the side of the body between the upper abdomen and the back. The pain may occur over a short period of time (acute) or may be long-term or reoccurring (chronic). It may be mild or severe. Flank pain can be caused by many things. CAUSES  Some of the more common causes of flank pain include:  Muscle strains.   Muscle spasms.   A disease of your spine (vertebral disk disease).   A lung infection (pneumonia).   Fluid around your lungs (pulmonary edema).   A kidney infection.   Kidney stones.   A very painful skin rash caused by the chickenpox virus (shingles).   Gallbladder disease.  HOME CARE INSTRUCTIONS  Home care will depend on the cause of your pain. In general,  Rest as directed by your caregiver.  Drink enough fluids to keep your urine clear or pale yellow.  Only take over-the-counter or prescription medicines as directed by your caregiver. Some medicines may help relieve the pain.  Tell your caregiver about any changes in your pain.  Follow up with your caregiver as directed. SEEK IMMEDIATE MEDICAL CARE IF:   Your pain is not controlled with medicine.   You have new or worsening symptoms.  Your pain increases.   You have abdominal pain.   You have shortness of breath.   You have persistent nausea or vomiting.   You have swelling in your abdomen.   You feel faint or pass out.   You have blood in your urine.  You have a fever or persistent symptoms for more than 2-3 days.  You have a fever and your symptoms suddenly get worse. MAKE SURE YOU:   Understand these instructions.  Will watch your condition.  Will get help right away if you are not doing well or get worse.   This information is not intended to replace advice given to you by your health care provider.  Make sure you discuss any questions you have with your health care provider.   Document Released: 02/04/2006 Document Revised: 09/07/2012 Document Reviewed: 07/28/2012 Elsevier Interactive Patient Education 2016 Elsevier Inc.  Monilial Vaginitis Vaginitis in a soreness, swelling and redness (inflammation) of the vagina and vulva. Monilial vaginitis is not a sexually transmitted infection. CAUSES  Yeast vaginitis is caused by yeast (candida) that is normally found in your vagina. With a yeast infection, the candida has overgrown in number to a point that upsets the chemical balance. SYMPTOMS   White, thick vaginal discharge.  Swelling, itching, redness and irritation of the vagina and possibly the lips of the vagina (vulva).  Burning or painful urination.  Painful intercourse. DIAGNOSIS  Things that may contribute to monilial vaginitis are:  Postmenopausal and virginal states.  Pregnancy.  Infections.  Being tired, sick or stressed, especially if you had monilial vaginitis in the past.  Diabetes. Good control will help lower the chance.  Birth control pills.  Tight fitting garments.  Using bubble bath, feminine sprays, douches or deodorant tampons.  Taking certain medications that kill germs (antibiotics).  Sporadic recurrence can occur if you become ill. TREATMENT  Your caregiver will give you medication.  There are several kinds of anti  monilial vaginal creams and suppositories specific for monilial vaginitis. For recurrent yeast infections, use a suppository or cream in the vagina 2 times a week, or as directed.  Anti-monilial or steroid cream for the itching or irritation of the vulva may also be used. Get your caregiver's permission.  Painting the vagina with methylene blue solution may help if the monilial cream does not work.  Eating yogurt may help prevent monilial vaginitis. HOME CARE INSTRUCTIONS   Finish all medication as prescribed.  Do not have sex  until treatment is completed or after your caregiver tells you it is okay.  Take warm sitz baths.  Do not douche.  Do not use tampons, especially scented ones.  Wear cotton underwear.  Avoid tight pants and panty hose.  Tell your sexual partner that you have a yeast infection. They should go to their caregiver if they have symptoms such as mild rash or itching.  Your sexual partner should be treated as well if your infection is difficult to eliminate.  Practice safer sex. Use condoms.  Some vaginal medications cause latex condoms to fail. Vaginal medications that harm condoms are:  Cleocin cream.  Butoconazole (Femstat).  Terconazole (Terazol) vaginal suppository.  Miconazole (Monistat) (may be purchased over the counter). SEEK MEDICAL CARE IF:   You have a temperature by mouth above 102 F (38.9 C).  The infection is getting worse after 2 days of treatment.  The infection is not getting better after 3 days of treatment.  You develop blisters in or around your vagina.  You develop vaginal bleeding, and it is not your menstrual period.  You have pain when you urinate.  You develop intestinal problems.  You have pain with sexual intercourse.   This information is not intended to replace advice given to you by your health care provider. Make sure you discuss any questions you have with your health care provider.   Document Released: 09/23/2005 Document Revised: 03/07/2012 Document Reviewed: 06/17/2015 Elsevier Interactive Patient Education [employer]

## 2016-03-19 LAB — GC/CHLAMYDIA PROBE AMP (~~LOC~~) NOT AT ARMC
Chlamydia: NEGATIVE
Neisseria Gonorrhea: NEGATIVE

## 2016-03-19 LAB — HIV ANTIBODY (ROUTINE TESTING W REFLEX): HIV Screen 4th Generation wRfx: NONREACTIVE

## 2016-03-19 LAB — RPR: RPR Ser Ql: NONREACTIVE

## 2016-03-26 MED FILL — ONDANSETRON HCL 4 MG TABLET: 4 | 4 days supply | Qty: 10 | Fill #0

## 2016-03-26 MED FILL — HYDROCODON-APAP 5-325: 5-325 | 2 days supply | Qty: 10 | Fill #0

## 2016-08-01 ENCOUNTER — Emergency Department (HOSPITAL_COMMUNITY): Payer: BC Managed Care – PPO

## 2016-08-01 ENCOUNTER — Emergency Department (HOSPITAL_COMMUNITY)
Admission: EM | Admit: 2016-08-01 | Discharge: 2016-08-01 | Disposition: A | Discharge status: Home / Self Care | Payer: BC Managed Care – PPO | Attending: Emergency Medicine | Admitting: Emergency Medicine

## 2016-08-01 ENCOUNTER — Encounter (HOSPITAL_COMMUNITY): Payer: Self-pay | Admitting: Oncology

## 2016-08-01 DIAGNOSIS — Y999 Unspecified external cause status: Secondary | ICD-10-CM | POA: Insufficient documentation

## 2016-08-01 DIAGNOSIS — W228XXA Striking against or struck by other objects, initial encounter: Secondary | ICD-10-CM | POA: Insufficient documentation

## 2016-08-01 DIAGNOSIS — Z5181 Encounter for therapeutic drug level monitoring: Secondary | ICD-10-CM | POA: Diagnosis not present

## 2016-08-01 DIAGNOSIS — Y929 Unspecified place or not applicable: Secondary | ICD-10-CM | POA: Insufficient documentation

## 2016-08-01 DIAGNOSIS — I10 Essential (primary) hypertension: Secondary | ICD-10-CM | POA: Diagnosis not present

## 2016-08-01 DIAGNOSIS — Y939 Activity, unspecified: Secondary | ICD-10-CM | POA: Insufficient documentation

## 2016-08-01 DIAGNOSIS — E871 Hypo-osmolality and hyponatremia: Secondary | ICD-10-CM | POA: Diagnosis not present

## 2016-08-01 DIAGNOSIS — Z79899 Other long term (current) drug therapy: Secondary | ICD-10-CM | POA: Insufficient documentation

## 2016-08-01 DIAGNOSIS — F10120 Alcohol abuse with intoxication, uncomplicated: Secondary | ICD-10-CM | POA: Insufficient documentation

## 2016-08-01 DIAGNOSIS — F17290 Nicotine dependence, other tobacco product, uncomplicated: Secondary | ICD-10-CM | POA: Insufficient documentation

## 2016-08-01 DIAGNOSIS — F10920 Alcohol use, unspecified with intoxication, uncomplicated: Secondary | ICD-10-CM

## 2016-08-01 DIAGNOSIS — S0101XA Laceration without foreign body of scalp, initial encounter: Secondary | ICD-10-CM

## 2016-08-01 DIAGNOSIS — S0990XA Unspecified injury of head, initial encounter: Secondary | ICD-10-CM

## 2016-08-01 DIAGNOSIS — E876 Hypokalemia: Secondary | ICD-10-CM

## 2016-08-01 LAB — CBC WITH DIFFERENTIAL/PLATELET
Basophils Absolute: 0 10*3/uL (ref 0.0–0.1)
Basophils Relative: 0 %
Eosinophils Absolute: 0.1 10*3/uL (ref 0.0–0.7)
Eosinophils Relative: 1 %
HCT: 42.4 % (ref 36.0–46.0)
Hemoglobin: 14.9 g/dL (ref 12.0–15.0)
Lymphocytes Relative: 28 %
Lymphs Abs: 2.3 10*3/uL (ref 0.7–4.0)
MCH: 33.4 pg (ref 26.0–34.0)
MCHC: 35.1 g/dL (ref 30.0–36.0)
MCV: 95.1 fL (ref 78.0–100.0)
Monocytes Absolute: 1 10*3/uL (ref 0.1–1.0)
Monocytes Relative: 13 %
Neutro Abs: 4.8 10*3/uL (ref 1.7–7.7)
Neutrophils Relative %: 58 %
Platelets: 154 10*3/uL (ref 150–400)
RBC: 4.46 MIL/uL (ref 3.87–5.11)
RDW: 12.2 % (ref 11.5–15.5)
WBC: 8.3 10*3/uL (ref 4.0–10.5)

## 2016-08-01 LAB — COMPREHENSIVE METABOLIC PANEL
ALT: 22 U/L (ref 14–54)
AST: 31 U/L (ref 15–41)
Albumin: 4.4 g/dL (ref 3.5–5.0)
Alkaline Phosphatase: 71 U/L (ref 38–126)
Anion gap: 12 (ref 5–15)
BUN: 6 mg/dL (ref 6–20)
CO2: 25 mmol/L (ref 22–32)
Calcium: 9.3 mg/dL (ref 8.9–10.3)
Chloride: 99 mmol/L — ABNORMAL LOW (ref 101–111)
Creatinine, Ser: 0.41 mg/dL — ABNORMAL LOW (ref 0.44–1.00)
GFR calc Af Amer: >60 mL/min (ref >60)
GFR calc non Af Amer: >60 mL/min (ref >60)
Glucose, Bld: 140 mg/dL — ABNORMAL HIGH (ref 65–99)
Potassium: 2.8 mmol/L — ABNORMAL LOW (ref 3.5–5.1)
Sodium: 136 mmol/L (ref 135–145)
Total Bilirubin: 0.7 mg/dL (ref 0.3–1.2)
Total Protein: 8.4 g/dL — ABNORMAL HIGH (ref 6.5–8.1)

## 2016-08-01 LAB — PROTIME-INR
INR: 0.97
Prothrombin Time: 12.9 seconds (ref 11.4–15.2)

## 2016-08-01 MED ORDER — POTASSIUM CHLORIDE CRYS ER 20 MEQ PO TBCR
40.0000 meq | EXTENDED_RELEASE_TABLET | Freq: Once | ORAL | Status: DC
  Filled 2016-08-01: qty 2

## 2016-08-01 MED ORDER — POTASSIUM CHLORIDE 20 MEQ/15ML (10%) PO SOLN
40.0000 meq | Freq: Once | ORAL | Status: AC
  Administered 2016-08-01: 40 meq via ORAL
  Filled 2016-08-01: qty 30

## 2016-08-01 MED ORDER — MAGNESIUM SULFATE 50 % IJ SOLN
1.0000 g | Freq: Once | INTRAMUSCULAR | Status: DC
  Filled 2016-08-01: qty 2

## 2016-08-01 MED ORDER — MAGNESIUM SULFATE 50 % IJ SOLN: 1.0000 g | Freq: Once | INTRAMUSCULAR | Status: DC

## 2016-08-01 MED ORDER — MAGNESIUM SULFATE IN D5W 1-5 GM/100ML-% IV SOLN
1.0000 g | Freq: Once | INTRAVENOUS | Status: AC
  Administered 2016-08-01: 1 g via INTRAVENOUS
  Filled 2016-08-01: qty 100

## 2016-08-01 MED ORDER — POTASSIUM CHLORIDE 10 MEQ/100ML IV SOLN
10.0000 meq | Freq: Once | INTRAVENOUS | Status: AC
  Administered 2016-08-01: 10 meq via INTRAVENOUS
  Filled 2016-08-01: qty 100

## 2016-08-01 MED ORDER — THIAMINE HCL 100 MG/ML IJ SOLN
100.0000 mg | Freq: Once | INTRAMUSCULAR | Status: AC
  Administered 2016-08-01: 100 mg via INTRAVENOUS
  Filled 2016-08-01: qty 2

## 2016-08-01 MED ORDER — TETANUS-DIPHTH-ACELL PERTUSSIS 5-2.5-18.5 LF-MCG/0.5 IM SUSP
0.5000 mL | Freq: Once | INTRAMUSCULAR | Status: AC
  Administered 2016-08-01: 0.5 mL via INTRAMUSCULAR
  Filled 2016-08-01: qty 0.5

## 2016-08-01 NOTE — ED Notes (Signed)
Pt ambulated independently with steady gait to nearby restroom.

## 2016-08-01 NOTE — ED Notes (Signed)
Bed: JK82 Expected date:  Expected time:  Means of arrival:  Comments: EMS 64yo F ETOH / fall/ Hematoma

## 2016-08-01 NOTE — ED Provider Notes (Signed)
[facility]-EMERGENCY DEPT Provider Note   CSN: [id number] Arrival date & time: 08/01/16  0154  By signing my name below, I, Alyssa Grove, attest that this documentation has been prepared under the direction and in the presence of Gilda Crease, MD. Electronically Signed: Alyssa Grove, ED Scribe. 08/01/16. 2:09 AM.   First MD Initiated Contact with Patient 08/01/16 0202    History   Chief Complaint Chief Complaint  Patient presents with  . Head Injury    ETOH   The history is provided by the patient and the EMS personnel. No language interpreter was used.   HPI Comments: [name] is a 65 y.o. female who presents to the Emergency Department by EMS complaining of a head injury s/p fall a few minutes PTA. Per EMS, pt was dancing at a bar when she fell and struck the back of her head. LOC is unknown. Pt is clearly intoxicated. Pt states she Pt denies taking any blood thinners. Pt repeatedly asks nurses and physician to "let me die", but states it is just her being drunk. Pt states she could never kill herself and denies any suicidal ideations.   Past Medical History:  Diagnosis Date  . Allergic rhinitis, cause unspecified   . Arthritis   . Chewing tobacco use   . Coronary artery disease, non-occlusive December 2006  . Dyslipidemia   . Essential hypertension    previously uncontrolled, now well-controlled; no evidence of renal artery stenosis  . GERD (gastroesophageal reflux disease)   . H/O echocardiogram 12/2014   60-65 % EF, mild basal hypertrophy focally at septum, otherwise normal  . Heart murmur 12/2014   mild focal hypertrophy of septum, EF 60-65%, trivial aortic regurgitation  . History of cardiac catheterization December 2006   for exertional chest pain: Nonobstructive CAD  . History of cardiovascular stress test 12/2014   perfusion study; low risk study.  LV Wall Motion: NL LV Function; NL Wall Motion; LVEF 63%.  Dr. Rennis Golden  . History of migraine headaches   .  Obesity   . Onychomycosis 05/2015  . Pneumococcal vaccine refused 05/2015    Patient Active Problem List   Diagnosis Date Noted  . Encounter for health maintenance examination in adult 10/30/2015  . Smokeless tobacco use 10/30/2015  . Screening for osteoporosis 10/30/2015  . Vaccine counseling 10/30/2015  . Screening for breast cancer 10/30/2015  . Chewing tobacco use 06/18/2015  . Heart murmur 06/18/2015  . Pneumococcal vaccine refused 06/18/2015  . Dyslipidemia 06/18/2015  . Family history of premature CAD 01/10/2015  . GERD (gastroesophageal reflux disease) 04/28/2012  . Essential hypertension - previously difficult to control 09/21/2011    Past Surgical History:  Procedure Laterality Date  . ABDOMINAL HYSTERECTOMY     PARTIAL (benign, for bleeding)  . CARDIAC CATHETERIZATION  12/06   Dr. Clarene Duke; no significant CAD. Normal LV function with EF of 60-65%. Also noted no RAS.  Marland Kitchen CAROTID ANGIOGRAM  2005  . COLONOSCOPY  2009   Dr. Loreta Ave  . ESOPHAGOGASTRODUODENOSCOPY  2009   Dr. Loreta Ave  . LAPAROSCOPY  12/02   ovarian cyst    OB History    No data available       Home Medications    Prior to Admission medications   Medication Sig Start Date End Date Taking? Authorizing Provider  amLODipine (NORVASC) 10 MG tablet Take 1 tablet (10 mg total) by mouth daily. 10/30/15  Yes Kermit Balo Tysinger, PA-C  fluconazole (DIFLUCAN) 150 MG tablet Take 1 tablet (  150 mg total) by mouth once. Patient not taking: Reported on 08/01/2016 03/18/16   Arthor Captain, PA-C  hydrochlorothiazide (HYDRODIURIL) 25 MG tablet Take 1 tablet (25 mg total) by mouth daily. Patient not taking: Reported on 08/01/2016 10/30/15   Kermit Balo Tysinger, PA-C  HYDROcodone-acetaminophen (NORCO) 5-325 MG tablet Take 2 tablets by mouth every 4 (four) hours as needed. Patient not taking: Reported on 08/01/2016 03/18/16   Arthor Captain, PA-C  metroNIDAZOLE (METROGEL) 0.75 % vaginal gel Place 1 Applicatorful vaginally 2 (two) times  daily. For 7 days Patient not taking: Reported on 08/01/2016 03/18/16   Arthor Captain, PA-C  ondansetron (ZOFRAN) 4 MG tablet Take 1 tablet (4 mg total) by mouth every 8 (eight) hours as needed for nausea or vomiting. Patient not taking: Reported on 08/01/2016 03/18/16   Arthor Captain, PA-C  terbinafine (LAMISIL) 250 MG tablet Take 1 tablet (250 mg total) by mouth daily. Patient not taking: Reported on 10/30/2015 06/18/15   Kermit Balo Tysinger, PA-C  Vitamin D, Ergocalciferol, (DRISDOL) 50000 UNITS CAPS capsule Take 1 capsule (50,000 Units total) by mouth every 7 (seven) days. Patient not taking: Reported on 08/01/2016 10/31/15   Jac Canavan, PA-C    Family History Family History  Problem Relation Age of Onset  . Heart disease Mother     CHF  . Hypertension Mother   . Heart disease Father 45    MI  . Heart disease Brother   . Heart disease Maternal Aunt   . Diabetes Maternal Aunt   . Cancer Maternal Aunt     cervical  . Heart disease Maternal Uncle   . Diabetes Maternal Uncle   . Heart disease Maternal Grandmother     CHF  . Diabetes Maternal Grandmother   . Heart disease Paternal Uncle   . Heart disease Brother   . Heart disease Brother     Social History Social History  Substance Use Topics  . Smoking status: Never Smoker  . Smokeless tobacco: Current User    Types: Snuff     Comment: hhas been using since she was a teenager  . Alcohol use 1.8 oz/week    3 Glasses of wine per week     Comment: 12 drinks per week.     Allergies   Morphine and related   Review of Systems Review of Systems  Constitutional: Negative for fever.  Musculoskeletal: Negative for back pain and neck pain.  Skin: Positive for wound.  Psychiatric/Behavioral: Negative for suicidal ideas.  All other systems reviewed and are negative.    Physical Exam Updated Vital Signs BP 114/64   Pulse 63   Temp 97.6 F (36.4 C) (Oral)   Resp 20   Ht  (1.575 m)   Wt 135 lb (61.2 kg)   SpO2 99%    BMI 24.69 kg/m   Physical Exam  Constitutional: She is oriented to person, place, and time. She appears well-developed and well-nourished. No distress.  HENT:  Head: Normocephalic.  Right Ear: Hearing normal.  Left Ear: Hearing normal.  Nose: Nose normal.  Mouth/Throat: Oropharynx is clear and moist and mucous membranes are normal.  1.5 cm laceration with a contusion  Eyes: Conjunctivae and EOM are normal. Pupils are equal, round, and reactive to light.  Neck: Normal range of motion. Neck supple.  Cardiovascular: Regular rhythm, S1 normal and S2 normal.  Exam reveals no gallop and no friction rub.   No murmur heard. Pulmonary/Chest: Effort normal and breath sounds normal. No respiratory  distress. She exhibits no tenderness.  Abdominal: Soft. Normal appearance and bowel sounds are normal. There is no hepatosplenomegaly. There is no tenderness. There is no rebound, no guarding, no tenderness at McBurney's point and negative Murphy's sign. No hernia.  Musculoskeletal: Normal range of motion.  Neurological: She is alert and oriented to person, place, and time. She has normal strength. No cranial nerve deficit or sensory deficit. Coordination normal. GCS eye subscore is 4. GCS verbal subscore is 5. GCS motor subscore is 6.  Skin: Skin is warm, dry and intact. No rash noted. No cyanosis.  Psychiatric: She has a normal mood and affect. Her speech is normal and behavior is normal. Thought content normal.  Nursing note and vitals reviewed.    ED Treatments / Results  DIAGNOSTIC STUDIES: Oxygen Saturation is 98% on RA, normal by my interpretation.    COORDINATION OF CARE: 2:08 AM Discussed treatment plan with pt at bedside which includes CT Head WO Contrast, CT Cervical Spine WO Contrast and lab work and pt agreed to plan.  Labs (all labs ordered are listed, but only abnormal results are displayed) Labs Reviewed  COMPREHENSIVE METABOLIC PANEL - Abnormal; Notable for the following:        Result Value   Potassium 2.8 (*)    Chloride 99 (*)    Glucose, Bld 140 (*)    Creatinine, Ser 0.41 (*)    Total Protein 8.4 (*)    All other components within normal limits  CBC WITH DIFFERENTIAL/PLATELET  PROTIME-INR  URINE RAPID DRUG SCREEN, HOSP PERFORMED    EKG  EKG Interpretation None       Radiology Ct Head Wo Contrast  Result Date: 08/01/2016 CLINICAL DATA:  Fall wall dancing on a bar striking back of head. Unknown loss of consciousness. EXAM: CT HEAD WITHOUT CONTRAST CT CERVICAL SPINE WITHOUT CONTRAST TECHNIQUE: Multidetector CT imaging of the head and cervical spine was performed following the standard protocol without intravenous contrast. Multiplanar CT image reconstructions of the cervical spine were also generated. COMPARISON:  Head CT 07/12/2010 FINDINGS: CT HEAD FINDINGS Brain: No intracranial hemorrhage, mass effect, or midline shift. No hydrocephalus. The basilar cisterns are patent. No evidence of territorial infarct. No intracranial fluid collection. Vascular: No hyperdense vessel or abnormal calcification. Skull: Right parietal subgaleal scalp hematoma and laceration without fracture. Calvarium is intact. Sinuses/Orbits: Mucosal thickening in right side of sphenoid sinus and scattered throughout the ethmoid air cells. The mastoid air cells are well aerated. CT CERVICAL SPINE FINDINGS No fracture or acute subluxation. The dens is intact. There are no jumped or perched facets. Mild straightening of normal lordosis. Disc space narrowing at C5-C6 and C6-C7 with endplate spurring. Scattered facet arthropathy most prominent in the lower cervical spine. No prevertebral soft tissue edema. IMPRESSION: 1. Right parietal subgaleal scalp hematoma without calvarial fracture or acute intracranial abnormality. 2. No fracture or subluxation of the cervical spine. Electronically Signed   By: Rubye Oaks M.D.   On: 08/01/2016 03:27   Ct Cervical Spine Wo Contrast  Result Date:  08/01/2016 CLINICAL DATA:  Fall wall dancing on a bar striking back of head. Unknown loss of consciousness. EXAM: CT HEAD WITHOUT CONTRAST CT CERVICAL SPINE WITHOUT CONTRAST TECHNIQUE: Multidetector CT imaging of the head and cervical spine was performed following the standard protocol without intravenous contrast. Multiplanar CT image reconstructions of the cervical spine were also generated. COMPARISON:  Head CT 07/12/2010 FINDINGS: CT HEAD FINDINGS Brain: No intracranial hemorrhage, mass effect, or midline shift. No hydrocephalus.  The basilar cisterns are patent. No evidence of territorial infarct. No intracranial fluid collection. Vascular: No hyperdense vessel or abnormal calcification. Skull: Right parietal subgaleal scalp hematoma and laceration without fracture. Calvarium is intact. Sinuses/Orbits: Mucosal thickening in right side of sphenoid sinus and scattered throughout the ethmoid air cells. The mastoid air cells are well aerated. CT CERVICAL SPINE FINDINGS No fracture or acute subluxation. The dens is intact. There are no jumped or perched facets. Mild straightening of normal lordosis. Disc space narrowing at C5-C6 and C6-C7 with endplate spurring. Scattered facet arthropathy most prominent in the lower cervical spine. No prevertebral soft tissue edema. IMPRESSION: 1. Right parietal subgaleal scalp hematoma without calvarial fracture or acute intracranial abnormality. 2. No fracture or subluxation of the cervical spine. Electronically Signed   By: Rubye Oaks M.D.   On: 08/01/2016 03:27    Procedures Procedures (including critical care time)  Medications Ordered in ED Medications  potassium chloride 10 mEq in 100 mL IVPB (not administered)  potassium chloride SA (K-DUR,KLOR-CON) CR tablet 40 mEq (40 mEq Oral Refused 08/01/16 0340)  magnesium sulfate IVPB 1 g 100 mL (1 g Intravenous New Bag/Given 08/01/16 0335)  thiamine (B-1) injection 100 mg (100 mg Intravenous Given 08/01/16 0258)  potassium  chloride 20 MEQ/15ML (10%) solution 40 mEq (40 mEq Oral Given 08/01/16 0401)     Initial Impression / Assessment and Plan / ED Course  I have reviewed the triage vital signs and the nursing notes.  Pertinent labs & imaging results that were available during my care of the patient were reviewed by me and considered in my medical decision making (see chart for details).  Clinical Course   Patient presented to the emergency department after a fall. Patient was intoxicated at arrival. She did have a laceration to the back of her head that was more of a macerated contused area that did not require repair. Pressure dressing was applied. Patient underwent CT head and cervical spine which were negative. Laboratory reveals hypokalemia. Patient had magnesium and potassium administered. Patient will be monitored here in the ER until she is more awake and sober. At that point anticipate discharge.  Final Clinical Impressions(s) / ED Diagnoses   Final diagnoses:  Minor head injury, initial encounter  Hyponatremia  Alcohol intoxication, uncomplicated (HCC)    New Prescriptions New Prescriptions   No medications on file   I personally performed the services described in this documentation, which was scribed in my presence. The recorded information has been reviewed and is accurate.     Gilda Crease, MD 08/01/16 [phone]

## 2016-08-01 NOTE — ED Notes (Signed)
At discharge cane noted at bedside pt states "that is not mine." Pt refused to take black cane home. Cane placed by registration lost and found.

## 2016-08-01 NOTE — ED Notes (Signed)
Pt assisted to the restroom by Dannielle, NT to provide urine specimen.  Pt came out of bathroom w/ what appeared to be water from the toilet.  When asked about specimen pt stated, "Oh I may have put toilet water in there, I don't know."  Will attempt to collect urine specimen at a later time. Will inform Dr. Blinda Leatherwood.

## 2016-08-01 NOTE — ED Notes (Signed)
Patient transported to CT 

## 2016-08-01 NOTE — ED Triage Notes (Signed)
Per EMS pt was dancing at a bar when she fell and struck the back of her head.  Pt has ETOH on board.  Denies neck/back pain.  Unknown LOC.  Pt is tearful at this time.

## 2016-08-03 ENCOUNTER — Telehealth: Payer: Self-pay | Admitting: Medical

## 2016-08-03 NOTE — Telephone Encounter (Signed)
Pt is coming in Thursday at 12.30

## 2016-08-03 NOTE — Telephone Encounter (Signed)
Have them come in for emergency department follow up.  They were seen recently in the emergency dept.  Of note, as of Monday morning, they may still be a the hospital, so double check before you call

## 2016-08-06 ENCOUNTER — Other Ambulatory Visit: Payer: Self-pay | Admitting: Medical

## 2016-08-06 ENCOUNTER — Ambulatory Visit (INDEPENDENT_AMBULATORY_CARE_PROVIDER_SITE_OTHER): Payer: BC Managed Care – PPO | Admitting: Medical

## 2016-08-06 VITALS — BP 124/70 | HR 67 | Resp 16 | Wt 136.8 lb

## 2016-08-06 DIAGNOSIS — F430 Acute stress reaction: Secondary | ICD-10-CM | POA: Diagnosis not present

## 2016-08-06 DIAGNOSIS — S0003XD Contusion of scalp, subsequent encounter: Secondary | ICD-10-CM | POA: Diagnosis not present

## 2016-08-06 DIAGNOSIS — W19XXXD Unspecified fall, subsequent encounter: Secondary | ICD-10-CM | POA: Diagnosis not present

## 2016-08-06 DIAGNOSIS — F1010 Alcohol abuse, uncomplicated: Secondary | ICD-10-CM | POA: Diagnosis not present

## 2016-08-06 NOTE — Progress Notes (Signed)
Subjective: Chief Complaint  Patient presents with  . Follow-up    hospital f/u- head injury from fall. pt states had CT scan of head.    here for hospital f/u.   Went to the ED 08/01/16 for head energy, EMS was called to a bar where she was reportedly dancing.   She notes that she came out of the bathroom and slipped in her flip flops and fell. Regardless, she was intoxicated and was taken to hospital due to hitting head and bleeding.   She was observed for hours until she sobered up.     Today she notes there is still a knot on her right parietal scalp, but it has improved some with ice and time.  She notes that she normally doesn't go to bars.  She drinks on average 1-2 drinks per night at home but has done this for long period of time. However last week one of her daughters who has a host of medical problems was told by her doctor that she has 5 years to live. This really upset her and she ended up drinking to much with friends at a bar.  She denies being an alcoholic, and no prior psychiatry visit for alcohol  abuse or mental health issues.    Currently denies headache other than contusion, no confusion, no paresthesia, no vision changes.    No other aggravating or relieving factors. No other complaint.   Past Medical History:  Diagnosis Date  . Allergic rhinitis, cause unspecified   . Arthritis   . Chewing tobacco use   . Coronary artery disease, non-occlusive December 2006  . Dyslipidemia   . Essential hypertension    previously uncontrolled, now well-controlled; no evidence of renal artery stenosis  . GERD (gastroesophageal reflux disease)   . H/O echocardiogram 12/2014   60-65 % EF, mild basal hypertrophy focally at septum, otherwise normal  . Heart murmur 12/2014   mild focal hypertrophy of septum, EF 60-65%, trivial aortic regurgitation  . History of cardiac catheterization December 2006   for exertional chest pain: Nonobstructive CAD  . History of cardiovascular stress test  12/2014   perfusion study; low risk study.  LV Wall Motion: NL LV Function; NL Wall Motion; LVEF 63%.  Dr. Rennis GoldenHilty  . History of migraine headaches   . Obesity   . Onychomycosis 05/2015  . Pneumococcal vaccine refused 05/2015   ROS as in subjective   Objective: BP 124/70   Pulse 67   Resp 16   Wt 136 lb 12.8 oz (62.1 kg)   SpO2 96%   BMI 25.02 kg/m   Gen: wd,wn, nad Right superior parietal scalp with large 4-5cm diameter hematoma, tender, with crusted healing wound  Head otherwise nontender, normocephalic otherwise Neuro: CN2-12 intact, non focal exam Psych: pleasant, normal affect, crying at times     Assessment: Encounter Diagnoses  Name Primary?  . Hematoma of scalp, subsequent encounter Yes  . Alcohol abuse   . Acute stress reaction   . Fall, subsequent encounter      Plan: Hematoma - c/t ice pack 20min at a time.  Should gradually resolve over the next 1.5 weeks Advised she avoid alcohol intoxication, limit alcohol to 1 or less drinks daily.  Consider AA meeting or counseling  Acute stress reaction - discussed her daughter's new unfortunate health diagnoses.   Discussed coping skills, strongly advised counseling Fall - no other injury other than scalp contusion F/u prn.

## 2016-08-09 ENCOUNTER — Encounter: Payer: Self-pay | Admitting: Medical

## 2016-09-14 ENCOUNTER — Ambulatory Visit (INDEPENDENT_AMBULATORY_CARE_PROVIDER_SITE_OTHER): Payer: BC Managed Care – PPO | Admitting: Medical

## 2016-09-14 VITALS — BP 122/80 | HR 70 | Wt 136.0 lb

## 2016-09-14 DIAGNOSIS — W19XXXD Unspecified fall, subsequent encounter: Secondary | ICD-10-CM | POA: Diagnosis not present

## 2016-09-14 DIAGNOSIS — S0003XD Contusion of scalp, subsequent encounter: Secondary | ICD-10-CM | POA: Diagnosis not present

## 2016-09-14 DIAGNOSIS — E876 Hypokalemia: Secondary | ICD-10-CM | POA: Insufficient documentation

## 2016-09-14 DIAGNOSIS — F430 Acute stress reaction: Secondary | ICD-10-CM

## 2016-09-14 NOTE — Progress Notes (Signed)
Subjective: Chief Complaint  Patient presents with  . follow-up    5 week follow-up. declines flu shot   Here for f/u on scalp hematoma, low potassium, discuss last visit, and interest in pursing more intense exercise program  From last visit I had seen her s/p emergency dept visit on 08/01/16 for head injury.   Since last visit the hematoma area has reduced in swelling, not painful now, but there is a scab.  She wants to get a perm soon.  She denies heavy alcohol use since last visit and is upset that the hospital records note alcohol abuse or intoxication.  She doesn't want her children to find out about this.   Currently denies headache, no confusion, no paresthesia, no vision changes.    No other aggravating or relieving factors. \  No other complaint.   Past Medical History:  Diagnosis Date  . Allergic rhinitis, cause unspecified   . Arthritis   . Chewing tobacco use   . Coronary artery disease, non-occlusive December 2006  . Dyslipidemia   . Essential hypertension    previously uncontrolled, now well-controlled; no evidence of renal artery stenosis  . GERD (gastroesophageal reflux disease)   . H/O echocardiogram 12/2014   60-65 % EF, mild basal hypertrophy focally at septum, otherwise normal  . Heart murmur 12/2014   mild focal hypertrophy of septum, EF 60-65%, trivial aortic regurgitation  . History of cardiac catheterization December 2006   for exertional chest pain: Nonobstructive CAD  . History of cardiovascular stress test 12/2014   perfusion study; low risk study.  LV Wall Motion: NL LV Function; NL Wall Motion; LVEF 63%.  Dr. Rennis GoldenHilty  . History of migraine headaches   . Obesity   . Onychomycosis 05/2015  . Pneumococcal vaccine refused 05/2015   Current Outpatient Prescriptions on File Prior to Visit  Medication Sig Dispense Refill  . amLODipine (NORVASC) 10 MG tablet Take 1 tablet (10 mg total) by mouth daily. 90 tablet 3  . hydrochlorothiazide (HYDRODIURIL) 25 MG tablet  Take 1 tablet (25 mg total) by mouth daily. 90 tablet 3   No current facility-administered medications on file prior to visit.     ROS as in subjective   Objective: BP 122/80   Pulse 70   Wt 136 lb (61.7 kg)   BMI 24.87 kg/m   Gen: wd,wn, nad Right superior parietal scalp with 1cm round crusting scab, surrounding pink tissue healing appropriate from prior hematoma Scalp otherwise nontender. Neuro: CN2-12 intact, non focal exam Psych: pleasant, normal affect   Assessment: Encounter Diagnoses  Name Primary?  . Acute stress reaction Yes  . Hypokalemia   . Fall, subsequent encounter   . Hematoma of scalp, subsequent encounter      Plan: Acute stress - doing better.  Not using alcohol like she wsa.  discussed her concerns.   Reassured that her health information is HIPPA protected. Hypokalemia - labs today Fall - avoid falls, discussed prevention hematoma - healing.  Can use Vitamin E cream or shea butter to help with scab resolution Reviewed 2016 cardiac studies.  She can increase the intensity of her exercise program  Barbara LundJanet was seen today for follow-up.  Diagnoses and all orders for this visit:  Acute stress reaction  Hypokalemia -     Basic metabolic panel  Fall, subsequent encounter  Hematoma of scalp, subsequent encounter

## 2016-09-15 LAB — BASIC METABOLIC PANEL
BUN: 16 mg/dL (ref 7–25)
CO2: 29 mmol/L (ref 20–31)
Calcium: 9.3 mg/dL (ref 8.6–10.4)
Chloride: 102 mmol/L (ref 98–110)
Creat: 0.47 mg/dL — ABNORMAL LOW (ref 0.50–0.99)
Glucose, Bld: 100 mg/dL — ABNORMAL HIGH (ref 65–99)
Potassium: 4.2 mmol/L (ref 3.5–5.3)
Sodium: 139 mmol/L (ref 135–146)

## 2016-11-25 ENCOUNTER — Other Ambulatory Visit: Payer: Self-pay | Admitting: Medical

## 2017-01-19 ENCOUNTER — Ambulatory Visit (INDEPENDENT_AMBULATORY_CARE_PROVIDER_SITE_OTHER): Payer: BC Managed Care – PPO | Admitting: Family Medicine

## 2017-01-19 ENCOUNTER — Encounter: Payer: Self-pay | Admitting: Family Medicine

## 2017-01-19 VITALS — BP 120/70 | HR 70 | Wt 135.0 lb

## 2017-01-19 DIAGNOSIS — M25561 Pain in right knee: Secondary | ICD-10-CM | POA: Diagnosis not present

## 2017-01-19 DIAGNOSIS — Z23 Encounter for immunization: Secondary | ICD-10-CM

## 2017-01-19 NOTE — Progress Notes (Signed)
   Subjective:    Patient ID: [name], female    DOB: 1951-02-22, 66 y.o.   MRN: [id number]  HPI She stepped in a hole in her yard 10 days ago sustaining a varus type injury to the knee with medial joint pain and she noted some swelling in that area. She is noticing more pain with physical activity and also even when laying down at night.   Review of Systems     Objective:   Physical Exam Right knee exam shows a minimal effusion. Slight tenderness palpation over the medial joint line. McMurray's testing did cause some discomfort. Negative anterior drawer. Medial and lateral collateral ligaments intact.       Assessment & Plan:  Acute pain of right knee  Need for prophylactic vaccination and inoculation against influenza - Plan: Flu vaccine HIGH DOSE PF (Fluzone High dose)  Need for prophylactic vaccination against Streptococcus pneumoniae (pneumococcus) - Plan: Pneumococcal conjugate vaccine 13-valent And conservative care for this with anti-inflammatory twice regularly for the next 14 days. I will also update her immunizations. She is to set up for complete examination within the next several months. If her pain does not improve, she will return here for further evaluation.

## 2017-01-19 NOTE — Patient Instructions (Signed)
Take 4 ibuprofen 3 times per day for the next couple of weeks and if he still having trouble make another appointment

## 2017-02-21 ENCOUNTER — Other Ambulatory Visit: Payer: Self-pay | Admitting: Medical

## 2017-05-21 ENCOUNTER — Other Ambulatory Visit: Payer: Self-pay | Admitting: Medical

## 2017-07-26 ENCOUNTER — Encounter: Payer: BC Managed Care – PPO | Admitting: Medical

## 2017-08-05 ENCOUNTER — Ambulatory Visit (INDEPENDENT_AMBULATORY_CARE_PROVIDER_SITE_OTHER): Payer: BC Managed Care – PPO | Admitting: Medical

## 2017-08-05 ENCOUNTER — Encounter: Payer: Self-pay | Admitting: Medical

## 2017-08-05 ENCOUNTER — Other Ambulatory Visit: Payer: Self-pay | Admitting: Medical

## 2017-08-05 VITALS — BP 144/70 | HR 72 | Wt 140.4 lb

## 2017-08-05 DIAGNOSIS — E785 Hyperlipidemia, unspecified: Secondary | ICD-10-CM

## 2017-08-05 DIAGNOSIS — Z0000 Encounter for general adult medical examination without abnormal findings: Secondary | ICD-10-CM | POA: Diagnosis not present

## 2017-08-05 DIAGNOSIS — Z9181 History of falling: Secondary | ICD-10-CM | POA: Diagnosis not present

## 2017-08-05 DIAGNOSIS — I10 Essential (primary) hypertension: Secondary | ICD-10-CM

## 2017-08-05 DIAGNOSIS — K219 Gastro-esophageal reflux disease without esophagitis: Secondary | ICD-10-CM | POA: Diagnosis not present

## 2017-08-05 DIAGNOSIS — Z7189 Other specified counseling: Secondary | ICD-10-CM | POA: Diagnosis not present

## 2017-08-05 DIAGNOSIS — Z8249 Family history of ischemic heart disease and other diseases of the circulatory system: Secondary | ICD-10-CM

## 2017-08-05 DIAGNOSIS — Z720 Tobacco use: Secondary | ICD-10-CM | POA: Diagnosis not present

## 2017-08-05 DIAGNOSIS — Z1231 Encounter for screening mammogram for malignant neoplasm of breast: Secondary | ICD-10-CM | POA: Diagnosis not present

## 2017-08-05 DIAGNOSIS — Z780 Asymptomatic menopausal state: Secondary | ICD-10-CM | POA: Diagnosis not present

## 2017-08-05 DIAGNOSIS — Z1239 Encounter for other screening for malignant neoplasm of breast: Secondary | ICD-10-CM

## 2017-08-05 DIAGNOSIS — I2510 Atherosclerotic heart disease of native coronary artery without angina pectoris: Secondary | ICD-10-CM | POA: Insufficient documentation

## 2017-08-05 DIAGNOSIS — Z1211 Encounter for screening for malignant neoplasm of colon: Secondary | ICD-10-CM

## 2017-08-05 DIAGNOSIS — E876 Hypokalemia: Secondary | ICD-10-CM

## 2017-08-05 DIAGNOSIS — Z7185 Encounter for immunization safety counseling: Secondary | ICD-10-CM

## 2017-08-05 LAB — LIPID PANEL
Cholesterol: 233 mg/dL — ABNORMAL HIGH (ref <200)
HDL: 115 mg/dL (ref >50)
LDL Cholesterol: 109 mg/dL — ABNORMAL HIGH (ref <100)
Total CHOL/HDL Ratio: 2 Ratio (ref <5.0)
Triglycerides: 43 mg/dL (ref <150)
VLDL: 9 mg/dL (ref <30)

## 2017-08-05 LAB — CBC
HCT: 39.6 % (ref 35.0–45.0)
Hemoglobin: 13.6 g/dL (ref 11.7–15.5)
MCH: 33.6 pg — ABNORMAL HIGH (ref 27.0–33.0)
MCHC: 34.3 g/dL (ref 32.0–36.0)
MCV: 97.8 fL (ref 80.0–100.0)
MPV: 8.9 fL (ref 7.5–12.5)
Platelets: 182 10*3/uL (ref 140–400)
RBC: 4.05 MIL/uL (ref 3.80–5.10)
RDW: 13 % (ref 11.0–15.0)
WBC: 6.4 10*3/uL (ref 4.0–10.5)

## 2017-08-05 LAB — COMPREHENSIVE METABOLIC PANEL
ALT: 16 U/L (ref 6–29)
AST: 23 U/L (ref 10–35)
Albumin: 4.3 g/dL (ref 3.6–5.1)
Alkaline Phosphatase: 71 U/L (ref 33–130)
BUN: 9 mg/dL (ref 7–25)
CO2: 25 mmol/L (ref 20–32)
Calcium: 9 mg/dL (ref 8.6–10.4)
Chloride: 103 mmol/L (ref 98–110)
Creat: 0.44 mg/dL — ABNORMAL LOW (ref 0.50–0.99)
Glucose, Bld: 81 mg/dL (ref 65–99)
Potassium: 3.7 mmol/L (ref 3.5–5.3)
Sodium: 141 mmol/L (ref 135–146)
Total Bilirubin: 0.9 mg/dL (ref 0.2–1.2)
Total Protein: 7.7 g/dL (ref 6.1–8.1)

## 2017-08-05 LAB — TSH: TSH: 0.37 mIU/L — ABNORMAL LOW

## 2017-08-05 MED ORDER — AMLODIPINE BESYLATE 10 MG PO TABS: ORAL_TABLET | ORAL | 3 refills | Status: DC

## 2017-08-05 MED ORDER — HYDROCHLOROTHIAZIDE 25 MG PO TABS: 25.0000 mg | ORAL_TABLET | Freq: Every day | ORAL | 3 refills | Status: DC

## 2017-08-05 NOTE — Progress Notes (Signed)
Subjective: Chief Complaint  Patient presents with  . med check    med check   Concerns: Cold feeling all the time.   Skin bruises easy  Has 3 kids, doesn't speak to her daughter Babette Relic, son lives in [place], and she does see Lurena Joiner.   Has 1 grandson living in her house Has 10 grandchildren, 2 great grandchildren  Still using chewing   Diet - is good, walking some.  Reviewed their medical, surgical, family, social, medication, and allergy history and updated chart as appropriate.  Past Medical History:  Diagnosis Date  . Allergic rhinitis, cause unspecified   . Arthritis   . Chewing tobacco use   . Coronary artery disease, non-occlusive December 2006  . Dyslipidemia   . Essential hypertension    previously uncontrolled, now well-controlled; no evidence of renal artery stenosis  . GERD (gastroesophageal reflux disease)   . H/O echocardiogram 12/2014   60-65 % EF, mild basal hypertrophy focally at septum, otherwise normal  . Heart murmur 12/2014   mild focal hypertrophy of septum, EF 60-65%, trivial aortic regurgitation  . History of cardiac catheterization December 2006   for exertional chest pain: Nonobstructive CAD  . History of cardiovascular stress test 12/2014   perfusion study; low risk study.  LV Wall Motion: NL LV Function; NL Wall Motion; LVEF 63%.  Dr. Rennis Golden  . History of migraine headaches    not an issues as of 07/2017  . Onychomycosis 05/2015  . Pneumococcal vaccine refused 05/2015    Past Surgical History:  Procedure Laterality Date  . ABDOMINAL HYSTERECTOMY     PARTIAL (benign, for bleeding), still has ovaries  . CARDIAC CATHETERIZATION  12/06   Dr. Clarene Duke; no significant CAD. Normal LV function with EF of 60-65%. Also noted no RAS.  Marland Kitchen CAROTID ANGIOGRAM  2005  . COLONOSCOPY  2009   Dr. Loreta Ave  . ESOPHAGOGASTRODUODENOSCOPY  2009   Dr. Loreta Ave  . EYE SURGERY     new lenses 07/2017  . LAPAROSCOPY  12/02   ovarian cyst    Social History   Social History   . Marital status: Married    Spouse name: N/A  . Number of children: 3  . Years of education: N/A   Occupational History  . 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 Smoking status: Never Smoker  . Smokeless tobacco: Current User    Types: Snuff     Comment: hhas been using since she was a teenager  . Alcohol use 8.4 oz/week    14 Cans of beer per week     Comment: 12 drinks per week.  . Drug use: No  . Sexual activity: No   Other Topics Concern  . Not on file   Social History Narrative   Widowed 2016.  Has new boyfriend as of 10/2015, just started riding motorcycle, trying to get new enjoyment in life since being widowed.        Was married 43 years with Deeann Cree.       Has been dipping snuff since she was a teenager.      She drinks 2-3 Sherilyn Dacosta Beers most days out of the week      She "does not do as much exercise as she should, walking maybe 1 day week.    Family History  Problem Relation Age of Onset  . Heart disease Mother        CHF  . Hypertension Mother   .  Heart disease Father [id number]       MI  . Heart disease Brother   . Heart disease Maternal Aunt   . Diabetes Maternal Aunt   . Cancer Maternal Aunt        cervical  . Heart disease Maternal Uncle   . Diabetes Maternal Uncle   . Heart disease Maternal Grandmother        CHF  . Diabetes Maternal Grandmother   . Heart disease Paternal Uncle   . Heart disease Brother   . Heart disease Brother      Current Outpatient Prescriptions:  .  amLODipine (NORVASC) 10 MG tablet, TAKE 1 TABLET(10 MG) BY MOUTH DAILY, Disp: 90 tablet, Rfl: 3 .  hydrochlorothiazide (HYDRODIURIL) 25 MG tablet, Take 1 tablet (25 mg total) by mouth daily., Disp: 90 tablet, Rfl: 3  Allergies  Allergen Reactions  . Morphine And Related Other (See Comments)    Palpitations and shaking.    Review of Systems Constitutional: -fever, -chills, -sweats, -unexpected weight change, -decreased appetite,  +fatigue Allergy: -sneezing, -itching, -congestion Dermatology: -changing moles, --rash, -lumps ENT: -runny nose, -ear pain, -sore throat, -hoarseness, -sinus pain, -teeth pain, - ringing in ears, -hearing loss, -nosebleeds Cardiology: -chest pain, -palpitations, -swelling, -difficulty breathing when lying flat, -waking up short of breath Respiratory: -cough, -shortness of breath, -difficulty breathing with exercise or exertion, -wheezing, -coughing up blood Gastroenterology: -abdominal pain, -nausea, -vomiting, -diarrhea, -constipation, -blood in stool, -changes in bowel movement, -difficulty swallowing or eating Hematology: -bleeding, -bruising  Musculoskeletal: -joint aches, -muscle aches, -joint swelling, -back pain, -neck pain, -cramping, -changes in gait Ophthalmology: denies vision changes, eye redness, itching, discharge Urology: -burning with urination, -difficulty urinating, -blood in urine, -urinary frequency, -urgency, -incontinence Neurology: -headache, -weakness, -tingling, -numbness, -memory loss, -falls, -dizziness Psychology: -depressed mood, -agitation, -sleep problems     Objective:   Physical Exam BP (!) 144/70   Pulse 72   Wt 140 lb 6.4 oz (63.7 kg)   SpO2 98%   BMI 25.68 kg/m   General appearance: alert, no distress, WD/WN, white female Skin: scattered macules, no worrisome lesions HEENT: normocephalic, conjunctiva/corneas normal, sclerae anicteric, PERRLA, EOMi, nares patent, no discharge or erythema, pharynx normal Oral cavity: MMM, tongue normal, teeth normal Neck: supple, no lymphadenopathy, no thyromegaly, no masses, normal ROM Chest: non tender, normal shape and expansion Heart: RRR, normal S1, S2, no murmurs Lungs: CTA bilaterally, no wheezes, rhonchi, or rales Abdomen: +bs, soft, non tender, non distended, no masses, no hepatomegaly, no splenomegaly, no bruits Back: non tender, normal ROM, no scoliosis Musculoskeletal: upper extremities non tender, no  obvious deformity, normal ROM throughout, lower extremities non tender, no obvious deformity, normal ROM throughout Extremities: no edema, no cyanosis, no clubbing Pulses: 2+ symmetric, upper and lower extremities, normal cap refill Neurological: alert, oriented x 3, CN2-12 intact, strength normal upper extremities and lower extremities, sensation normal throughout, DTRs 2+ throughout, no cerebellar signs, gait normal Psychiatric: normal affect, behavior normal, pleasant  Breast/gyn/rectal - deferred to gyn   Assessment and Plan :    Encounter Diagnoses  Name Primary?  . Encounter for health maintenance examination in adult Yes  . Essential hypertension - previously difficult to control   . Gastroesophageal reflux disease without esophagitis   . Chewing tobacco use   . Dyslipidemia   . Vaccine counseling   . Smokeless tobacco use   . Screening for breast cancer   . Hypokalemia   . Family history of premature CAD   . Screen for colon  cancer   . Post-menopausal   . History of fall   . Coronary artery disease involving native heart without angina pectoris, unspecified vessel or lesion type     Physical exam - discussed healthy lifestyle, diet, exercise, preventative care, vaccinations, and addressed their concerns.  Handout given. Labs today Advised yearly flu shot Pending labs, will likely recommend statin.   Patient Instructions  Recommendations:  I recommend you have a shingles vaccine to help prevent shingles or herpes zoster outbreak.   Please call your insurer to inquire about coverage for the Shingrix vaccine given in 2 doses.   Some insurers cover this vaccine after age 86, some cover this after age 52.  If your insurer covers this, then call to schedule appointment to have this vaccine here.  I recommend you call to see if insurance will cover Pneumococcal 23 vaccine.   You had the Prevnar 13/pneumococcal this past year, so that one is up to date  Call insurance about  the Cologard colon cancer test.  If this is covered, lets do it now.  Typically this screen is done every 3 years  Get a flu shot yearly See your eye doctor yearly for routine vision care.  See your dentist yearly for routine dental care including hygiene visits twice yearly.  Schedule a mammogram  schedule a bone density scan  Return for pelvic exam    [name] was seen today for med check.  Diagnoses and all orders for this visit:  Encounter for health maintenance examination in adult -     Comprehensive metabolic panel -     CBC -     Lipid panel -     TSH -     Hemoglobin A1c -     Vitamin B12 -     Folate -     DG Bone Density; Future -     MM DIGITAL SCREENING BILATERAL; Future  Essential hypertension - previously difficult to control  Gastroesophageal reflux disease without esophagitis  Chewing tobacco use  Dyslipidemia -     Lipid panel  Vaccine counseling  Smokeless tobacco use  Screening for breast cancer -     MM DIGITAL SCREENING BILATERAL; Future  Hypokalemia  Family history of premature CAD  Screen for colon cancer  Post-menopausal -     DG Bone Density; Future  History of fall -     DG Bone Density; Future  Coronary artery disease involving native heart without angina pectoris, unspecified vessel or lesion type  Other orders -     amLODipine (NORVASC) 10 MG tablet; TAKE 1 TABLET(10 MG) BY MOUTH DAILY -     hydrochlorothiazide (HYDRODIURIL) 25 MG tablet; Take 1 tablet (25 mg total) by mouth daily.  Follow-up pending labs

## 2017-08-05 NOTE — Patient Instructions (Addendum)
Recommendations:  I recommend you have a shingles vaccine to help prevent shingles or herpes zoster outbreak.   Please call your insurer to inquire about coverage for the Shingrix vaccine given in 2 doses.   Some insurers cover this vaccine after age 67, some cover this after age 67.  If your insurer covers this, then call to schedule appointment to have this vaccine here.  I recommend you call to see if insurance will cover Pneumococcal 23 vaccine.   You had the Prevnar 13/pneumococcal this past year, so that one is up to date  Call insurance about the Cologard colon cancer test.  If this is covered, lets do it now.  Typically this screen is done every 3 years  Get a flu shot yearly See your eye doctor yearly for routine vision care.  See your dentist yearly for routine dental care including hygiene visits twice yearly.  Schedule a mammogram  schedule a bone density scan  Return for pelvic exam

## 2017-08-06 LAB — HEMOGLOBIN A1C
Hgb A1c MFr Bld: 4.9 %
Mean Plasma Glucose: 94 mg/dL

## 2017-08-06 LAB — VITAMIN B12: Vitamin B-12: 491 pg/mL (ref 200–1100)

## 2017-08-06 LAB — FOLATE: Folate: 22.5 ng/mL (ref >5.4)

## 2017-08-07 LAB — T4, FREE: Free T4: 1.2 ng/dL (ref 0.8–1.8)

## 2017-08-08 ENCOUNTER — Other Ambulatory Visit: Payer: Self-pay | Admitting: Medical

## 2017-08-08 DIAGNOSIS — R7989 Other specified abnormal findings of blood chemistry: Secondary | ICD-10-CM

## 2017-09-06 ENCOUNTER — Other Ambulatory Visit: Payer: BC Managed Care – PPO

## 2017-09-06 DIAGNOSIS — R7989 Other specified abnormal findings of blood chemistry: Secondary | ICD-10-CM

## 2017-09-07 LAB — TSH: TSH: 0.79 mIU/L (ref 0.40–4.50)

## 2017-09-07 LAB — T4, FREE: Free T4: 1 ng/dL (ref 0.8–1.8)

## 2018-08-10 ENCOUNTER — Encounter: Payer: Self-pay | Admitting: Medical

## 2018-09-19 ENCOUNTER — Ambulatory Visit
Admission: RE | Admit: 2018-09-19 | Discharge: 2018-09-19 | Disposition: A | Discharge status: Home / Self Care | Payer: BC Managed Care – PPO | Source: Ambulatory Visit | Attending: Medical | Admitting: Medical

## 2018-09-19 DIAGNOSIS — Z0000 Encounter for general adult medical examination without abnormal findings: Secondary | ICD-10-CM

## 2018-09-19 DIAGNOSIS — Z1239 Encounter for other screening for malignant neoplasm of breast: Secondary | ICD-10-CM

## 2018-09-20 LAB — HM MAMMOGRAPHY

## 2018-09-21 ENCOUNTER — Ambulatory Visit (INDEPENDENT_AMBULATORY_CARE_PROVIDER_SITE_OTHER): Payer: Medicare Other | Admitting: Medical

## 2018-09-21 ENCOUNTER — Encounter: Payer: Self-pay | Admitting: Medical

## 2018-09-21 VITALS — BP 140/80 | HR 68 | Temp 98.1°F | Ht 63.0 in | Wt 150.2 lb

## 2018-09-21 DIAGNOSIS — E785 Hyperlipidemia, unspecified: Secondary | ICD-10-CM

## 2018-09-21 DIAGNOSIS — R7989 Other specified abnormal findings of blood chemistry: Secondary | ICD-10-CM

## 2018-09-21 DIAGNOSIS — F1010 Alcohol abuse, uncomplicated: Secondary | ICD-10-CM

## 2018-09-21 DIAGNOSIS — R011 Cardiac murmur, unspecified: Secondary | ICD-10-CM

## 2018-09-21 DIAGNOSIS — Z0000 Encounter for general adult medical examination without abnormal findings: Secondary | ICD-10-CM

## 2018-09-21 DIAGNOSIS — R4586 Emotional lability: Secondary | ICD-10-CM

## 2018-09-21 DIAGNOSIS — K219 Gastro-esophageal reflux disease without esophagitis: Secondary | ICD-10-CM

## 2018-09-21 DIAGNOSIS — I10 Essential (primary) hypertension: Secondary | ICD-10-CM | POA: Diagnosis not present

## 2018-09-21 DIAGNOSIS — Z7185 Encounter for immunization safety counseling: Secondary | ICD-10-CM

## 2018-09-21 DIAGNOSIS — Z720 Tobacco use: Secondary | ICD-10-CM | POA: Diagnosis not present

## 2018-09-21 DIAGNOSIS — Z7189 Other specified counseling: Secondary | ICD-10-CM

## 2018-09-21 DIAGNOSIS — Z91199 Patient's noncompliance with other medical treatment and regimen due to unspecified reason: Secondary | ICD-10-CM

## 2018-09-21 DIAGNOSIS — G4700 Insomnia, unspecified: Secondary | ICD-10-CM

## 2018-09-21 DIAGNOSIS — I2510 Atherosclerotic heart disease of native coronary artery without angina pectoris: Secondary | ICD-10-CM | POA: Diagnosis not present

## 2018-09-21 DIAGNOSIS — Z9119 Patient's noncompliance with other medical treatment and regimen: Secondary | ICD-10-CM

## 2018-09-21 DIAGNOSIS — Z8249 Family history of ischemic heart disease and other diseases of the circulatory system: Secondary | ICD-10-CM

## 2018-09-21 DIAGNOSIS — Z1211 Encounter for screening for malignant neoplasm of colon: Secondary | ICD-10-CM

## 2018-09-21 DIAGNOSIS — Z780 Asymptomatic menopausal state: Secondary | ICD-10-CM

## 2018-09-21 MED ORDER — AMLODIPINE BESYLATE 10 MG PO TABS: ORAL_TABLET | ORAL | 3 refills | Status: DC

## 2018-09-21 MED ORDER — SERTRALINE HCL 25 MG PO TABS: 25.0000 mg | ORAL_TABLET | Freq: Every day | ORAL | 2 refills | Status: DC

## 2018-09-21 MED ORDER — HYDROCHLOROTHIAZIDE 25 MG PO TABS: 25.0000 mg | ORAL_TABLET | Freq: Every day | ORAL | 3 refills | Status: DC

## 2018-09-21 NOTE — Progress Notes (Signed)
Subjective:    [name] is a 67 y.o. female who presents for Preventative Services visit and chronic medical problems/med check visit.    Primary Care Provider Tysinger, Kermit Balo, PA-C here for primary care  Current Health Care Team: Dentist,  Eye doctor  Medical Services you may have received from other than [facility] providers in the past year (date may be approximate) none  Exercise Current exercise habits: some  Nutrition/Diet Current diet: varies  Depression Screen Depression screen [facility] 2/9 09/21/2018  Decreased Interest 2  Down, Depressed, Hopeless 1  PHQ - 2 Score 3  Altered sleeping 3  Tired, decreased energy 2  Change in appetite 2  Feeling bad or failure about yourself  3  Trouble concentrating 0  Moving slowly or fidgety/restless 2  Suicidal thoughts 0  PHQ-9 Score 15  Difficult doing work/chores Somewhat difficult    Activities of Daily Living Screen/Functional Status Survey Is the patient deaf or have difficulty hearing?: No Does the patient have difficulty seeing, even when wearing glasses/contacts?: No Does the patient have difficulty concentrating, remembering, or making decisions?: No Does the patient have difficulty walking or climbing stairs?: No Does the patient have difficulty dressing or bathing?: No Does the patient have difficulty doing errands alone such as visiting a doctor's office or shopping?: No  Can patient draw a clock face showing 3:15 oclock, yes  Fall Risk Screen Fall Risk  09/21/2018 08/05/2017 10/30/2015  Falls in the past year? No Yes No  Number falls in past yr: - 1 -  Injury with Fall? - Yes -    Gait Assessment: Normal gait observed yes   Past Medical History:  Diagnosis Date  . Allergic rhinitis, cause unspecified   . Arthritis   . Chewing tobacco use   . Coronary artery disease, non-occlusive December 2006  . Dyslipidemia   . Essential hypertension    previously uncontrolled, now well-controlled; no evidence of  renal artery stenosis  . GERD (gastroesophageal reflux disease)   . H/O echocardiogram 12/2014   60-65 % EF, mild basal hypertrophy focally at septum, otherwise normal  . Heart murmur 12/2014   mild focal hypertrophy of septum, EF 60-65%, trivial aortic regurgitation  . History of cardiac catheterization December 2006   for exertional chest pain: Nonobstructive CAD  . History of cardiovascular stress test 12/2014   perfusion study; low risk study.  LV Wall Motion: NL LV Function; NL Wall Motion; LVEF 63%.  Dr. Rennis Golden  . History of migraine headaches    not an issues as of 07/2017  . Onychomycosis 05/2015  . Pneumococcal vaccine refused 05/2015    Past Surgical History:  Procedure Laterality Date  . ABDOMINAL HYSTERECTOMY     PARTIAL (benign, for bleeding), still has ovaries  . CARDIAC CATHETERIZATION  12/06   Dr. Clarene Duke; no significant CAD. Normal LV function with EF of 60-65%. Also noted no RAS.  Marland Kitchen CAROTID ANGIOGRAM  2005  . COLONOSCOPY  2009   Dr. Loreta Ave  . ESOPHAGOGASTRODUODENOSCOPY  2009   Dr. Loreta Ave  . EYE SURGERY     new lenses 07/2017  . LAPAROSCOPY  12/02   ovarian cyst    Social History   Socioeconomic History  . Marital status: Married    Spouse name: Not on file  . Number of children: 3  . Years of education: Not on file  . Highest education level: Not on file  Occupational History  . 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 Financial resource strain: Not on file  . Food insecurity:    Worry: Not on file    Inability: Not on file  . Transportation needs:    Medical: Not on file    Non-medical: Not on file  Tobacco Use  . Smoking status: Never Smoker  . Smokeless tobacco: Current User    Types: Snuff  . Tobacco comment: hhas been using since she was a teenager  Substance and Sexual Activity  . Alcohol use: Yes    Alcohol/week: 30.0 standard drinks    Types: 30 Cans of beer per week    Comment: 12 drinks per week.  . Drug use: No    . Sexual activity: Never    Birth control/protection: None  Lifestyle  . Physical activity:    Days per week: Not on file    Minutes per session: Not on file  . Stress: Not on file  Relationships  . Social connections:    Talks on phone: Not on file    Gets together: Not on file    Attends religious service: Not on file    Active member of club or organization: Not on file    Attends meetings of clubs or organizations: Not on file    Relationship status: Not on file  . Intimate partner violence:    Fear of current or ex partner: Not on file    Emotionally abused: Not on file    Physically abused: Not on file    Forced sexual activity: Not on file  Other Topics Concern  . Not on file  Social History Narrative   Widowed 2016.  Has new boyfriend as of 10/2015, just started riding motorcycle, trying to get new enjoyment in life since being widowed.        Was married 43 years with Deeann Cree.       Has been dipping snuff since she was a teenager.      She drinks 2-3 Sherilyn Dacosta Beers most days out of the week      She "does not do as much exercise as she should, walking maybe 1 day week.    Family History  Problem Relation Age of Onset  . Heart disease Mother        CHF  . Hypertension Mother   . Heart disease Father 35       MI  . Heart disease Brother   . Heart disease Maternal Aunt   . Diabetes Maternal Aunt   . Cancer Maternal Aunt        cervical  . Heart disease Maternal Uncle   . Diabetes Maternal Uncle   . Heart disease Maternal Grandmother        CHF  . Diabetes Maternal Grandmother   . Heart disease Paternal Uncle   . Heart disease Brother   . Heart disease Brother      Current Outpatient Medications:  .  amLODipine (NORVASC) 10 MG tablet, TAKE 1 TABLET(10 MG) BY MOUTH DAILY, Disp: 90 tablet, Rfl: 3 .  hydrochlorothiazide (HYDRODIURIL) 25 MG tablet, Take 1 tablet (25 mg total) by mouth daily., Disp: 90 tablet, Rfl: 3 .  sertraline (ZOLOFT) 25 MG  tablet, Take 1 tablet (25 mg total) by mouth daily., Disp: 30 tablet, Rfl: 2  Allergies  Allergen Reactions  . Morphine And Related Other (See Comments)    Palpitations and shaking.    History reviewed: allergies, current medications, past family history, past medical history, past  social history, past surgical history and problem list  Chronic issues discussed: HTN - compliant with medication, Amlodipine and HCTZ  Hyperlipidemia - not taking statin, refuses statin   Acute issues discussed: Lately having issues sleeping, dealing with family stress, son in law diagnosed recently with cancer.  Specifically requested Zoloft as her friend does well on this.  Objective:      BP 140/80 (BP Location: Left Arm, Patient Position: Sitting)   Pulse 68   Temp 98.1 F (36.7 C)   Ht 5\' 3"  (1.6 m)   Wt 150 lb 3.2 oz (68.1 kg)   SpO2 92%   BMI 26.61 kg/m   BP Readings from Last 3 Encounters:  09/21/18 140/80  08/05/17 (!) 144/70  01/19/17 120/70    Biometrics BP 140/80 (BP Location: Left Arm, Patient Position: Sitting)   Pulse 68   Temp 98.1 F (36.7 C)   Ht 5\' 3"  (1.6 m)   Wt 150 lb 3.2 oz (68.1 kg)   SpO2 92%   BMI 26.61 kg/m   Gen: wd, wn, nad, white female HEENT: normocephalic, sclerae anicteric, TMs pearly, nares patent, no discharge or erythema, pharynx normal Oral cavity: MMM, no lesions Neck: supple, no lymphadenopathy, no thyromegaly, no masses, no bruits Heart: RRR, normal S1, S2, no murmurs Lungs: CTA bilaterally, no wheezes, rhonchi, or rales Abdomen: +bs, soft, non tender, non distended, no masses, no hepatomegaly, no splenomegaly Musculoskeletal: nontender, no swelling, no obvious deformity Extremities: no edema, no cyanosis, no clubbing Pulses: 2+ symmetric, upper and lower extremities, normal cap refill Neurological: alert, oriented x 3, CN2-12 intact, strength normal upper extremities and lower extremities, sensation normal throughout, DTRs 2+ throughout,  no cerebellar signs, gait normal Psychiatric: normal affect, behavior normal, pleasant  Breast/gyn/ rectal-declined  Assessment:   Encounter Diagnoses  Name Primary?  . Essential hypertension - previously difficult to control Yes  . Coronary artery disease involving native heart without angina pectoris, unspecified vessel or lesion type   . Encounter for health maintenance examination in adult   . Chewing tobacco use   . Dyslipidemia   . Gastroesophageal reflux disease without esophagitis   . Family history of premature CAD   . Heart murmur   . Post-menopausal   . Screen for colon cancer   . Smokeless tobacco use   . Vaccine counseling   . Noncompliance   . Insomnia, unspecified type   . Mood change   . Abnormal thyroid blood test   . Alcohol abuse      Plan:   A preventative services visit was completed today.  During the course of the visit today, we discussed and counseled about appropriate screening and preventive services.  A health risk assessment was established today that included a review of current medications, allergies, social history, family history, medical and preventative health history, biometrics, and preventative screenings to identify potential safety concerns or impairments.  A personalized plan was printed today for your records and use.   Personalized health advice and education was given today to reduce health risks and promote self management and wellness.  Information regarding end of life planning was discussed today.  Conditions/risks identified: Alcohol abuse noncompliance  Chronic problems discussed today: Hypertension-not at goal, compliant with medication but not willing to add additional medicine to get to goal.  Counseled on salt intake, exercise, diet, weight loss  Hyperlipidemia- she refuses a statin, discussed other alternatives, currently not willing to take medication.  Discussed risks of sub optimal treatment  Recommendations a  bone  density scan.  Gave contact information to call and schedule this  Counseled on alcohol abuse  Counseled on tobacco cessation, chewing tobacco  Advised she either return for pelvic exam or establish with gynecology  Acute problems discussed today: Insomnia, family stress, depressed mood, begin Zoloft, counseled on sleep hygiene, consider counseling  Recommendations:  I recommend a yearly ophthalmology/optometry visit for glaucoma screening and eye checkup  I recommended a yearly dental visit for hygiene and checkup  Advanced directives - discussed nature and purpose of Advanced Directives, encouraged them to complete them if they have not done so and/or encouraged them to get [place] a copy if they have done this already.    Referrals today: Cologuard  Immunizations: Declines flu shot today, says she will return in a week for this as she does not want a sore arm this weekend  Shingles vaccine:  I recommend you have a shingles vaccine to help prevent shingles or herpes zoster outbreak.   Please call your insurer to inquire about coverage for the Shingrix vaccine given in 2 doses.   Some insurers cover this vaccine after age 60, some cover this after age 64.  If your insurer covers this, then call to schedule appointment to have this vaccine here.  We discussed recommendations below: Patient Instructions  Recommendations  Vaccine Return soon for your flu shot  Shingles vaccine:  I recommend you have a shingles vaccine to help prevent shingles or herpes zoster outbreak.   Please call your insurer to inquire about coverage for the Shingrix vaccine given in 2 doses.   Some insurers cover this vaccine after age 39, some cover this after age 54.  If your insurer covers this, then call to schedule appointment to have this vaccine here.    Cancer screens We will refer you for Cologuard test  Your mammogram this week was normal  You should have a pelvic exam done.  You can return here  for this with me or I can refer you to gynecology   Please call and schedule your bone density test  [facility]  [phone] [address] [place], [place] [address]    Your blood pressure is not at goal.  Continue current medicines.  I strongly recommend he add a medicine to get your blood pressure down under 130/80.  High cholesterol, history of coronary artery disease noted on prior scan You should be taking some type of cholesterol-lowering medication to reduce your risk of heart attack and stroke.  Consider red yeast rice or niacin over-the-counter if you are not willing to take a statin       Navia was seen today for other.  Diagnoses and all orders for this visit:  Essential hypertension - previously difficult to control -     Comprehensive metabolic panel -     CBC with Differential/Platelet -     Lipid panel -     Microalbumin / creatinine urine ratio  Coronary artery disease involving native heart without angina pectoris, unspecified vessel or lesion type  Encounter for health maintenance examination in adult  Chewing tobacco use  Dyslipidemia -     Comprehensive metabolic panel -     Lipid panel  Gastroesophageal reflux disease without esophagitis  Family history of premature CAD  Heart murmur  Post-menopausal -     DG Bone Density; Future  Screen for colon cancer -     Cologuard  Smokeless tobacco use  Vaccine  counseling  Noncompliance  Insomnia, unspecified type  Mood change  Abnormal thyroid blood test -     TSH -     T4, free  Alcohol abuse  Other orders -     sertraline (ZOLOFT) 25 MG tablet; Take 1 tablet (25 mg total) by mouth daily. -     amLODipine (NORVASC) 10 MG tablet; TAKE 1 TABLET(10 MG) BY MOUTH DAILY -     hydrochlorothiazide (HYDRODIURIL) 25 MG tablet; Take 1 tablet (25 mg total) by mouth daily.     Medicare Attestation A preventative services visit was completed  today.  During the course of the visit the patient was educated and counseled about appropriate screening and preventive services.  A health risk assessment was established with the patient that included a review of current medications, allergies, social history, family history, medical and preventative health history, biometrics, and preventative screenings to identify potential safety concerns or impairments.  A personalized plan was printed today for the patient's records and use.   Personalized health advice and education was given today to reduce health risks and promote self management and wellness.  Information regarding end of life planning was discussed today.  Kristian Covey, PA-C   09/21/2018

## 2018-09-21 NOTE — Patient Instructions (Signed)
Recommendations  Vaccine Return soon for your flu shot  Shingles vaccine:  I recommend you have a shingles vaccine to help prevent shingles or herpes zoster outbreak.   Please call your insurer to inquire about coverage for the Shingrix vaccine given in 2 doses.   Some insurers cover this vaccine after age 68, some cover this after age 68.  If your insurer covers this, then call to schedule appointment to have this vaccine here.    Cancer screens We will refer you for Cologuard test  Your mammogram this week was normal  You should have a pelvic exam done.  You can return here for this with me or I can refer you to gynecology   Please call and schedule your bone density test  [facility]  [phone] [address] [place], [place] [id number]    Your blood pressure is not at goal.  Continue current medicines.  I strongly recommend he add a medicine to get your blood pressure down under 130/80.  High cholesterol, history of coronary artery disease noted on prior scan You should be taking some type of cholesterol-lowering medication to reduce your risk of heart attack and stroke.  Consider red yeast rice or niacin over-the-counter if you are not willing to take a statin

## 2018-09-22 ENCOUNTER — Other Ambulatory Visit: Payer: Self-pay | Admitting: Medical

## 2018-09-22 LAB — COMPREHENSIVE METABOLIC PANEL
ALT: 23 IU/L (ref 0–32)
AST: 34 IU/L (ref 0–40)
Albumin/Globulin Ratio: 1.2 (ref 1.2–2.2)
Albumin: 4.4 g/dL (ref 3.6–4.8)
Alkaline Phosphatase: 83 IU/L (ref 39–117)
BUN/Creatinine Ratio: 15 (ref 12–28)
BUN: 8 mg/dL (ref 8–27)
Bilirubin Total: 0.8 mg/dL (ref 0.0–1.2)
CO2: 24 mmol/L (ref 20–29)
Calcium: 9.1 mg/dL (ref 8.7–10.3)
Chloride: 103 mmol/L (ref 96–106)
Creatinine, Ser: 0.54 mg/dL — ABNORMAL LOW (ref 0.57–1.00)
GFR calc Af Amer: 113 mL/min/{1.73_m2} (ref >59)
GFR calc non Af Amer: 98 mL/min/{1.73_m2} (ref >59)
Globulin, Total: 3.7 g/dL (ref 1.5–4.5)
Glucose: 82 mg/dL (ref 65–99)
Potassium: 4.1 mmol/L (ref 3.5–5.2)
Sodium: 145 mmol/L — ABNORMAL HIGH (ref 134–144)
Total Protein: 8.1 g/dL (ref 6.0–8.5)

## 2018-09-22 LAB — MICROALBUMIN / CREATININE URINE RATIO
Creatinine, Urine: 161.9 mg/dL
Microalb/Creat Ratio: 15.9 mg/g creat (ref 0.0–30.0)
Microalbumin, Urine: 25.8 ug/mL

## 2018-09-22 LAB — CBC WITH DIFFERENTIAL/PLATELET
Basophils Absolute: 0.1 10*3/uL (ref 0.0–0.2)
Basos: 1 %
EOS (ABSOLUTE): 0.1 10*3/uL (ref 0.0–0.4)
Eos: 1 %
Hematocrit: 42 % (ref 34.0–46.6)
Hemoglobin: 14.2 g/dL (ref 11.1–15.9)
Immature Grans (Abs): 0 10*3/uL (ref 0.0–0.1)
Immature Granulocytes: 1 %
Lymphocytes Absolute: 0.9 10*3/uL (ref 0.7–3.1)
Lymphs: 14 %
MCH: 32.2 pg (ref 26.6–33.0)
MCHC: 33.8 g/dL (ref 31.5–35.7)
MCV: 95 fL (ref 79–97)
Monocytes Absolute: 0.6 10*3/uL (ref 0.1–0.9)
Monocytes: 9 %
Neutrophils Absolute: 4.8 10*3/uL (ref 1.4–7.0)
Neutrophils: 74 %
Platelets: 190 10*3/uL (ref 150–450)
RBC: 4.41 x10E6/uL (ref 3.77–5.28)
RDW: 12 % — ABNORMAL LOW (ref 12.3–15.4)
WBC: 6.4 10*3/uL (ref 3.4–10.8)

## 2018-09-22 LAB — LIPID PANEL
Chol/HDL Ratio: 2.3 ratio (ref 0.0–4.4)
Cholesterol, Total: 235 mg/dL — ABNORMAL HIGH (ref 100–199)
HDL: 102 mg/dL (ref >39)
LDL Calculated: 119 mg/dL — ABNORMAL HIGH (ref 0–99)
Triglycerides: 69 mg/dL (ref 0–149)
VLDL Cholesterol Cal: 14 mg/dL (ref 5–40)

## 2018-09-22 LAB — T4, FREE: Free T4: 1.29 ng/dL (ref 0.82–1.77)

## 2018-09-22 LAB — TSH: TSH: 0.354 u[IU]/mL — ABNORMAL LOW (ref 0.450–4.500)

## 2018-09-27 ENCOUNTER — Telehealth: Payer: Self-pay | Admitting: Medical

## 2018-09-27 NOTE — Telephone Encounter (Signed)
[facility]. Sending back for review.

## 2018-10-12 ENCOUNTER — Encounter: Payer: Self-pay | Admitting: Internal Medicine

## 2018-11-07 ENCOUNTER — Ambulatory Visit: Payer: Self-pay | Admitting: Medical

## 2018-12-15 ENCOUNTER — Telehealth: Payer: Self-pay

## 2018-12-15 NOTE — Telephone Encounter (Signed)
Called patient to asked if she has done her Cologuard and she states she will do it asap   She has had a lot going on with her son-in-law has cancer and she is having to do a lot of running with him.

## 2019-02-23 ENCOUNTER — Telehealth: Payer: Self-pay | Admitting: Internal Medicine

## 2019-02-23 ENCOUNTER — Other Ambulatory Visit (INDEPENDENT_AMBULATORY_CARE_PROVIDER_SITE_OTHER): Payer: BC Managed Care – PPO

## 2019-02-23 DIAGNOSIS — Z111 Encounter for screening for respiratory tuberculosis: Secondary | ICD-10-CM

## 2019-02-23 NOTE — Telephone Encounter (Signed)
Pt will actually now come by on Monday to get this form and results

## 2019-02-23 NOTE — Telephone Encounter (Signed)
Pt came by today for quantiferon gold test and had a form to be filled out for her job to start work on Tuesday. Pt needs form and lab results completed and faxed back by Monday. This form has been put in your folder

## 2019-02-25 LAB — QUANTIFERON-TB GOLD PLUS
QuantiFERON Mitogen Value: 3.86 IU/mL
QuantiFERON Nil Value: 0.02 IU/mL
QuantiFERON TB1 Ag Value: 0.03 IU/mL
QuantiFERON TB2 Ag Value: 0.02 IU/mL
QuantiFERON-TB Gold Plus: NEGATIVE

## 2019-06-29 ENCOUNTER — Ambulatory Visit (INDEPENDENT_AMBULATORY_CARE_PROVIDER_SITE_OTHER): Payer: Medicare Other | Admitting: Medical

## 2019-06-29 ENCOUNTER — Encounter: Payer: Self-pay | Admitting: Medical

## 2019-06-29 ENCOUNTER — Other Ambulatory Visit: Payer: Self-pay

## 2019-06-29 VITALS — BP 130/70 | HR 60 | Temp 98.8°F | Resp 16 | Wt 154.0 lb

## 2019-06-29 DIAGNOSIS — E785 Hyperlipidemia, unspecified: Secondary | ICD-10-CM

## 2019-06-29 DIAGNOSIS — F1011 Alcohol abuse, in remission: Secondary | ICD-10-CM | POA: Insufficient documentation

## 2019-06-29 DIAGNOSIS — I10 Essential (primary) hypertension: Secondary | ICD-10-CM

## 2019-06-29 DIAGNOSIS — R7989 Other specified abnormal findings of blood chemistry: Secondary | ICD-10-CM

## 2019-06-29 DIAGNOSIS — E876 Hypokalemia: Secondary | ICD-10-CM

## 2019-06-29 DIAGNOSIS — R42 Dizziness and giddiness: Secondary | ICD-10-CM | POA: Diagnosis not present

## 2019-06-29 MED ORDER — MECLIZINE HCL 25 MG PO TABS: 25.0000 mg | ORAL_TABLET | Freq: Two times a day (BID) | ORAL | 0 refills | Status: DC

## 2019-06-29 NOTE — Progress Notes (Signed)
Subjective: Chief Complaint  Patient presents with  . dizzy,    dizzy, vertigo X 3 months   Here for dizziness, possible vertigo.  When first getting out of bed head feels spinning, like no control of head.   After she gets up, symptoms last for about an hour.  Then it will be improved but still can come and go.   Yesterday it was persistent throughout the day.  Has had vertigo in the past, feels the same as in the past, but has been persistent for months.      In general she is compliant with her BP medication.  She drinks a lot of water.  She is taking the Amlodipine in the morning, she notes that she quit taking the HCTZ at night as the HCTZ was making her dizzy.  Drinks several beers daily, chronic for years.  No chest pain, no dyspnea, no palpitations.  Does get cramps in the legs.  Uses mustard for leg cramps.   No vision changes, no vision loss.  No syncope or falls.   No confusion.  No numbness no tingling no unilateral weakness.  No recent bruising or bleeding.  No other aggravating or relieving factors. No other complaint.    Past Medical History:  Diagnosis Date  . Allergic rhinitis, cause unspecified   . Arthritis   . Chewing tobacco use   . Coronary artery disease, non-occlusive December 2006  . Dyslipidemia   . Essential hypertension    previously uncontrolled, now well-controlled; no evidence of renal artery stenosis  . GERD (gastroesophageal reflux disease)   . H/O echocardiogram 12/2014   60-65 % EF, mild basal hypertrophy focally at septum, otherwise normal  . Heart murmur 12/2014   mild focal hypertrophy of septum, EF 60-65%, trivial aortic regurgitation  . History of cardiac catheterization December 2006   for exertional chest pain: Nonobstructive CAD  . History of cardiovascular stress test 12/2014   perfusion study; low risk study.  LV Wall Motion: NL LV Function; NL Wall Motion; LVEF 63%.  Dr. Debara Pickett  . History of migraine headaches    not an issues as of 07/2017   . Onychomycosis 05/2015  . Pneumococcal vaccine refused 05/2015   Current Outpatient Medications on File Prior to Visit  Medication Sig Dispense Refill  . amLODipine (NORVASC) 10 MG tablet TAKE 1 TABLET(10 MG) BY MOUTH DAILY 90 tablet 3  . hydrochlorothiazide (HYDRODIURIL) 25 MG tablet Take 1 tablet (25 mg total) by mouth daily. 90 tablet 3  . sertraline (ZOLOFT) 25 MG tablet Take 1 tablet (25 mg total) by mouth daily. 30 tablet 2   No current facility-administered medications on file prior to visit.    ROS as in subjective    Objective: BP 130/70   Pulse 60   Temp 98.8 F (37.1 C) (Oral)   Resp 16   Wt 154 lb (69.9 kg)   SpO2 96%   BMI 27.28 kg/m   Wt Readings from Last 3 Encounters:  06/29/19 154 lb (69.9 kg)  09/21/18 150 lb 3.2 oz (68.1 kg)  08/05/17 140 lb 6.4 oz (63.7 kg)     General appearance: alert, no distress, WD/WN, white female HEENT: normocephalic, sclerae anicteric, PERRLA, EOMi, nares patent, no discharge or erythema, pharynx normal Oral cavity: MMM, no lesions Neck: supple, no lymphadenopathy, no thyromegaly, no masses Heart: 2/6 holosystolic murmur heart in left upper sternal border, otherwise RRR, normal S1, S2 Lungs: CTA bilaterally, no wheezes, rhonchi, or rales Extremities: no edema,  no cyanosis, no clubbing Pulses: 2+ symmetric, upper and lower extremities, normal cap refill Neurological: somewhat off balance with heel to toe, otherwise alert, oriented x 3, CN2-12 intact, strength normal upper extremities and lower extremities, sensation normal throughout, DTRs 1+ throughout, Romberg normal, gait normal Psychiatric: normal affect, behavior normal, pleasant       Assessment: Encounter Diagnoses  Name Primary?  . Dizziness Yes  . Essential hypertension - previously difficult to control   . Dyslipidemia   . Hypokalemia   . Abnormal thyroid blood test   . Vertigo   . History of alcohol abuse      Plan: We discussed her symptoms and  concerns.  Vital signs seems stable.  I reviewed her 2016 echocardiogram and labs from this past year.  She has a history of alcohol abuse but she continues to drink her typical amount of alcohol.  We discussed potential differential for symptoms.  We will check labs today.  We will begin meclizine for potential vertigo.  We discussed other therapies for vertigo including vestibular rehab.  We discussed symptoms that would prompt called 911 or emergency department visit over the weekend if she were to develop worsening symptoms.  Advise daily weights and advise she keep her hydration consistent.  In general I recommended she cut back on alcohol.   Marylu LundJanet was seen today for dizzy,.  Diagnoses and all orders for this visit:  Dizziness -     Comprehensive metabolic panel -     CBC with Differential/Platelet -     TSH -     T4, free -     Vitamin B12 -     Folate  Essential hypertension - previously difficult to control -     Comprehensive metabolic panel -     CBC with Differential/Platelet -     TSH -     T4, free -     Vitamin B12 -     Folate  Dyslipidemia -     Comprehensive metabolic panel -     CBC with Differential/Platelet -     TSH -     T4, free -     Vitamin B12 -     Folate  Hypokalemia -     Comprehensive metabolic panel -     CBC with Differential/Platelet -     TSH -     T4, free -     Vitamin B12 -     Folate  Abnormal thyroid blood test -     Comprehensive metabolic panel -     CBC with Differential/Platelet -     TSH -     T4, free -     Vitamin B12 -     Folate  Vertigo -     Comprehensive metabolic panel -     CBC with Differential/Platelet -     TSH -     T4, free -     Vitamin B12 -     Folate  History of alcohol abuse  Other orders -     meclizine (ANTIVERT) 25 MG tablet; Take 1 tablet (25 mg total) by mouth 2 (two) times daily.

## 2019-06-30 LAB — TSH: TSH: 0.726 u[IU]/mL (ref 0.450–4.500)

## 2019-06-30 LAB — COMPREHENSIVE METABOLIC PANEL
ALT: 21 IU/L (ref 0–32)
AST: 28 IU/L (ref 0–40)
Albumin/Globulin Ratio: 1.3 (ref 1.2–2.2)
Albumin: 4.5 g/dL (ref 3.8–4.8)
Alkaline Phosphatase: 82 IU/L (ref 39–117)
BUN/Creatinine Ratio: 10 — ABNORMAL LOW (ref 12–28)
BUN: 7 mg/dL — ABNORMAL LOW (ref 8–27)
Bilirubin Total: 1.2 mg/dL (ref 0.0–1.2)
CO2: 27 mmol/L (ref 20–29)
Calcium: 9.7 mg/dL (ref 8.7–10.3)
Chloride: 97 mmol/L (ref 96–106)
Creatinine, Ser: 0.68 mg/dL (ref 0.57–1.00)
GFR calc Af Amer: 104 mL/min/{1.73_m2} (ref >59)
GFR calc non Af Amer: 90 mL/min/{1.73_m2} (ref >59)
Globulin, Total: 3.4 g/dL (ref 1.5–4.5)
Glucose: 106 mg/dL — ABNORMAL HIGH (ref 65–99)
Potassium: 3.4 mmol/L — ABNORMAL LOW (ref 3.5–5.2)
Sodium: 142 mmol/L (ref 134–144)
Total Protein: 7.9 g/dL (ref 6.0–8.5)

## 2019-06-30 LAB — CBC WITH DIFFERENTIAL/PLATELET
Basophils Absolute: 0 10*3/uL (ref 0.0–0.2)
Basos: 1 %
EOS (ABSOLUTE): 0.1 10*3/uL (ref 0.0–0.4)
Eos: 1 %
Hematocrit: 40.6 % (ref 34.0–46.6)
Hemoglobin: 14.6 g/dL (ref 11.1–15.9)
Immature Grans (Abs): 0 10*3/uL (ref 0.0–0.1)
Immature Granulocytes: 0 %
Lymphocytes Absolute: 1.8 10*3/uL (ref 0.7–3.1)
Lymphs: 28 %
MCH: 33.2 pg — ABNORMAL HIGH (ref 26.6–33.0)
MCHC: 36 g/dL — ABNORMAL HIGH (ref 31.5–35.7)
MCV: 92 fL (ref 79–97)
Monocytes Absolute: 0.8 10*3/uL (ref 0.1–0.9)
Monocytes: 13 %
Neutrophils Absolute: 3.7 10*3/uL (ref 1.4–7.0)
Neutrophils: 57 %
Platelets: 181 10*3/uL (ref 150–450)
RBC: 4.4 x10E6/uL (ref 3.77–5.28)
RDW: 11.5 % — ABNORMAL LOW (ref 11.7–15.4)
WBC: 6.4 10*3/uL (ref 3.4–10.8)

## 2019-06-30 LAB — VITAMIN B12: Vitamin B-12: 654 pg/mL (ref 232–1245)

## 2019-06-30 LAB — T4, FREE: Free T4: 1.23 ng/dL (ref 0.82–1.77)

## 2019-06-30 LAB — FOLATE: Folate: 15.4 ng/mL (ref >3.0)

## 2019-07-03 ENCOUNTER — Other Ambulatory Visit: Payer: Self-pay | Admitting: Medical

## 2019-07-03 MED ORDER — POTASSIUM CHLORIDE ER 10 MEQ PO TBCR: 10.0000 meq | EXTENDED_RELEASE_TABLET | Freq: Two times a day (BID) | ORAL | 11 refills | Status: DC

## 2019-07-17 ENCOUNTER — Ambulatory Visit: Payer: BC Managed Care – PPO | Admitting: Medical

## 2019-11-09 ENCOUNTER — Other Ambulatory Visit: Payer: Self-pay | Admitting: Medical

## 2019-11-30 ENCOUNTER — Telehealth: Payer: Self-pay | Admitting: Medical

## 2019-11-30 NOTE — Telephone Encounter (Signed)
Patient was informed that medication was already called into the pharmacy for her on 11/17/19. She will call if they don't have it at the pharmacy because she thinks she may have canceled it by mistake.

## 2019-11-30 NOTE — Telephone Encounter (Signed)
Pt called and scheduled a MWV for January. Needs BP med until then. Please send to [facility]. Pt can be reached [phone].

## 2020-01-15 ENCOUNTER — Other Ambulatory Visit: Payer: Self-pay

## 2020-01-15 ENCOUNTER — Telehealth: Payer: Self-pay

## 2020-01-15 MED ORDER — AMLODIPINE BESYLATE 10 MG PO TABS: ORAL_TABLET | ORAL | 0 refills | Status: DC

## 2020-01-15 NOTE — Telephone Encounter (Signed)
Medication has been sent to the pharmacy. 

## 2020-01-15 NOTE — Telephone Encounter (Signed)
Received fax from [facility] for a refill on the pts. Amlodipine pt. Last apt. Was 06/29/19.

## 2020-01-16 ENCOUNTER — Ambulatory Visit: Payer: BC Managed Care – PPO | Admitting: Medical

## 2020-02-23 ENCOUNTER — Ambulatory Visit (INDEPENDENT_AMBULATORY_CARE_PROVIDER_SITE_OTHER): Payer: Medicare HMO | Admitting: Medical

## 2020-02-23 ENCOUNTER — Ambulatory Visit
Admission: RE | Admit: 2020-02-23 | Discharge: 2020-02-23 | Disposition: A | Discharge status: Home / Self Care | Payer: Medicare HMO | Source: Ambulatory Visit | Attending: Medical | Admitting: Medical

## 2020-02-23 ENCOUNTER — Other Ambulatory Visit: Payer: Self-pay

## 2020-02-23 ENCOUNTER — Encounter: Payer: Self-pay | Admitting: Medical

## 2020-02-23 VITALS — BP 142/82 | HR 71 | Temp 97.5°F | Ht 63.0 in | Wt 162.6 lb

## 2020-02-23 DIAGNOSIS — I2510 Atherosclerotic heart disease of native coronary artery without angina pectoris: Secondary | ICD-10-CM

## 2020-02-23 DIAGNOSIS — I10 Essential (primary) hypertension: Secondary | ICD-10-CM | POA: Diagnosis not present

## 2020-02-23 DIAGNOSIS — R7989 Other specified abnormal findings of blood chemistry: Secondary | ICD-10-CM

## 2020-02-23 DIAGNOSIS — Z91199 Patient's noncompliance with other medical treatment and regimen due to unspecified reason: Secondary | ICD-10-CM

## 2020-02-23 DIAGNOSIS — E2839 Other primary ovarian failure: Secondary | ICD-10-CM

## 2020-02-23 DIAGNOSIS — Z720 Tobacco use: Secondary | ICD-10-CM

## 2020-02-23 DIAGNOSIS — E785 Hyperlipidemia, unspecified: Secondary | ICD-10-CM

## 2020-02-23 DIAGNOSIS — Z7189 Other specified counseling: Secondary | ICD-10-CM

## 2020-02-23 DIAGNOSIS — Z1211 Encounter for screening for malignant neoplasm of colon: Secondary | ICD-10-CM

## 2020-02-23 DIAGNOSIS — R42 Dizziness and giddiness: Secondary | ICD-10-CM

## 2020-02-23 DIAGNOSIS — Z1239 Encounter for other screening for malignant neoplasm of breast: Secondary | ICD-10-CM

## 2020-02-23 DIAGNOSIS — Z0000 Encounter for general adult medical examination without abnormal findings: Secondary | ICD-10-CM

## 2020-02-23 DIAGNOSIS — K219 Gastro-esophageal reflux disease without esophagitis: Secondary | ICD-10-CM | POA: Diagnosis not present

## 2020-02-23 DIAGNOSIS — R7301 Impaired fasting glucose: Secondary | ICD-10-CM | POA: Diagnosis not present

## 2020-02-23 DIAGNOSIS — Z8249 Family history of ischemic heart disease and other diseases of the circulatory system: Secondary | ICD-10-CM

## 2020-02-23 DIAGNOSIS — Z7185 Encounter for immunization safety counseling: Secondary | ICD-10-CM

## 2020-02-23 DIAGNOSIS — E876 Hypokalemia: Secondary | ICD-10-CM

## 2020-02-23 DIAGNOSIS — G4700 Insomnia, unspecified: Secondary | ICD-10-CM

## 2020-02-23 DIAGNOSIS — M7989 Other specified soft tissue disorders: Secondary | ICD-10-CM

## 2020-02-23 DIAGNOSIS — F1011 Alcohol abuse, in remission: Secondary | ICD-10-CM

## 2020-02-23 DIAGNOSIS — R600 Localized edema: Secondary | ICD-10-CM | POA: Diagnosis not present

## 2020-02-23 DIAGNOSIS — Z9181 History of falling: Secondary | ICD-10-CM

## 2020-02-23 DIAGNOSIS — Z9119 Patient's noncompliance with other medical treatment and regimen: Secondary | ICD-10-CM

## 2020-02-23 DIAGNOSIS — Z780 Asymptomatic menopausal state: Secondary | ICD-10-CM

## 2020-02-23 LAB — POCT URINALYSIS DIP (PROADVANTAGE DEVICE)
Blood, UA: NEGATIVE
Glucose, UA: NEGATIVE mg/dL
Leukocytes, UA: NEGATIVE
Nitrite, UA: NEGATIVE
Specific Gravity, Urine: 1.025
Urobilinogen, Ur: 0.2
pH, UA: 6 (ref 5.0–8.0)

## 2020-02-23 NOTE — Progress Notes (Signed)
Subjective:    [name] is a 69 y.o. female who presents for Preventative Services visit and chronic medical problems/med check visit.    Primary Care Provider Tysinger, Kermit Balo, PA-C here for primary care  Current Health Care Team:  Sees eye doctor  Not seeing dentist  Cardiology, Dr. Bryan Lemma  Medical Services you may have received from other than [facility] providers in the past year (date may be approximate) none  Exercise Current exercise habits: some, walking  Nutrition/Diet Current diet: has gained some weight.  Feels like she is eating healthy but more bread lately  Depression Screen Depression screen [facility] 2/9 02/23/2020  Decreased Interest 0  Down, Depressed, Hopeless 0  PHQ - 2 Score 0  Altered sleeping -  Tired, decreased energy -  Change in appetite -  Feeling bad or failure about yourself  -  Trouble concentrating -  Moving slowly or fidgety/restless -  Suicidal thoughts -  PHQ-9 Score -  Difficult doing work/chores -    Activities of Daily Living Screen/Functional Status Survey Is the patient deaf or have difficulty hearing?: No Does the patient have difficulty seeing, even when wearing glasses/contacts?: No Does the patient have difficulty concentrating, remembering, or making decisions?: No Does the patient have difficulty walking or climbing stairs?: Yes(not comfortable since fall few years ago) Does the patient have difficulty dressing or bathing?: No Does the patient have difficulty doing errands alone such as visiting a doctor's office or shopping?: No  Can patient draw a clock face showing 3:15 o'clock, yes  Fall Risk Screen Fall Risk  02/23/2020 09/21/2018 08/05/2017 10/30/2015  Falls in the past year? 0 No Yes No  Number falls in past yr: - - 1 -  Injury with Fall? - - Yes -    Gait Assessment: Normal gait observed - yes  Advanced directives Does patient have a Health Care Power of Attorney? No Does patient have a Living Will?  yes  Past Medical History:  Diagnosis Date  . Allergic rhinitis, cause unspecified   . Arthritis   . Chewing tobacco use   . Coronary artery disease, non-occlusive December 2006  . Dyslipidemia   . Essential hypertension    previously uncontrolled, now well-controlled; no evidence of renal artery stenosis  . GERD (gastroesophageal reflux disease)   . H/O echocardiogram 12/2014   60-65 % EF, mild basal hypertrophy focally at septum, otherwise normal  . Heart murmur 12/2014   mild focal hypertrophy of septum, EF 60-65%, trivial aortic regurgitation  . History of cardiac catheterization December 2006   for exertional chest pain: Nonobstructive CAD  . History of cardiovascular stress test 12/2014   perfusion study; low risk study.  LV Wall Motion: NL LV Function; NL Wall Motion; LVEF 63%.  Dr. Rennis Golden  . History of migraine headaches    not an issues as of 07/2017  . Onychomycosis 05/2015  . Pneumococcal vaccine refused 05/2015    Past Surgical History:  Procedure Laterality Date  . ABDOMINAL HYSTERECTOMY     PARTIAL (benign, for bleeding), still has ovaries  . CARDIAC CATHETERIZATION  12/06   Dr. Clarene Duke; no significant CAD. Normal LV function with EF of 60-65%. Also noted no RAS.  Marland Kitchen CAROTID ANGIOGRAM  2005  . COLONOSCOPY  2009   Dr. Loreta Ave  . ESOPHAGOGASTRODUODENOSCOPY  2009   Dr. Loreta Ave  . EYE SURGERY     new lenses 07/2017  . LAPAROSCOPY  12/02   ovarian cyst    Social History  Socioeconomic History  . Marital status: Married    Spouse name: Not on file  . Number of children: 3  . Years of education: Not on file  . Highest education level: Not on file  Occupational History  . 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 Smoking status: Never Smoker  . Smokeless tobacco: Current User    Types: Snuff  . Tobacco comment: hhas been using since she was a teenager  Substance and Sexual Activity  . Alcohol use: Yes    Alcohol/week: 30.0 standard drinks     Types: 30 Cans of beer per week    Comment: 12 drinks per week.  . Drug use: No  . Sexual activity: Never    Birth control/protection: None  Other Topics Concern  . Not on file  Social History Narrative   Widowed 2016.  Has new boyfriend as of 10/2015, just started riding motorcycle, trying to get new enjoyment in life since being widowed.        Was married 43 years with Deeann Cree.       Has been dipping snuff since she was a teenager.      She drinks 2-3 Sherilyn Dacosta Beers most days out of the week      She "does not do as much exercise as she should, walking maybe 1 day week.   Social Determinants of Health   Financial Resource Strain:   . Difficulty of Paying Living Expenses: Not on file  Food Insecurity:   . Worried About Programme researcher, broadcasting/film/video in the Last Year: Not on file  . Ran Out of Food in the Last Year: Not on file  Transportation Needs:   . Lack of Transportation (Medical): Not on file  . Lack of Transportation (Non-Medical): Not on file  Physical Activity:   . Days of Exercise per Week: Not on file  . Minutes of Exercise per Session: Not on file  Stress:   . Feeling of Stress : Not on file  Social Connections:   . Frequency of Communication with Friends and Family: Not on file  . Frequency of Social Gatherings with Friends and Family: Not on file  . Attends Religious Services: Not on file  . Active Member of Clubs or Organizations: Not on file  . Attends Banker Meetings: Not on file  . Marital Status: Not on file  Intimate Partner Violence:   . Fear of Current or Ex-Partner: Not on file  . Emotionally Abused: Not on file  . Physically Abused: Not on file  . Sexually Abused: Not on file    Family History  Problem Relation Age of Onset  . Heart disease Mother        CHF  . Hypertension Mother   . Heart disease Father 68       MI  . Heart disease Brother   . Heart disease Maternal Aunt   . Diabetes Maternal Aunt   . Cancer Maternal  Aunt        cervical  . Heart disease Maternal Uncle   . Diabetes Maternal Uncle   . Heart disease Maternal Grandmother        CHF  . Diabetes Maternal Grandmother   . Heart disease Paternal Uncle   . Heart disease Brother   . Heart disease Brother      Current Outpatient Medications:  .  amLODipine (NORVASC) 10 MG tablet, TAKE 1 TABLET(10 MG) BY MOUTH DAILY, Disp:  90 tablet, Rfl: 0 .  aspirin 81 MG chewable tablet, Chew by mouth daily., Disp: , Rfl:  .  hydrochlorothiazide (HYDRODIURIL) 25 MG tablet, Take 1 tablet (25 mg total) by mouth daily., Disp: 90 tablet, Rfl: 3 .  meclizine (ANTIVERT) 25 MG tablet, Take 1 tablet (25 mg total) by mouth 2 (two) times daily. (Patient not taking: Reported on 02/23/2020), Disp: 30 tablet, Rfl: 0 .  potassium chloride (K-DUR) 10 MEQ tablet, Take 1 tablet (10 mEq total) by mouth 2 (two) times daily. (Patient not taking: Reported on 02/23/2020), Disp: 60 tablet, Rfl: 11 .  sertraline (ZOLOFT) 25 MG tablet, Take 1 tablet (25 mg total) by mouth daily., Disp: 30 tablet, Rfl: 2  Allergies  Allergen Reactions  . Morphine And Related Other (See Comments)    Palpitations and shaking.    History reviewed: allergies, current medications, past family history, past medical history, past social history, past surgical history and problem list  Chronic issues discussed: Compliant with BP medication  Acute issues discussed: Left leg has been swollen of late.  She does have hx/o left leg swollen compared to right.   No pain in calve.  No injury, no trauma.  No hx/o DVT.    Objective:      Biometrics BP (!) 142/82 (BP Location: Left Arm, Patient Position: Sitting)   Pulse 71   Temp (!) 97.5 F (36.4 C)   Ht 5\' 3"  (1.6 m)   Wt 162 lb 9.6 oz (73.8 kg)   SpO2 96%   BMI 28.80 kg/m    BP Readings from Last 3 Encounters:  02/23/20 (!) 142/82  06/29/19 130/70  09/21/18 140/80   Wt Readings from Last 3 Encounters:  02/23/20 162 lb 9.6 oz (73.8 kg)    06/29/19 154 lb (69.9 kg)  09/21/18 150 lb 3.2 oz (68.1 kg)     Cognitive Testing  Alert? Yes  Normal Appearance?Yes  Oriented to person? Yes  Place? Yes   Time? Yes  Recall of three objects?  Yes  Can perform simple calculations? Yes  Displays appropriate judgment?Yes  Can read the correct time from a watch face?Yes  General appearance: alert, no distress, WD/WN, white female  Nutritional Status: Inadequate calore intake? no Loss of muscle mass? yes Loss of fat beneath skin? no Localized or general edema? no Diminished functional status? no  Other pertinent exam: Neck: supple, no lymphadenopathy, no thyromegaly, no masses, no bruits Heart: RRR, normal S1, S2, no murmurs Lungs: CTA bilaterally, no wheezes, rhonchi, or rales Abdomen: +bs, soft, non tender, non distended, no masses, no hepatomegaly, no splenomegaly Musculoskeletal: nontender, no swelling, no obvious deformity Extremities: unilaterally left leg swelling 1+ pitting, but no calve tendnerss, -homans, otherwise no edema, no cyanosis, no clubbing Pulses: 2+ symmetric, upper and lower extremities, normal cap refill Neurological: alert, oriented x 3, CN2-12 intact, strength normal upper extremities and lower extremities, sensation normal throughout, DTRs 2+ throughout, no cerebellar signs, gait normal Psychiatric: normal affect, behavior normal, pleasant   Declines breast and pelvic exam   EKG  Indication physical, high blood pressure, rate 70 bpm, PR 128 ms, QRS 82 ms, QTC 453 ms, Axis XI degrees, normal sinus rhythm   Assessment:   Encounter Diagnoses  Name Primary?  . Encounter for health maintenance examination in adult Yes  . Medicare annual wellness visit, subsequent   . Essential hypertension - previously difficult to control   . Coronary artery disease involving native heart without angina pectoris, unspecified vessel or lesion  type   . Gastroesophageal reflux disease without esophagitis   . Family  history of premature CAD   . Chewing tobacco use   . Dyslipidemia   . Vaccine counseling   . Encounter for screening for malignant neoplasm of breast, unspecified screening modality   . Hypokalemia   . Post-menopausal   . History of fall   . Noncompliance   . Insomnia, unspecified type   . Abnormal thyroid blood test   . History of alcohol abuse   . Vertigo   . Screen for colon cancer   . Estrogen deficiency   . Impaired fasting blood sugar   . Left leg swelling   . Advanced directives, counseling/discussion      Plan:   A preventative services visit was completed today.  During the course of the visit today, we discussed and counseled about appropriate screening and preventive services.  A health risk assessment was established today that included a review of current medications, allergies, social history, family history, medical and preventative health history, biometrics, and preventative screenings to identify potential safety concerns or impairments.  A personalized plan was printed today for your records and use.   Personalized health advice and education was given today to reduce health risks and promote self management and wellness.  Information regarding end of life planning was discussed today.  Expressed empathy for her unfortunately break in at her home yesterday    Conditions/risks identified: Left leg edema , new   Chronic problems discussed today: HTN - c/t current medications  counseled against tobacco use  Counseled on alcohol use  Dyslipidemia-consider statin again.  She will consider  Vertigo-no recent issue  History of insomnia-no recent issue  Impaired glucose-labs today  Acute problems discussed today: Left leg swelling - US doppler 1st, and if no DVT, will pursue CT abdomen pelvis to rule out mass on venous circulation that could be causing symptoms .    Recommendations:  I recommend a yearly ophthalmology/optometry visit for glaucoma  screening and eye checkup  I recommended a yearly dental visit for hygiene and checkup  Advanced directives - discussed nature and purpose of Advanced Directives, encouraged them to complete them if they have not done so and/or encouraged them to get [place] a copy if they have done this already.  Referrals today: Cologuard test  [place] of left leg to rule out DVT  Bone density and mammogram    Immunizations: Up to date on Prenvar 13, flu and Td.  Advised covid, shingles and repeat pneumococcal 23 vaccine.   She plans to get the Wynetta Emery and Wynetta Emery covid vaccine when it comes available soon   Ieesha was seen today for other.  Diagnoses and all orders for this visit:  Encounter for health maintenance examination in adult -     POCT Urinalysis DIP (Proadvantage Device) -     TSH -     CBC with Differential -     Comprehensive metabolic panel -     Lipid Panel -     MM DIGITAL SCREENING BILATERAL; Future -     DG Bone Density; Future -     Hemoglobin A1c -     US Venous Img Lower Unilateral Left (DVT); Future -     Cologuard -     EKG 12-Lead  Medicare annual wellness visit, subsequent  Essential hypertension - previously difficult to control -     Comprehensive metabolic panel -     Lipid Panel -  EKG 12-Lead  Coronary artery disease involving native heart without angina pectoris, unspecified vessel or lesion type  Gastroesophageal reflux disease without esophagitis  Family history of premature CAD  Chewing tobacco use  Dyslipidemia -     Lipid Panel  Vaccine counseling  Encounter for screening for malignant neoplasm of breast, unspecified screening modality -     MM DIGITAL SCREENING BILATERAL; Future  Hypokalemia  Post-menopausal -     DG Bone Density; Future  History of fall  Noncompliance  Insomnia, unspecified type  Abnormal thyroid blood test -     TSH  History of alcohol abuse  Vertigo  Screen for colon cancer -     Cologuard  Estrogen  deficiency -     DG Bone Density; Future  Impaired fasting blood sugar -     Hemoglobin A1c  Left leg swelling -     US Venous Img Lower Unilateral Left (DVT); Future  Advanced directives, counseling/discussion     Medicare Attestation A preventative services visit was completed today.  During the course of the visit the patient was educated and counseled about appropriate screening and preventive services.  A health risk assessment was established with the patient that included a review of current medications, allergies, social history, family history, medical and preventative health history, biometrics, and preventative screenings to identify potential safety concerns or impairments.  A personalized plan was printed today for the patient's records and use.   Personalized health advice and education was given today to reduce health risks and promote self management and wellness.  Information regarding end of life planning was discussed today.  Kristian Covey, PA-C   02/23/2020

## 2020-02-23 NOTE — Patient Instructions (Signed)
Preventative Care for Adults - Female   Thank you for coming in for your well visit today, and thank you for trusting [place] with your care!   Maintain regular health and wellness exams:  A routine yearly physical is a good way to check in with your primary care provider about your health and preventive screening. It is also an opportunity to share updates about your health and any concerns you have, and receive a thorough all-over exam.   Most health insurance companies pay for at least some preventative services.  Check with your health plan for specific coverages.  What preventative services do women need?  Adult women should have their weight and blood pressure checked regularly.   Women age 35 and older should have their cholesterol levels checked regularly.  Women should be screened for cervical cancer with a Pap smear and pelvic exam beginning at either age 52, or 3 years after they become sexually activity.    Breast cancer screening generally begins at age 19 with a mammogram and breast exam by your primary care provider.    Beginning at age 43 and continuing to age 78, women should be screened for colorectal cancer.  Certain people may need continued testing until age 23.  Updating vaccinations is part of preventative care.  Vaccinations help protect against diseases such as the flu.  Osteoporosis is a disease in which the bones lose minerals and strength as we age. Women ages 71 and over should discuss this with their caregivers, as should women after menopause who have other risk factors.  Lab tests are generally done as part of preventative care to screen for anemia and blood disorders, to screen for problems with the kidneys and liver, to screen for bladder problems, to check blood sugar, and to check your cholesterol level.  Preventative services generally include counseling about diet, exercise, avoiding tobacco, drugs, excessive alcohol consumption, and sexually transmitted  infections.    Xrays and CT scans are not normally done as a preventative test, and most insurances do not pay for imaging for screening other than as discussed under cancer screens below.   On the other hand, if you have certain medical concerns, imaging may be necessary as a diagnostic test.   Your Medical Team Your medical team starts with [place], your PCP or primary care provider.  Please use our services for your routine care such as physicals, screenings, immunizations, sick visits, and your first stop for general medical concerns.  You can call our number for after hours information for urgent questions that may need attention but cannot wait til the next business day.    Urgent care-urgent cares exist to provide care when your primary care office would typically be closed such as evenings or weekends.   Urgent care is for evaluation of urgent medical problems that do not necessarily require emergency department care, but cannot wait til the next business day when we are open.  Emergency department care-please reserve emergency department care for serious, urgent, possibly life-threatening medical problems.  This includes issues like possible stroke, heart attack, significant injury, mental health crisis, or other urgent need that requires immediate medical attention.     See your dentist office twice yearly for hygiene and cleaning visits.   Brush your teeth and floss your teeth daily.  See your eye doctor yearly for routine eye exam and screenings for glaucoma and retinal disease.  See your gynecologist yearly if you do not have your female/gynecological exams at our  office.     Specific Concerns:  Call and schedule bone density and mammogram ASAP.     Plan to get Covid vaccine as soon as possible, but get the mammogram now before you get the covid vaccine  Shingles vaccine is also due, but wait a month after Covid vaccine before getting Shingrix.   Shingles vaccine:  I recommend you  have a shingles vaccine to help prevent shingles or herpes zoster outbreak.   Please call your insurer to inquire about coverage for the Shingrix vaccine given in 2 doses.   Some insurers cover this vaccine after age 87, some cover this after age 57.  If your insurer covers this, then call to schedule appointment to have this vaccine here.  You can get the Pneumococcal 23 vaccine sometime this summer.   You have had this prior, but it has been several years.     Expect the Cologuard stool kit in the mail for your hopefully last colon cancer screen     Vaccines:  Stay up to date with your tetanus shots and other required immunizations. You should have a booster for tetanus every 10 years. Be sure to get your flu shot every year, since 5%-20% of the [place] population comes down with the flu. The flu vaccine changes each year, so being vaccinated once is not enough. Get your shot in the fall, before the flu season peaks.   Other vaccines to consider:  Pneumococcal vaccine to protect against certain types of pneumonia.  This is normally recommended for adults age 43 or older.  However, adults younger than 70 years old with certain underlying conditions such as diabetes, heart or lung disease should also receive the vaccine.  Shingles vaccine to protect against Varicella Zoster if you are older than age 81, or younger than 70 years old with certain underlying illness.  If you have not had the Shingrix vaccine, please call your insurer to inquire about coverage for the Shingrix vaccine given in 2 doses.   Some insurers cover this vaccine after age 35, some cover this after age 70.  If your insurer covers this, then call to schedule appointment to have this vaccine here  Hepatitis A vaccine to protect against a form of infection of the liver by a virus acquired from food.  Hepatitis B vaccine to protect against a form of infection of the liver by a virus acquired from blood or body fluids, particularly if  you work in health care.  If you plan to travel internationally, check with your local health department for specific vaccination recommendations.  Human Papilloma Virus or HPV causes cancer of the cervix, and other infections that can be transmitted from person to person. There is a vaccine for HPV, and males should get immunized between the ages of 63 and 24. It requires a series of 3 shots.   Covid/Coronavirus - as the vaccines are becoming available, please consider vaccination if you are a health care worker, first responder, or have significant health problems such as asthma, COPD, heart disease, hypertension, diabetes, obesity, multiple medical problems, over age 35yo, or immunocompromised.       What should I know about Cancer screening? Many types of cancers can be detected early and may often be prevented. Lung Cancer  You should be screened every year for lung cancer if: ? You are a current smoker who has smoked for at least 30 years. ? You are a former smoker who has quit within the past 15  years.  Talk to your health care provider about your screening options, when you should start screening, and how often you should be screened.  Breast cancer screening is essential to preventive care for women. All women age 22 and older should perform a breast self-exam every month. At age 31 and older, women should have their caregiver complete a breast exam each year. Women at ages 72 and older should have a mammogram (x-ray film) of the breasts. Your caregiver can discuss how often you need mammograms.    Breast cancer screening   [facility]   [phone]         [phone] [address], [place], [address] [place], [place] [address]        [place], [place] [address]  Cervical cancer screening includes taking a Pap smear (sample of cells examined under a microscope) from the cervix (end of the uterus).  It also includes testing for HPV (Human Papilloma Virus, which can cause cervical cancer). Screening and a pelvic exam should begin at age 21, or 3 years after a woman becomes sexually active. Screening should occur every year, with a Pap smear but no HPV testing, up to age 58. After age 81, you should have a Pap smear every 3 years with HPV testing, if no HPV was found previously.   Colorectal Cancer  Routine colorectal cancer screening usually begins at 70 years of age and should be repeated every 5-10 years until you are 70 years old. You may need to be screened more often if early forms of precancerous polyps or small growths are found. Your health care provider may recommend screening at an earlier age if you have risk factors for colon cancer.  Your health care provider may recommend using home test kits to check for hidden blood in the stool.  A small camera at the end of a tube can be used to examine your colon (sigmoidoscopy or colonoscopy). This checks for the earliest forms of colorectal cancer.  Skin Cancer  Check your skin from head to toe regularly.  Tell your health care provider about any new moles or changes in moles, especially if: ? There is a change in a mole's size, shape, or color. ? You have a mole that is larger than a pencil eraser.  Always use sunscreen. Apply sunscreen liberally and repeat throughout the day.  Protect yourself by wearing long sleeves, pants, a wide-brimmed hat, and sunglasses when outside.        GENERAL RECOMMENDATIONS FOR GOOD HEALTH:  Healthy diet:  Eat a variety of foods, including fruit, vegetables, animal or vegetable protein, such as meat, fish, chicken, and eggs, or beans, lentils, tofu, and grains, such as rice.  Drink plenty of water daily.  Decrease saturated fat in the diet, avoid lots of red meat, processed foods, sweets, fast foods, and fried foods.  Exercise:  Aerobic exercise helps maintain good heart health. At least  30-40 minutes of moderate-intensity exercise is recommended. For example, a brisk walk that increases your heart rate and breathing. This should be done on most days of the week.   Find a type of exercise or a variety of exercises that you enjoy so that it becomes a part of your daily life.  Examples are running, walking, swimming, water aerobics, and biking.  For motivation and support, explore group exercise such as aerobic class,  spin class, Zumba, Yoga,or  martial arts, etc.    Set exercise goals for yourself, such as a certain weight goal, walk or run in a race such as a 5k walk/run.  Speak to your primary care provider about exercise goals.  Your weight readings per our records: Wt Readings from Last 3 Encounters:  02/23/20 162 lb 9.6 oz (73.8 kg)  06/29/19 154 lb (69.9 kg)  09/21/18 150 lb 3.2 oz (68.1 kg)    Body mass index is 28.8 kg/m.    Disease prevention:  If you smoke or chew tobacco, find out from your caregiver how to quit. It can literally save your life, no matter how long you have been a tobacco user. If you do not use tobacco, never begin.   Maintain a healthy diet and normal weight. Increased weight leads to problems with blood pressure and diabetes.   The Body Mass Index or BMI is a way of measuring how much of your body is fat. Having a BMI above 27 increases the risk of heart disease, diabetes, hypertension, stroke and other problems related to obesity. Your caregiver can help determine your BMI and based on it develop an exercise and dietary program to help you achieve or maintain this important measurement at a healthful level.  High blood pressure causes heart and blood vessel problems.  Persistent high blood pressure should be treated with medicine if weight loss and exercise do not work.  Your blood pressure readings per our records:     BP Readings from Last 3 Encounters:  02/23/20 (!) 142/82  06/29/19 130/70  09/21/18 140/80     Fat and cholesterol  leaves deposits in your arteries that can block them. This causes heart disease and vessel disease elsewhere in your body.  If your cholesterol is found to be high, or if you have heart disease or certain other medical conditions, then you may need to have your cholesterol monitored frequently and be treated with medication.   Ask if you should have a cardiac stress test if your history suggests this. A stress test is a test done on a treadmill that looks for heart disease. This test can find disease prior to there being a problem.      Menopause can be associated with physical symptoms and risks. Hormone replacement therapy is available to decrease these. You should talk to your caregiver about whether starting or continuing to take hormones is right for you.   Osteoporosis is a disease in which the bones lose minerals and strength as we age. This can result in serious bone fractures. Risk of osteoporosis can be identified using a bone density scan. Women ages 8 and over should discuss this with their caregivers, as should women after menopause who have other risk factors. Ask your caregiver whether you should be taking a calcium supplement and Vitamin D, to reduce the rate of osteoporosis.   Osteoporosis screening/bone density testing:  [facility]   [phone]          [phone] [address], [place], [address] [place], [place] [address]        [place], [place] [address]    Avoid drinking alcohol in excess (more than two drinks per day).  Avoid use of street drugs. Do not share needles with anyone. Ask for professional help if you need assistance or instructions on stopping the  use of alcohol, cigarettes, and/or drugs.  Brush your teeth twice a day with fluoride toothpaste, and floss once a day. Good oral hygiene prevents tooth decay and gum disease. The problems can be painful, unattractive, and can cause  other health problems. Visit your dentist for a routine oral and dental check up and preventive care every 6-12 months.   Safety:  Use seatbelts 100% of the time, whether driving or as a passenger.  Use safety devices such as hearing protection if you work in environments with loud noise or significant background noise.  Use safety glasses when doing any work that could send debris in to the eyes.  Use a helmet if you ride a bike or motorcycle.  Use appropriate safety gear for contact sports.  Talk to your caregiver about gun safety.  Use sunscreen with a SPF (or skin protection factor) of 15 or greater.  Lighter skinned people are at a greater risk of skin cancer. Don't forget to also wear sunglasses in order to protect your eyes from too much damaging sunlight. Damaging sunlight can accelerate cataract formation.   Keep carbon monoxide and smoke detectors in your home functioning at all times. Change the batteries every 6 months or use a model that plugs into the wall.    Sexual activity: . Sex is a normal part of life and sexual activity can continue into older adulthood for many healthy people.   . If you are having issues related to sexual activity, please follow up to discuss this further.   . If you are not in a monogamous relationship or have more than one partner, please practice safe sex.  Use condoms. Condoms are used for birth control and to help reduce the spread of sexually transmitted infections (or STIs).  Some of the STIs are gonorrhea (the clap), chlamydia, syphilis, trichomonas, herpes, HPV (human papilloma virus) and HIV (human immunodeficiency virus) which causes AIDS. The herpes, HIV and HPV are viral illnesses that have no cure. These can result in disability, cancer and death.   We are able to test for STIs here at our office.

## 2020-02-24 LAB — COMPREHENSIVE METABOLIC PANEL
ALT: 13 IU/L (ref 0–32)
AST: 22 IU/L (ref 0–40)
Albumin/Globulin Ratio: 1.3 (ref 1.2–2.2)
Albumin: 4.5 g/dL (ref 3.8–4.8)
Alkaline Phosphatase: 84 IU/L (ref 39–117)
BUN/Creatinine Ratio: 15 (ref 12–28)
BUN: 9 mg/dL (ref 8–27)
Bilirubin Total: 0.6 mg/dL (ref 0.0–1.2)
CO2: 26 mmol/L (ref 20–29)
Calcium: 9.1 mg/dL (ref 8.7–10.3)
Chloride: 98 mmol/L (ref 96–106)
Creatinine, Ser: 0.6 mg/dL (ref 0.57–1.00)
GFR calc Af Amer: 108 mL/min/{1.73_m2} (ref >59)
GFR calc non Af Amer: 93 mL/min/{1.73_m2} (ref >59)
Globulin, Total: 3.4 g/dL (ref 1.5–4.5)
Glucose: 91 mg/dL (ref 65–99)
Potassium: 3.7 mmol/L (ref 3.5–5.2)
Sodium: 139 mmol/L (ref 134–144)
Total Protein: 7.9 g/dL (ref 6.0–8.5)

## 2020-02-24 LAB — CBC WITH DIFFERENTIAL/PLATELET
Basophils Absolute: 0.1 10*3/uL (ref 0.0–0.2)
Basos: 1 %
EOS (ABSOLUTE): 0.2 10*3/uL (ref 0.0–0.4)
Eos: 3 %
Hematocrit: 42.9 % (ref 34.0–46.6)
Hemoglobin: 15.1 g/dL (ref 11.1–15.9)
Immature Grans (Abs): 0 10*3/uL (ref 0.0–0.1)
Immature Granulocytes: 0 %
Lymphocytes Absolute: 1.4 10*3/uL (ref 0.7–3.1)
Lymphs: 21 %
MCH: 33.8 pg — ABNORMAL HIGH (ref 26.6–33.0)
MCHC: 35.2 g/dL (ref 31.5–35.7)
MCV: 96 fL (ref 79–97)
Monocytes Absolute: 0.7 10*3/uL (ref 0.1–0.9)
Monocytes: 11 %
Neutrophils Absolute: 4.3 10*3/uL (ref 1.4–7.0)
Neutrophils: 64 %
Platelets: 217 10*3/uL (ref 150–450)
RBC: 4.47 x10E6/uL (ref 3.77–5.28)
RDW: 12.1 % (ref 11.7–15.4)
WBC: 6.7 10*3/uL (ref 3.4–10.8)

## 2020-02-24 LAB — LIPID PANEL
Chol/HDL Ratio: 3.3 ratio (ref 0.0–4.4)
Cholesterol, Total: 201 mg/dL — ABNORMAL HIGH (ref 100–199)
HDL: 61 mg/dL (ref >39)
LDL Chol Calc (NIH): 118 mg/dL — ABNORMAL HIGH (ref 0–99)
Triglycerides: 122 mg/dL (ref 0–149)
VLDL Cholesterol Cal: 22 mg/dL (ref 5–40)

## 2020-02-24 LAB — TSH: TSH: 0.738 u[IU]/mL (ref 0.450–4.500)

## 2020-02-24 LAB — HEMOGLOBIN A1C
Est. average glucose Bld gHb Est-mCnc: 111 mg/dL
Hgb A1c MFr Bld: 5.5 % (ref 4.8–5.6)

## 2020-02-25 ENCOUNTER — Other Ambulatory Visit: Payer: Self-pay | Admitting: Medical

## 2020-02-27 ENCOUNTER — Other Ambulatory Visit: Payer: Self-pay | Admitting: Medical

## 2020-02-27 MED ORDER — HYDROCHLOROTHIAZIDE 25 MG PO TABS: 25.0000 mg | ORAL_TABLET | Freq: Every day | ORAL | 3 refills | Status: DC

## 2020-02-27 MED ORDER — AMLODIPINE BESYLATE 10 MG PO TABS: 10.0000 mg | ORAL_TABLET | Freq: Every day | ORAL | 3 refills | Status: DC

## 2020-02-27 MED ORDER — POTASSIUM CHLORIDE ER 10 MEQ PO TBCR: 10.0000 meq | EXTENDED_RELEASE_TABLET | Freq: Two times a day (BID) | ORAL | 3 refills | Status: DC

## 2020-02-27 MED ORDER — ASPIRIN EC 81 MG PO TBEC: 81.0000 mg | DELAYED_RELEASE_TABLET | Freq: Every day | ORAL | 3 refills | Status: DC

## 2020-04-01 ENCOUNTER — Ambulatory Visit
Admission: RE | Admit: 2020-04-01 | Discharge: 2020-04-01 | Disposition: A | Discharge status: Home / Self Care | Payer: Medicare HMO | Source: Ambulatory Visit | Attending: Medical | Admitting: Medical

## 2020-04-01 ENCOUNTER — Other Ambulatory Visit: Payer: Self-pay

## 2020-04-01 DIAGNOSIS — Z0000 Encounter for general adult medical examination without abnormal findings: Secondary | ICD-10-CM

## 2020-04-01 DIAGNOSIS — Z1231 Encounter for screening mammogram for malignant neoplasm of breast: Secondary | ICD-10-CM | POA: Diagnosis not present

## 2020-04-01 DIAGNOSIS — Z1239 Encounter for other screening for malignant neoplasm of breast: Secondary | ICD-10-CM

## 2020-05-16 ENCOUNTER — Other Ambulatory Visit: Payer: Medicare HMO

## 2020-05-21 ENCOUNTER — Other Ambulatory Visit: Payer: Self-pay

## 2020-05-21 ENCOUNTER — Telehealth (INDEPENDENT_AMBULATORY_CARE_PROVIDER_SITE_OTHER): Payer: Medicare HMO | Admitting: Medical

## 2020-05-21 ENCOUNTER — Encounter: Payer: Self-pay | Admitting: Medical

## 2020-05-21 VITALS — Ht 62.0 in | Wt 160.0 lb

## 2020-05-21 DIAGNOSIS — I809 Phlebitis and thrombophlebitis of unspecified site: Secondary | ICD-10-CM | POA: Diagnosis not present

## 2020-05-21 DIAGNOSIS — I10 Essential (primary) hypertension: Secondary | ICD-10-CM

## 2020-05-21 DIAGNOSIS — R600 Localized edema: Secondary | ICD-10-CM | POA: Insufficient documentation

## 2020-05-21 MED ORDER — AMLODIPINE BESYLATE 10 MG PO TABS: 5.0000 mg | ORAL_TABLET | Freq: Every day | ORAL | 0 refills | Status: DC

## 2020-05-21 MED ORDER — POTASSIUM CHLORIDE ER 10 MEQ PO TBCR: 10.0000 meq | EXTENDED_RELEASE_TABLET | Freq: Two times a day (BID) | ORAL | 3 refills | Status: DC

## 2020-05-21 NOTE — Progress Notes (Signed)
Subjective:     Patient ID: [name], female   DOB: December 21, 1951, 69 y.o.   MRN: [id number]  This visit type was conducted due to national recommendations for restrictions regarding the COVID-19 Pandemic (e.g. social distancing) in an effort to limit this patient's exposure and mitigate transmission in our community.  Due to their co-morbid illnesses, this patient is at least at moderate risk for complications without adequate follow up.  This format is felt to be most appropriate for this patient at this time.    Documentation for virtual audio and video telecommunications through Zoom encounter:  The patient was located at home. The provider was located in the office. The patient did consent to this visit and is aware of possible charges through their insurance for this visit.  The other persons participating in this telemedicine service were none. Time spent on call was 20 minutes and in review of previous records 20 minutes total.  This virtual service is not related to other E/M service within previous 7 days.   HPI Chief Complaint  Patient presents with  . Joint Swelling    with pressure bilateral-left worse    Virtual consult today regarding swelling in the feet.  Her toes feel tight due to the swelling.  No erythema or signs of infection, no warmth, no drainage.  We discussed this back in February.  This has not changed a whole lot.  Is worse as the day goes on.  She is taking amlodipine daily, been on this for years.  She does not take the hydrochlorothiazide every day but when she does she takes it in the evening although it makes her get up to use the bathroom.  She denies excess salt intake.  She is doing some exercise with walking.  She does not use compression hose as we discussed last.  No urinary change, no dark urine, no chest pain or shortness of breath.  No recent weight change  Past Medical History:  Diagnosis Date  . Allergic rhinitis, cause unspecified   .  Arthritis   . Chewing tobacco use   . Coronary artery disease, non-occlusive December 2006  . Dyslipidemia   . Essential hypertension    previously uncontrolled, now well-controlled; no evidence of renal artery stenosis  . GERD (gastroesophageal reflux disease)   . H/O echocardiogram 12/2014   60-65 % EF, mild basal hypertrophy focally at septum, otherwise normal  . Heart murmur 12/2014   mild focal hypertrophy of septum, EF 60-65%, trivial aortic regurgitation  . History of cardiac catheterization December 2006   for exertional chest pain: Nonobstructive CAD  . History of cardiovascular stress test 12/2014   perfusion study; low risk study.  LV Wall Motion: NL LV Function; NL Wall Motion; LVEF 63%.  Dr. Debara Pickett  . History of migraine headaches    not an issues as of 07/2017  . Onychomycosis 05/2015  . Pneumococcal vaccine refused 05/2015   Current Outpatient Medications on File Prior to Visit  Medication Sig Dispense Refill  . aspirin EC 81 MG tablet Take 1 tablet (81 mg total) by mouth daily. 90 tablet 3  . hydrochlorothiazide (HYDRODIURIL) 25 MG tablet Take 1 tablet (25 mg total) by mouth daily. 90 tablet 3  . meclizine (ANTIVERT) 25 MG tablet Take 1 tablet (25 mg total) by mouth 2 (two) times daily. (Patient not taking: Reported on 02/23/2020) 30 tablet 0   No current facility-administered medications on file prior to visit.     Review of  Systems As in subjective    Objective:   Physical Exam  Ht 5\' 2"  (1.575 m)   Wt 160 lb (72.6 kg)   BMI 29.26 kg/m   Due to coronavirus pandemic stay at home measures, patient visit was virtual and they were not examined in person.   Skin unremarkable, no obvious swelling on video feed, no obvious pitting     Assessment:     Encounter Diagnoses  Name Primary?  . Lower extremity edema Yes  . Essential hypertension, benign   . Thrombophlebitis        Plan:     We discussed the limitations of virtual consult.  We discussed her  concerns.  There has not been any major change since February.  We reviewed the ultrasound from February that ruled out DVT that showed superficial thrombophlebitis.  She will continue aspirin daily.  We will cut amlodipine down to 5 mg daily for now in case this is contributing to the swelling.  I advise she go ahead and use her hydrochlorothiazide every day in the morning instead of as needed nighttime use.  Continue Klor-Con potassium.  I asked her to wear compression hose and get exercise daily.  Avoid added salt in the diet  Follow-up in 2 weeks by phone or MyChart  If not improving, consider vein surgery consult  There are no diagnoses linked to this encounter.

## 2020-07-25 ENCOUNTER — Other Ambulatory Visit: Payer: Medicare HMO

## 2020-09-11 ENCOUNTER — Encounter: Payer: Self-pay | Admitting: Medical

## 2020-09-11 ENCOUNTER — Other Ambulatory Visit: Payer: Self-pay

## 2020-09-11 ENCOUNTER — Ambulatory Visit (INDEPENDENT_AMBULATORY_CARE_PROVIDER_SITE_OTHER): Payer: Medicare HMO | Admitting: Medical

## 2020-09-11 VITALS — BP 170/82 | HR 86 | Ht 62.0 in | Wt 168.8 lb

## 2020-09-11 DIAGNOSIS — Z720 Tobacco use: Secondary | ICD-10-CM

## 2020-09-11 DIAGNOSIS — R251 Tremor, unspecified: Secondary | ICD-10-CM | POA: Diagnosis not present

## 2020-09-11 DIAGNOSIS — R0600 Dyspnea, unspecified: Secondary | ICD-10-CM

## 2020-09-11 DIAGNOSIS — R7301 Impaired fasting glucose: Secondary | ICD-10-CM | POA: Diagnosis not present

## 2020-09-11 DIAGNOSIS — R0683 Snoring: Secondary | ICD-10-CM | POA: Diagnosis not present

## 2020-09-11 DIAGNOSIS — R600 Localized edema: Secondary | ICD-10-CM

## 2020-09-11 DIAGNOSIS — R0989 Other specified symptoms and signs involving the circulatory and respiratory systems: Secondary | ICD-10-CM

## 2020-09-11 DIAGNOSIS — Z8249 Family history of ischemic heart disease and other diseases of the circulatory system: Secondary | ICD-10-CM

## 2020-09-11 DIAGNOSIS — I2510 Atherosclerotic heart disease of native coronary artery without angina pectoris: Secondary | ICD-10-CM

## 2020-09-11 DIAGNOSIS — I10 Essential (primary) hypertension: Secondary | ICD-10-CM

## 2020-09-11 DIAGNOSIS — R29898 Other symptoms and signs involving the musculoskeletal system: Secondary | ICD-10-CM

## 2020-09-11 DIAGNOSIS — R0609 Other forms of dyspnea: Secondary | ICD-10-CM | POA: Insufficient documentation

## 2020-09-11 DIAGNOSIS — E785 Hyperlipidemia, unspecified: Secondary | ICD-10-CM

## 2020-09-11 NOTE — Progress Notes (Addendum)
Subjective: Chief Complaint  Patient presents with  . Leg Pain    with leg weakness   Here for leg weakness x 2 months.  When she starts to walk, both legs feel weak.  Has fallen twice recently.  Legs gave out when walking.  Legs are shaky.   Been having this issue for a few months.  Also has ongoing swelling in both legs.  Gets out of breath with activity x last month.  No chest pain, but DOE, and SOB at times.    Checks BP at home, usually 140/70-80s.  Is so tired all the time, fatigued.   Nerves tore up, anxious.  Stays cold often.  Not currently on statin.  She notes in the past she will take medication for a while, do better with diet, then stop the statin.  No leg numbness, no tingling.    No incontinence, fever or saddle anaesthesia.     No prior sleep apnea test.  Snores. No witnessed apnea.   lately not awakening rested.  She has daytime somnolence.     No other aggravating or relieving factors. No other complaint.    Past Medical History:  Diagnosis Date  . Allergic rhinitis, cause unspecified   . Arthritis   . Chewing tobacco use   . Coronary artery disease, non-occlusive December 2006  . Dyslipidemia   . Essential hypertension    previously uncontrolled, now well-controlled; no evidence of renal artery stenosis  . GERD (gastroesophageal reflux disease)   . H/O echocardiogram 12/2014   60-65 % EF, mild basal hypertrophy focally at septum, otherwise normal  . Heart murmur 12/2014   mild focal hypertrophy of septum, EF 60-65%, trivial aortic regurgitation  . History of cardiac catheterization December 2006   for exertional chest pain: Nonobstructive CAD  . History of cardiovascular stress test 12/2014   perfusion study; low risk study.  LV Wall Motion: NL LV Function; NL Wall Motion; LVEF 63%.  Dr. Rennis Golden  . History of migraine headaches    not an issues as of 07/2017  . Onychomycosis 05/2015  . Pneumococcal vaccine refused 05/2015   Current Outpatient Medications on  File Prior to Visit  Medication Sig Dispense Refill  . amLODipine (NORVASC) 10 MG tablet Take 0.5 tablets (5 mg total) by mouth daily. 90 tablet 0  . aspirin EC 81 MG tablet Take 1 tablet (81 mg total) by mouth daily. 90 tablet 3  . hydrochlorothiazide (HYDRODIURIL) 25 MG tablet Take 1 tablet (25 mg total) by mouth daily. 90 tablet 3  . potassium chloride (KLOR-CON) 10 MEQ tablet Take 1 tablet (10 mEq total) by mouth 2 (two) times daily. 180 tablet 3   No current facility-administered medications on file prior to visit.   ROS as in subjective    Objective: BP (!) 170/82   Pulse 86   Ht 5\' 2"  (1.575 m)   Wt 168 lb 12.8 oz (76.6 kg)   SpO2 95%   BMI 30.87 kg/m   BP Readings from Last 3 Encounters:  09/11/20 (!) 170/82  02/23/20 (!) 142/82  06/29/19 130/70   Wt Readings from Last 3 Encounters:  09/11/20 168 lb 12.8 oz (76.6 kg)  05/21/20 160 lb (72.6 kg)  02/23/20 162 lb 9.6 oz (73.8 kg)   Gen: wd, wn, tearful at times, seems anxious or upset about her health Skin unremarkable Lungs somewhat decreased sounds, no crackles, no wheezes Heart: 2-3/6 mid systolic murmur heard in upper chest, otherwise RRR Ext: 1+ pitting  bilat LE edema decreased pedal pulses bilat, no clubbing or cyanosis Neuro: no obvious resting tremor.  When standing and walking seems unsteady and legs shaking visibly while standing and attempting to walk strength of legs 4/5 bilat, DTRs 1+, sensation normal, otherwise CN 2-12 intact  EKG Rate 73 bpm, PR 130 ms, QRS 82 ms, QTC 458 ms, Axis 20 degrees, normal sinus rhythm, nonspecific ST abnormality,  Assessment: Encounter Diagnoses  Name Primary?  . Leg weakness, bilateral Yes  . Shaking   . DOE (dyspnea on exertion)   . Snoring   . Essential hypertension, benign   . Coronary artery disease involving native heart without angina pectoris, unspecified vessel or lesion type   . Impaired fasting blood sugar   . Family history of premature CAD   . Chewing  tobacco use   . Dyslipidemia   . Lower extremity edema   . Decreased pedal pulses      Plan: EKG reviewed  Consider CHF, worsening CAD, electrolyte imbalance, possible OSA, PAD.    Given symptoms we will pursue labs, CXR, referral back to Dr. Harding/cardiology  She will need ABIs, which can be done either through cardiology or outpatient per our order  I recommend sleep study as well   Sheronica was seen today for leg pain.  Diagnoses and all orders for this visit:  Leg weakness, bilateral -     Comprehensive metabolic panel -     CBC -     Brain natriuretic peptide -     TSH -     EKG 12-Lead  Shaking -     Comprehensive metabolic panel -     CBC -     Brain natriuretic peptide -     TSH  DOE (dyspnea on exertion) -     Comprehensive metabolic panel -     CBC -     Brain natriuretic peptide -     TSH -     DG Chest 2 View; Future -     Pulse oximetry (single); Future -     EKG 12-Lead  Snoring  Essential hypertension, benign  Coronary artery disease involving native heart without angina pectoris, unspecified vessel or lesion type  Impaired fasting blood sugar -     Hemoglobin A1c  Family history of premature CAD  Chewing tobacco use  Dyslipidemia  Lower extremity edema -     DG Chest 2 View; Future -     Pulse oximetry (single); Future  Decreased pedal pulses -     DG Chest 2 View; Future -     Pulse oximetry (single); Future   Pending labs, CXR.

## 2020-09-11 NOTE — Progress Notes (Signed)
Referral has been placed. 

## 2020-09-12 ENCOUNTER — Other Ambulatory Visit: Payer: Self-pay | Admitting: Medical

## 2020-09-12 ENCOUNTER — Encounter: Payer: Self-pay | Admitting: Medical

## 2020-09-12 ENCOUNTER — Ambulatory Visit
Admission: RE | Admit: 2020-09-12 | Discharge: 2020-09-12 | Disposition: A | Discharge status: Home / Self Care | Payer: Medicare HMO | Source: Ambulatory Visit | Attending: Medical | Admitting: Medical

## 2020-09-12 DIAGNOSIS — R600 Localized edema: Secondary | ICD-10-CM

## 2020-09-12 DIAGNOSIS — R0609 Other forms of dyspnea: Secondary | ICD-10-CM

## 2020-09-12 DIAGNOSIS — R0600 Dyspnea, unspecified: Secondary | ICD-10-CM

## 2020-09-12 DIAGNOSIS — R0989 Other specified symptoms and signs involving the circulatory and respiratory systems: Secondary | ICD-10-CM

## 2020-09-12 DIAGNOSIS — R0602 Shortness of breath: Secondary | ICD-10-CM | POA: Diagnosis not present

## 2020-09-12 LAB — CBC
Hematocrit: 42 % (ref 34.0–46.6)
Hemoglobin: 14.8 g/dL (ref 11.1–15.9)
MCH: 33.1 pg — ABNORMAL HIGH (ref 26.6–33.0)
MCHC: 35.2 g/dL (ref 31.5–35.7)
MCV: 94 fL (ref 79–97)
Platelets: 202 10*3/uL (ref 150–450)
RBC: 4.47 x10E6/uL (ref 3.77–5.28)
RDW: 12.7 % (ref 11.7–15.4)
WBC: 7.3 10*3/uL (ref 3.4–10.8)

## 2020-09-12 LAB — BRAIN NATRIURETIC PEPTIDE: BNP: 23 pg/mL (ref 0.0–100.0)

## 2020-09-12 LAB — COMPREHENSIVE METABOLIC PANEL
ALT: 24 IU/L (ref 0–32)
AST: 36 IU/L (ref 0–40)
Albumin/Globulin Ratio: 1.2 (ref 1.2–2.2)
Albumin: 4.4 g/dL (ref 3.8–4.8)
Alkaline Phosphatase: 74 IU/L (ref 44–121)
BUN/Creatinine Ratio: 14 (ref 12–28)
BUN: 8 mg/dL (ref 8–27)
Bilirubin Total: 0.7 mg/dL (ref 0.0–1.2)
CO2: 26 mmol/L (ref 20–29)
Calcium: 9.8 mg/dL (ref 8.7–10.3)
Chloride: 94 mmol/L — ABNORMAL LOW (ref 96–106)
Creatinine, Ser: 0.56 mg/dL — ABNORMAL LOW (ref 0.57–1.00)
GFR calc Af Amer: 110 mL/min/{1.73_m2} (ref >59)
GFR calc non Af Amer: 95 mL/min/{1.73_m2} (ref >59)
Globulin, Total: 3.6 g/dL (ref 1.5–4.5)
Glucose: 92 mg/dL (ref 65–99)
Potassium: 3.3 mmol/L — ABNORMAL LOW (ref 3.5–5.2)
Sodium: 137 mmol/L (ref 134–144)
Total Protein: 8 g/dL (ref 6.0–8.5)

## 2020-09-12 LAB — HEMOGLOBIN A1C
Est. average glucose Bld gHb Est-mCnc: 117 mg/dL
Hgb A1c MFr Bld: 5.7 % — ABNORMAL HIGH (ref 4.8–5.6)

## 2020-09-12 LAB — TSH: TSH: 1.02 u[IU]/mL (ref 0.450–4.500)

## 2020-09-12 MED ORDER — AMLODIPINE BESYLATE 5 MG PO TABS: 5.0000 mg | ORAL_TABLET | Freq: Every day | ORAL | 0 refills | Status: DC

## 2020-09-12 MED ORDER — POTASSIUM CHLORIDE CRYS ER 20 MEQ PO TBCR: 20.0000 meq | EXTENDED_RELEASE_TABLET | Freq: Two times a day (BID) | ORAL | 1 refills | Status: DC

## 2020-09-12 NOTE — Addendum Note (Signed)
Addended by: Jac Canavan on: 09/12/2020 10:39 AM   Modules accepted: Orders

## 2020-09-19 ENCOUNTER — Encounter: Payer: Self-pay | Admitting: Cardiology

## 2020-09-19 ENCOUNTER — Other Ambulatory Visit: Payer: Self-pay

## 2020-09-19 ENCOUNTER — Ambulatory Visit: Payer: Medicare HMO | Admitting: Cardiology

## 2020-09-19 VITALS — BP 158/82 | HR 63 | Ht 62.0 in | Wt 167.0 lb

## 2020-09-19 DIAGNOSIS — Z8249 Family history of ischemic heart disease and other diseases of the circulatory system: Secondary | ICD-10-CM | POA: Diagnosis not present

## 2020-09-19 DIAGNOSIS — I2510 Atherosclerotic heart disease of native coronary artery without angina pectoris: Secondary | ICD-10-CM | POA: Diagnosis not present

## 2020-09-19 DIAGNOSIS — I10 Essential (primary) hypertension: Secondary | ICD-10-CM

## 2020-09-19 DIAGNOSIS — R0600 Dyspnea, unspecified: Secondary | ICD-10-CM | POA: Diagnosis not present

## 2020-09-19 DIAGNOSIS — M7989 Other specified soft tissue disorders: Secondary | ICD-10-CM | POA: Diagnosis not present

## 2020-09-19 DIAGNOSIS — E785 Hyperlipidemia, unspecified: Secondary | ICD-10-CM | POA: Diagnosis not present

## 2020-09-19 DIAGNOSIS — R0609 Other forms of dyspnea: Secondary | ICD-10-CM

## 2020-09-19 MED ORDER — VALSARTAN 80 MG PO TABS: 80.0000 mg | ORAL_TABLET | Freq: Every day | ORAL | 3 refills | Status: DC

## 2020-09-19 MED ORDER — VALSARTAN 80 MG PO TABS
80.0000 mg | ORAL_TABLET | Freq: Every day | ORAL | 3 refills | Status: DC
Start: 2020-09-19 — End: 2021-10-23

## 2020-09-19 NOTE — Patient Instructions (Signed)
Medication Instructions:  BEGIN VALSARTAN 80 MG. TAKE IN THE MORNINGS WITH YOUR HCTZ.  TAKE YOUR AMLODIPINE AT NIGHT  *If you need a refill on your cardiac medications before your next appointment, please call your pharmacy*    Testing/Procedures: CARDIAC CALCIUM SCORE AT [address]   Follow-Up: At [facility], you and your health needs are our priority.  As part of our continuing mission to provide you with exceptional heart care, we have created designated Provider Care Teams.  These Care Teams include your primary Cardiologist (physician) and Advanced Practice Providers (APPs -  Physician Assistants and Nurse Practitioners) who all work together to provide you with the care you need, when you need it.  We recommend signing up for the patient portal called "MyChart".  Sign up information is provided on this After Visit Summary.  MyChart is used to connect with patients for Virtual Visits (Telemedicine).  Patients are able to view lab/test results, encounter notes, upcoming appointments, etc.  Non-urgent messages can be sent to your provider as well.   To learn more about what you can do with MyChart, go to [email or web address].    Your next appointment:   2 month(s)  The format for your next appointment:   In Person  Provider:   Bryan Lemma, MD   Other Instructions OKAY TO WEAR SUPPORT HOSE ON LEFT LEG FOR SWELLING  OKAY TO WALK ON TREADMILL, BUT USE CANE AT ALL OTHER TIMES

## 2020-09-19 NOTE — Progress Notes (Signed)
Primary Care Provider: Jac Canavan, PA-C Cardiologist: No primary care provider on file. Electrophysiologist: None  Clinic Note: Chief Complaint  Patient presents with  . New Patient (Initial Visit)    5-year follow-up  . Leg Swelling  . Hypertension    HPI:    [name] is a 69 y.o. female with a PMH below who is being seen today for the evaluation of HYPERTENSION AND LEG SWELLING at the request of Jac Canavan, PA-C.  [name] was last seen in January 2016 as a 5-year cardiology follow-up.  Prior to that she had an echocardiogram to evaluate her for possible cardiomyopathy.  At that time she was found to have very difficult control hypertension. -> Evaluation included renal angiography with cardiac catheterization showing no significant disease and no RAS. ->  We ordered a echocardiogram and Myoview to evaluate chest discomfort and exertional dyspnea along with PND and orthopnea.  Following a relatively normal evaluation, she was lost to follow-up.  [name] was just seen on 915 2021 by Crosby Oyster with complaints of her legs feeling weak and tired.  Legs give out when walking.  They are shaking and swelling.  Ongoing swelling for least a month.  She is also noted to getting out of breath with activity for a month.  No chest tightness simply dyspnea.  No resting dyspnea.  Also blood pressures usually at home ranging from 140/70-80 mmHg.  Feeling tired all the time fatigue.  "Very anxious.".  Not on a statin.  No incontinence. -> BP was 170/80 mmHg.  Blood work checked including BMP, TSH and CMP/CBC.  Chest x-ray ordered. _>  BNP was only 23.  Renal function was stable.  CBC normal.  A1c 5.3.  TSH normal.  Recent Hospitalizations: n.a  Reviewed  CV studies:    The following studies were reviewed today: (if available, images/films reviewed: From Epic Chart or Care Everywhere) . Jan 2016 - Echo & Myoview:   o Echo: EF 60 to 65%.  Normal wall  motion and no valvular lesions.  Normal LV filling pressures. o Myoview: LOW RISK.  EF 63%.  Fixed Small size mild intensity distal anteroapical defect suggestive of breast attenuation.  No reversibility. = Breast attenuation.  NO INFARCT OR ISCHEMIA.  Left Lower Extremity Venous Dopplers 02/23/2020: Contralateral common femoral vein normal with no DVT.  Left: No DVT.  Age-indeterminate occlusive superficial thrombophlebitis involving the greater saphenous vein of the distal left calf.  Interval History:   [name] is here today again with multiple different symptoms.  She really has not been very active at all since Covid.  She has been very immobilized and deconditioned.  She notes several issues:  Generalized leg weakness and swelling.  Shaky  Legs give out on him when walking.  No chest pain but some exertional dyspnea  Weight gain of roughly 20 pounds due to lack of activity.  Persistent hypertension-blood pressures usually at home range from 140 to 155 mm systolic.  2 falls.  She notes that her left leg is has more edema than the right and is tired and weak.  She was supposed be wearing support stockings but has not done so. She patient has generalized fatigue with some exertional dyspnea.  This seems to be relatively stable to the symptoms that she has had on several previous evaluations. No chest pain or pressure with rest or exertion.  No PND orthopnea despite having edema.   CV Review  of Symptoms (Summary): positive for - dyspnea on exertion, edema and Fatigue and generalized weakness, leg weakness negative for - chest pain, irregular heartbeat, orthopnea, palpitations, paroxysmal nocturnal dyspnea, rapid heart rate or shortness of breath  The patient does not have symptoms concerning for COVID-19 infection (fever, chills, cough, or new shortness of breath).   REVIEWED OF SYSTEMS   Review of Systems  Constitutional: Positive for malaise/fatigue. Negative for weight  loss (20 pound weight gain).  HENT: Negative for congestion and nosebleeds.   Respiratory: Positive for cough and shortness of breath (With exertion).   Cardiovascular: Positive for leg swelling (Left greater than right below knee). Negative for palpitations.  Gastrointestinal: Positive for nausea. Negative for blood in stool, diarrhea and melena.  Genitourinary: Negative for hematuria.  Musculoskeletal: Positive for joint pain. Negative for back pain and myalgias.  Neurological: Positive for focal weakness (Left greater than right leg weakness).  Psychiatric/Behavioral: Negative for depression (Does not complain of depression, but does seem to have relatively significant anhedonia and change in sleep and diet habits.). The patient is nervous/anxious.     I have reviewed and (if needed) personally updated the patient's problem list, medications, allergies, past medical and surgical history, social and family history.   PAST MEDICAL HISTORY   Past Medical History:  Diagnosis Date  . Allergic rhinitis, cause unspecified   . Arthritis   . Chewing tobacco use   . Coronary artery disease, non-occlusive December 2006  . Dyslipidemia   . Essential hypertension    previously uncontrolled, now well-controlled; no evidence of renal artery stenosis  . GERD (gastroesophageal reflux disease)   . H/O echocardiogram 12/2014   60-65 % EF, mild basal hypertrophy focally at septum, otherwise normal  . Heart murmur 12/2014   mild focal hypertrophy of septum, EF 60-65%, trivial aortic regurgitation  . History of cardiac catheterization December 2006   for exertional chest pain: Nonobstructive CAD  . History of migraine headaches    not an issues as of 07/2017  . Onychomycosis 05/2015  . Pneumococcal vaccine refused 05/2015    PAST SURGICAL HISTORY   Past Surgical History:  Procedure Laterality Date  . ABDOMINAL HYSTERECTOMY     PARTIAL (benign, for bleeding), still has ovaries  . CAROTID ANGIOGRAM   2005  . COLONOSCOPY  2009   Dr. Loreta Ave  . ESOPHAGOGASTRODUODENOSCOPY  2009   Dr. Loreta Ave  . EYE SURGERY     new lenses 07/2017  . LAPAROSCOPY  12/02   ovarian cyst  . LEFT HEART CATH AND CORONARY ANGIOGRAPHY  12/06   Dr. Clarene Duke; no significant CAD. Normal LV function with EF of 60-65%. Also noted no RAS.  Marland Kitchen NM MYOVIEW LTD  12/2014   LOW RISK.  EF 63%.  Fixed Small size mild intensity distal anteroapical defect suggestive of breast attenuation.  No reversibility. = Breast attenuation.  NO INFARCT OR ISCHEMIA.  Marland Kitchen TRANSTHORACIC ECHOCARDIOGRAM  12/2014   EF 60 to 65%.  Normal wall motion and no valvular lesions.  Normal LV filling pressures.    Immunization History  Administered Date(s) Administered  . Influenza, High Dose Seasonal PF 01/19/2017, 11/22/2019  . Influenza-Unspecified 11/22/2019  . Pneumococcal Conjugate-13 01/19/2017  . Pneumococcal Polysaccharide-23 11/04/2005  . Td 11/04/2005  . Tdap 09/21/2011, 08/01/2016    MEDICATIONS/ALLERGIES   No outpatient medications have been marked as taking for the 09/19/20 encounter (Office Visit) with Marykay Lex, MD.    Allergies  Allergen Reactions  . Morphine And Related Other (See  Comments)    Palpitations and shaking.    SOCIAL HISTORY/FAMILY HISTORY   Reviewed in Epic:  Pertinent findings:  Social History   Tobacco Use  . Smoking status: Never Smoker  . Smokeless tobacco: Current User    Types: Snuff  . Tobacco comment: hhas been using since she was a teenager  Vaping Use  . Vaping Use: Never used  Substance Use Topics  . Alcohol use: Yes    Alcohol/week: 30.0 standard drinks    Types: 30 Cans of beer per week    Comment: 12 drinks per week.  . Drug use: No   Social History   Social History Narrative   Widowed 2016.  Has new boyfriend as of 10/2015, just started riding motorcycle, trying to get new enjoyment in life since being widowed.        Was married 43 years with Deeann Cree.       Has been dipping  snuff since she was a teenager.      She drinks 2-3 Sherilyn Dacosta Beers most days out of the week      She "does not do as much exercise as she should, walking maybe 1 day week.   Family History  Problem Relation Age of Onset  . Heart disease Mother        CHF  . Hypertension Mother   . Heart disease Father 37       MI  . Heart disease Brother   . Heart disease Maternal Aunt   . Diabetes Maternal Aunt   . Cancer Maternal Aunt        cervical  . Heart disease Maternal Uncle   . Diabetes Maternal Uncle   . Heart disease Maternal Grandmother        CHF  . Diabetes Maternal Grandmother   . Heart disease Paternal Uncle   . Heart disease Brother   . Heart disease Brother     OBJCTIVE -PE, EKG, labs   Wt Readings from Last 3 Encounters:  09/19/20 167 lb (75.8 kg)  09/11/20 168 lb 12.8 oz (76.6 kg)  05/21/20 160 lb (72.6 kg)    Physical Exam: BP (!) 158/82   Pulse 63   Ht 5\' 2"  (1.575 m)   Wt 167 lb (75.8 kg)   SpO2 93%   BMI 30.54 kg/m  Physical Exam Vitals reviewed.  Constitutional:      General: She is not in acute distress.    Appearance: Normal appearance. She is obese. She is ill-appearing (Chronically). She is not toxic-appearing.  HENT:     Head: Normocephalic and atraumatic.  Neck:     Vascular: No carotid bruit, hepatojugular reflux or JVD.  Cardiovascular:     Rate and Rhythm: Normal rate and regular rhythm.     Pulses: Normal pulses.     Heart sounds: Normal heart sounds. No murmur heard.  No friction rub. No gallop.   Pulmonary:     Effort: Pulmonary effort is normal. No respiratory distress.     Breath sounds: Normal breath sounds.  Chest:     Chest wall: Tenderness (Left lateral chest wall) present.  Abdominal:     General: Abdomen is flat. Bowel sounds are normal. There is no distension.     Palpations: Abdomen is soft. There is no mass (No HSM).  Musculoskeletal:        General: Normal range of motion.     Cervical back: Normal range of  motion.     Right  lower leg: Edema (Trace-1+) present.     Left lower leg: Edema (2+) present.  Neurological:     General: No focal deficit present.     Mental Status: She is alert and oriented to person, place, and time.  Psychiatric:        Behavior: Behavior normal.        Thought Content: Thought content normal.        Judgment: Judgment normal.     Comments: Somewhat depressed still mood     Adult ECG Report 916 2020-from PCP: Rate: 73 bpm;  Rhythm: normal sinus rhythm and Nonspecific ST and T wave changes with T wave inversions in the 1.  Otherwise normal EKG.;   Narrative Interpretation: Stable EKG.  Recent Labs:    Lab Results  Component Value Date   CHOL 201 (H) 02/23/2020   HDL 61 02/23/2020   LDLCALC 118 (H) 02/23/2020   TRIG 122 02/23/2020   CHOLHDL 3.3 02/23/2020   Lab Results  Component Value Date   CREATININE 0.56 (L) 09/11/2020   BUN 8 09/11/2020   NA 137 09/11/2020   K 3.3 (L) 09/11/2020   CL 94 (L) 09/11/2020   CO2 26 09/11/2020   Lab Results  Component Value Date   TSH 1.020 09/11/2020    ASSESSMENT/PLAN    Problem List Items Addressed This Visit    Essential hypertension, benign - Primary (Chronic)    Very poorly controlled hypertension.  Not really on much medications she is on amlodipine with HCTZ.  HCTZ is helping edema which is probably related to venous stasis.  Plan to add losartan as she has had issues with hyponatremia and hypokalemia.  I suspect hypokalemia is related to HCTZ.      Relevant Medications   valsartan (DIOVAN) 80 MG tablet   Family history of premature CAD (Chronic)    Has had cardiac catheterization and stress test all of which were negative.  For further stratification would recommend coronary calcium score evaluation in the future to determine need for aggressive lipid management.      Relevant Orders   CT CARDIAC SCORING   Dyslipidemia (Chronic)    LDL is 118 with cholesterol 201.  Given her risk factors of  hypertension and family history, probably would not be more aggressive and treat in the future.  We will continue to treat blood pressure and order a coronary calcium score.      Coronary artery disease, non-occlusive (Chronic)    Previous evaluations have shown no significant CAD.  She not having any anginal symptoms, just some exertional dyspnea.  Likely related to deconditioning.  Next   Plan for now continue improved blood pressure control.  She is only on amlodipine and HCTZ.  She says edema is better since starting HCTZ, but her sodium and potassium have been low.  Plan: We will add valsartan 80 mg daily which will also help with hyperkalemia. Not currently on statin, in future we will evaluate coronary calcium score to determine her risk stratification.      Relevant Medications   valsartan (DIOVAN) 80 MG tablet   DOE (dyspnea on exertion) (Chronic)    Probably deconditioning related.  Has had plain evaluations in the past.  For now would treat blood pressure and see how she does when she gets back walking.      Relevant Orders   CT CARDIAC SCORING   Left leg swelling    Left greater than right leg swelling.  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  No DVT is being seen at that time, but probably has significant venous stasis.  Recommend support stockings, foot elevation.  HCTZ helping some, but is problematic with hypokalemia. Exam with hypokalemia, we will add ARB          COVID-19 Education: The signs and symptoms of COVID-19 were discussed with the patient and how to seek care for testing (follow up with PCP or arrange E-visit).   The importance of social distancing and COVID-19 vaccination was discussed today. The patient is practicing social distancing & Masking.   I spent a total of with the patient spent in direct patient consultation.  Additional time spent with chart review  / charting (studies, outside  notes, etc): 15 Total Time: 42 min   Current medicines are reviewed at length with the patient today.  (+/- concerns) n/a  Notice: This dictation was prepared with Dragon dictation along with smaller phrase technology. Any transcriptional errors that result from this process are unintentional and may not be corrected upon review.  Patient Instructions / Medication Changes & Studies & Tests Ordered   Patient Instructions  Medication Instructions:  BEGIN VALSARTAN 80 MG. TAKE IN THE MORNINGS WITH YOUR HCTZ.  TAKE YOUR AMLODIPINE AT NIGHT  *If you need a refill on your cardiac medications before your next appointment, please call your pharmacy*    Testing/Procedures: CARDIAC CALCIUM SCORE AT [address]   Follow-Up: At [facility], you and your health needs are our priority.  As part of our continuing mission to provide you with exceptional heart care, we have created designated Provider Care Teams.  These Care Teams include your primary Cardiologist (physician) and Advanced Practice Providers (APPs -  Physician Assistants and Nurse Practitioners) who all work together to provide you with the care you need, when you need it.  We recommend signing up for the patient portal called "MyChart".  Sign up information is provided on this After Visit Summary.  MyChart is used to connect with patients for Virtual Visits (Telemedicine).  Patients are able to view lab/test results, encounter notes, upcoming appointments, etc.  Non-urgent messages can be sent to your provider as well.   To learn more about what you can do with MyChart, go to [email or web address].    Your next appointment:   2 month(s)  The format for your next appointment:   In Person  Provider:   Bryan Lemma, MD   Other Instructions OKAY TO WEAR SUPPORT HOSE ON LEFT LEG FOR SWELLING  OKAY TO WALK ON TREADMILL, BUT USE CANE AT ALL OTHER TIMES     Studies Ordered:   Orders Placed This Encounter    Procedures  . CT CARDIAC SCORING     Bryan Lemma, M.D., M.S. Interventional Cardiologist   Pager # [phone] Phone # [phone] [address] [place], [place] [address]   Thank you for choosing Heartcare at [facility]!!

## 2020-09-20 ENCOUNTER — Encounter: Payer: Self-pay | Admitting: Cardiology

## 2020-09-20 NOTE — Assessment & Plan Note (Addendum)
LDL is 118 with cholesterol 201.  Given her risk factors of hypertension and family history, probably would not be more aggressive and treat in the future.  We will continue to treat blood pressure and order a coronary calcium score.

## 2020-09-20 NOTE — Assessment & Plan Note (Signed)
Left greater than right leg swelling.  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  No DVT is being seen at that time, but probably has significant venous stasis.  Recommend support stockings, foot elevation.  HCTZ helping some, but is problematic with hypokalemia. Exam with hypokalemia, we will add ARB

## 2020-09-20 NOTE — Assessment & Plan Note (Signed)
Very poorly controlled hypertension.  Not really on much medications she is on amlodipine with HCTZ.  HCTZ is helping edema which is probably related to venous stasis.  Plan to add losartan as she has had issues with hyponatremia and hypokalemia.  I suspect hypokalemia is related to HCTZ.

## 2020-09-20 NOTE — Assessment & Plan Note (Signed)
Probably deconditioning related.  Has had plain evaluations in the past.  For now would treat blood pressure and see how she does when she gets back walking.

## 2020-09-20 NOTE — Assessment & Plan Note (Signed)
Has had cardiac catheterization and stress test all of which were negative.  For further stratification would recommend coronary calcium score evaluation in the future to determine need for aggressive lipid management.

## 2020-09-20 NOTE — Assessment & Plan Note (Signed)
Previous evaluations have shown no significant CAD.  She not having any anginal symptoms, just some exertional dyspnea.  Likely related to deconditioning.  Next   Plan for now continue improved blood pressure control.  She is only on amlodipine and HCTZ.  She says edema is better since starting HCTZ, but her sodium and potassium have been low.  Plan: We will add valsartan 80 mg daily which will also help with hyperkalemia. Not currently on statin, in future we will evaluate coronary calcium score to determine her risk stratification.

## 2020-10-02 ENCOUNTER — Other Ambulatory Visit: Payer: Self-pay

## 2020-10-02 ENCOUNTER — Ambulatory Visit (INDEPENDENT_AMBULATORY_CARE_PROVIDER_SITE_OTHER)
Admission: RE | Admit: 2020-10-02 | Discharge: 2020-10-02 | Disposition: A | Discharge status: Home / Self Care | Payer: Self-pay | Source: Ambulatory Visit | Attending: Cardiology | Admitting: Cardiology

## 2020-10-02 DIAGNOSIS — Z8249 Family history of ischemic heart disease and other diseases of the circulatory system: Secondary | ICD-10-CM

## 2020-10-02 DIAGNOSIS — R0609 Other forms of dyspnea: Secondary | ICD-10-CM

## 2020-10-02 DIAGNOSIS — R0600 Dyspnea, unspecified: Secondary | ICD-10-CM

## 2020-10-04 ENCOUNTER — Telehealth: Payer: Self-pay | Admitting: *Deleted

## 2020-10-04 NOTE — Telephone Encounter (Signed)
-----   Message from Marykay Lex, MD sent at 10/03/2020  4:25 PM EDT ----- Coronary calcium was 564 which is relatively high.  At this level I think we would recommend aggressive risk factor modification with cholesterol, blood pressure and blood sugar control.  Also would recommend 81 mg aspirin.  We need to pay close attention to any potential concerning symptoms.  Bryan Lemma, MD

## 2020-10-04 NOTE — Telephone Encounter (Signed)
Reviewed via mychart .  aspirin 81 mg  is already on patient list

## 2020-10-09 ENCOUNTER — Ambulatory Visit (INDEPENDENT_AMBULATORY_CARE_PROVIDER_SITE_OTHER): Payer: Medicare HMO | Admitting: Medical

## 2020-10-09 ENCOUNTER — Other Ambulatory Visit: Payer: Self-pay

## 2020-10-09 ENCOUNTER — Encounter: Payer: Self-pay | Admitting: Medical

## 2020-10-09 VITALS — BP 124/80 | HR 64 | Temp 97.7°F | Wt 169.0 lb

## 2020-10-09 DIAGNOSIS — E785 Hyperlipidemia, unspecified: Secondary | ICD-10-CM | POA: Insufficient documentation

## 2020-10-09 DIAGNOSIS — R251 Tremor, unspecified: Secondary | ICD-10-CM

## 2020-10-09 DIAGNOSIS — I10 Essential (primary) hypertension: Secondary | ICD-10-CM

## 2020-10-09 DIAGNOSIS — R7301 Impaired fasting glucose: Secondary | ICD-10-CM | POA: Diagnosis not present

## 2020-10-09 DIAGNOSIS — R911 Solitary pulmonary nodule: Secondary | ICD-10-CM | POA: Insufficient documentation

## 2020-10-09 DIAGNOSIS — K760 Fatty (change of) liver, not elsewhere classified: Secondary | ICD-10-CM | POA: Diagnosis not present

## 2020-10-09 DIAGNOSIS — W19XXXD Unspecified fall, subsequent encounter: Secondary | ICD-10-CM

## 2020-10-09 DIAGNOSIS — Z23 Encounter for immunization: Secondary | ICD-10-CM | POA: Diagnosis not present

## 2020-10-09 DIAGNOSIS — W19XXXA Unspecified fall, initial encounter: Secondary | ICD-10-CM | POA: Insufficient documentation

## 2020-10-09 DIAGNOSIS — R29898 Other symptoms and signs involving the musculoskeletal system: Secondary | ICD-10-CM

## 2020-10-09 DIAGNOSIS — Z8249 Family history of ischemic heart disease and other diseases of the circulatory system: Secondary | ICD-10-CM

## 2020-10-09 DIAGNOSIS — Z720 Tobacco use: Secondary | ICD-10-CM

## 2020-10-09 DIAGNOSIS — I2510 Atherosclerotic heart disease of native coronary artery without angina pectoris: Secondary | ICD-10-CM | POA: Diagnosis not present

## 2020-10-09 DIAGNOSIS — M545 Low back pain, unspecified: Secondary | ICD-10-CM | POA: Diagnosis not present

## 2020-10-09 DIAGNOSIS — F1011 Alcohol abuse, in remission: Secondary | ICD-10-CM

## 2020-10-09 DIAGNOSIS — R0683 Snoring: Secondary | ICD-10-CM

## 2020-10-09 DIAGNOSIS — G8929 Other chronic pain: Secondary | ICD-10-CM | POA: Insufficient documentation

## 2020-10-09 MED ORDER — ROSUVASTATIN CALCIUM 10 MG PO TABS: 10.0000 mg | ORAL_TABLET | Freq: Every day | ORAL | 3 refills | Status: DC

## 2020-10-09 NOTE — Progress Notes (Signed)
Referral sent 

## 2020-10-09 NOTE — Progress Notes (Signed)
Subjective:  [name] is a 69 y.o. female who presents for Chief Complaint  Patient presents with  . other    f/u from cardiology       Here today for follow-up from last visit. Last visit here in September she was having leg weakness and other symptoms. We had her see cardiology given uncontrolled blood pressure other symptoms. They added the losartan. Her blood pressure is looking a lot better. she had a coronary calcium score as well. She has follow-up with cardiology soon  She still has leg weakness, shakiness and uses a cane. She has had some falls. She sometimes get back pain. She denies any frank numbness or tingling in the legs but does feel weak  No new complaints  She would like to get the shingles vaccine  No other aggravating or relieving factors.    No other c/o.  The following portions of the patient's history were reviewed and updated as appropriate: allergies, current medications, past family history, past medical history, past social history, past surgical history and problem list.  ROS Otherwise as in subjective above  Past Medical History:  Diagnosis Date  . Allergic rhinitis, cause unspecified   . Arthritis   . Chewing tobacco use   . Coronary artery disease, non-occlusive December 2006  . Dyslipidemia   . Essential hypertension    previously uncontrolled, now well-controlled; no evidence of renal artery stenosis  . GERD (gastroesophageal reflux disease)   . H/O echocardiogram 12/2014   60-65 % EF, mild basal hypertrophy focally at septum, otherwise normal  . Heart murmur 12/2014   mild focal hypertrophy of septum, EF 60-65%, trivial aortic regurgitation  . History of cardiac catheterization December 2006   for exertional chest pain: Nonobstructive CAD  . History of migraine headaches    not an issues as of 07/2017  . Onychomycosis 05/2015  . Pneumococcal vaccine refused 05/2015     Objective: BP 124/80   Pulse 64   Temp 97.7 F (36.5 C)   Wt  169 lb (76.7 kg)   LMP  (LMP Unknown)   BMI 30.91 kg/m    BP Readings from Last 3 Encounters:  10/09/20 124/80  09/19/20 (!) 158/82  09/11/20 (!) 170/82    General appearance: alert, no distress, well developed, well nourished Neck: supple, no lymphadenopathy, no thyromegaly, no masses Heart: RRR, normal S1, S2, no murmurs Lungs: CTA bilaterally, no wheezes, rhonchi, or rales Abdomen: +bs, soft, non tender, non distended, no masses, no hepatomegaly, no splenomegaly Back nontender, relatively normal range of motion Legs nontender, there is some subtle bony fullness in the medial bilateral distal legs just above the ankles symmetrical, no other deformity, range of motion seems within normal limits Leg strength in general seemed a little reduced bilaterally, four-5 out of 5, DTRs blunted, sensation normal and monofilament normal the No frank edema today in the lower legs     Assessment: Encounter Diagnoses  Name Primary?  . Coronary artery disease involving native heart without angina pectoris, unspecified vessel or lesion type Yes  . Need for influenza vaccination   . Leg weakness, bilateral   . Chronic bilateral low back pain without sciatica   . Fatty liver   . Pulmonary nodule   . Essential hypertension, benign   . Impaired fasting blood sugar   . Dyslipidemia   . Family history of premature CAD   . Chewing tobacco use   . Snoring   . Shaking   . History of  alcohol abuse   . Hyperlipidemia, unspecified hyperlipidemia type   . Fall, subsequent encounter   . Need for shingles vaccine      Plan: High blood pressure  Continue your current medications for now  When you see Dr. [name] in follow-up discussed possibly combining valsartan and and hydrochlorothiazide to reduce the pill burden or consider changing the fluid pill to something else like spironolactone if you want to minimize the number of pills  For now continue potassium twice daily since your potassium  did drop a little low recently  Leg weakness  This does not appear to be due to electrolyte disturbance or anemia  I suspect you may have some nerve inflammation or nerve damage in the lumbar spine as a possibility  We will send you for lumbar spine x-ray  The plan will be to follow-up with physical therapy referral to help with your strength, to prevent falls, and to give you home exercises to do  Please go to [facility] for your lumbar xray.   Their hours are 8am - 4:30 pm Monday - Friday.  Take your insurance card with you.  [facility] [phone]  [address], [address] [place], [place] [address]  [address] [place], [place] [address]   Recent findings on scan showing cholesterol buildup in the coronary arteries, high cholesterol and high blood pressure  I recommend you start Crestor daily or at least 3 days/week at bedtime along with baby aspirin daily to lower your risk of heart disease and stroke  Limit baked goods made with margin better such as cakes, pies, donuts, cookies etc.  Eat smaller servings of meat in particular try to limit pork and beef  Falls  Continue to use a cane as needed  Referral to physical therapy to help with strength and balance  Fatty liver disease  This was a finding on your recent scan  The treatment for this is losing weight, trying to maintain a normal weight, eating a low-fat low-cholesterol diet  Also avoiding high sugar foods as well  Pulmonary nodules  Your recent scan showed some small nodules that are likely benign  Nevertheless we should probably repeat this in a year to make sure these are stable or not any different  Follow-up at Dr. [name] soon as planned  Counseled on the influenza virus vaccine.  Vaccine information sheet given.  Influenza vaccine given after consent obtained.  I gave her a prescription for Shingrix to take to the pharmacy after 2 weeks from today's vaccine  Jin was seen  today for other.  Diagnoses and all orders for this visit:  Coronary artery disease involving native heart without angina pectoris, unspecified vessel or lesion type  Need for influenza vaccination -     Flu Vaccine QUAD High Dose(Fluad)  Leg weakness, bilateral -     DG Lumbar Spine Complete; Future -     Ambulatory referral to Physical Therapy  Chronic bilateral low back pain without sciatica -     DG Lumbar Spine Complete; Future -     Ambulatory referral to Physical Therapy  Fatty liver  Pulmonary nodule  Essential hypertension, benign  Impaired fasting blood sugar  Dyslipidemia  Family history of premature CAD  Chewing tobacco use  Snoring  Shaking  History of alcohol abuse  Hyperlipidemia, unspecified hyperlipidemia type  Fall, subsequent encounter  Need for shingles vaccine  Other orders -     rosuvastatin (CRESTOR) 10 MG tablet; Take 1 tablet (10  mg total) by mouth daily.    Follow up: pending xray, PT, cardiology recheck

## 2020-10-09 NOTE — Patient Instructions (Signed)
High blood pressure  Continue your current medications for now  When you see Dr. [name] in follow-up discussed possibly combining valsartan and and hydrochlorothiazide to reduce the pill burden or consider changing the fluid pill to something else like spironolactone if you want to minimize the number of pills  For now continue potassium twice daily since your potassium did drop a little low recently  Leg weakness  This does not appear to be due to electrolyte disturbance or anemia  I suspect you may have some nerve inflammation or nerve damage in the lumbar spine as a possibility  We will send you for lumbar spine x-ray  The plan will be to follow-up with physical therapy referral to help with your strength, to prevent falls, and to give you home exercises to do  Please go to [facility] for your lumbar xray.   Their hours are 8am - 4:30 pm Monday - Friday.  Take your insurance card with you.  [facility] [phone]  [address], [address] [place], [place] [address]  [address] [place], [place] [address]   Recent findings on scan showing cholesterol buildup in the coronary arteries, high cholesterol and high blood pressure  I recommend you start Crestor daily or at least 3 days/week at bedtime along with baby aspirin daily to lower your risk of heart disease and stroke  Limit baked goods made with margin better such as cakes, pies, donuts, cookies etc.  Eat smaller servings of meat in particular try to limit pork and beef  Falls  Continue to use a cane as needed  Referral to physical therapy to help with strength and balance  Fatty liver disease  This was a finding on your recent scan  The treatment for this is losing weight, trying to maintain a normal weight, eating a low-fat low-cholesterol diet  Also avoiding high sugar foods as well  Pulmonary nodules  Your recent scan showed some small nodules that are likely benign  Nevertheless we  should probably repeat this in a year to make sure these are stable or not any different  Follow-up at Dr. [name] soon as planned

## 2020-10-14 ENCOUNTER — Ambulatory Visit
Admission: RE | Admit: 2020-10-14 | Discharge: 2020-10-14 | Disposition: A | Discharge status: Home / Self Care | Payer: Medicare HMO | Source: Ambulatory Visit | Attending: Medical | Admitting: Medical

## 2020-10-14 ENCOUNTER — Other Ambulatory Visit: Payer: Self-pay

## 2020-10-14 DIAGNOSIS — G8929 Other chronic pain: Secondary | ICD-10-CM

## 2020-10-14 DIAGNOSIS — R29898 Other symptoms and signs involving the musculoskeletal system: Secondary | ICD-10-CM

## 2020-10-14 DIAGNOSIS — M545 Low back pain, unspecified: Secondary | ICD-10-CM

## 2020-10-14 DIAGNOSIS — M5136 Other intervertebral disc degeneration, lumbar region: Secondary | ICD-10-CM | POA: Diagnosis not present

## 2020-10-14 DIAGNOSIS — R531 Weakness: Secondary | ICD-10-CM | POA: Diagnosis not present

## 2020-10-14 DIAGNOSIS — M48061 Spinal stenosis, lumbar region without neurogenic claudication: Secondary | ICD-10-CM | POA: Diagnosis not present

## 2020-10-18 ENCOUNTER — Other Ambulatory Visit: Payer: Medicare HMO

## 2020-10-21 ENCOUNTER — Other Ambulatory Visit: Payer: Self-pay | Admitting: Medical

## 2020-10-21 NOTE — Telephone Encounter (Signed)
Checking with patient to see if refills need to go to mail order.

## 2020-10-29 ENCOUNTER — Ambulatory Visit: Payer: Medicare HMO | Attending: Medical

## 2020-10-29 ENCOUNTER — Other Ambulatory Visit: Payer: Self-pay

## 2020-10-29 DIAGNOSIS — R2681 Unsteadiness on feet: Secondary | ICD-10-CM | POA: Diagnosis not present

## 2020-10-29 DIAGNOSIS — M6281 Muscle weakness (generalized): Secondary | ICD-10-CM | POA: Diagnosis not present

## 2020-10-29 DIAGNOSIS — G8929 Other chronic pain: Secondary | ICD-10-CM

## 2020-10-29 DIAGNOSIS — M545 Low back pain, unspecified: Secondary | ICD-10-CM | POA: Insufficient documentation

## 2020-10-29 NOTE — Therapy (Signed)
[facility] [address] [place], [place], [address] Phone: [phone]   Fax:  [phone]  Physical Therapy Evaluation  Patient Details  Name: [name] MRN: [id number] Date of Birth: [birth year] Referring Provider (PT): Crosby Oyster, [place]   Encounter Date: 10/29/2020   PT End of Session - 10/29/20 1104    Visit Number 1    Number of Visits 10    Date for PT Re-Evaluation 01/03/21    Authorization Type [employer] MCR    PT Start Time 1105    PT Stop Time 1200    PT Time Calculation (min) 55 min    Activity Tolerance Patient tolerated treatment well    Behavior During Therapy [facility] for tasks assessed/performed           Past Medical History:  Diagnosis Date  . Allergic rhinitis, cause unspecified   . Arthritis   . Chewing tobacco use   . Coronary artery disease, non-occlusive December 2006  . Dyslipidemia   . Essential hypertension    previously uncontrolled, now well-controlled; no evidence of renal artery stenosis  . GERD (gastroesophageal reflux disease)   . H/O echocardiogram 12/2014   60-65 % EF, mild basal hypertrophy focally at septum, otherwise normal  . Heart murmur 12/2014   mild focal hypertrophy of septum, EF 60-65%, trivial aortic regurgitation  . History of cardiac catheterization December 2006   for exertional chest pain: Nonobstructive CAD  . History of migraine headaches    not an issues as of 07/2017  . Onychomycosis 05/2015  . Pneumococcal vaccine refused 05/2015    Past Surgical History:  Procedure Laterality Date  . ABDOMINAL HYSTERECTOMY     PARTIAL (benign, for bleeding), still has ovaries  . CAROTID ANGIOGRAM  2005  . COLONOSCOPY  2009   Dr. Loreta Ave  . ESOPHAGOGASTRODUODENOSCOPY  2009   Dr. Loreta Ave  . EYE SURGERY     new lenses 07/2017  . LAPAROSCOPY  12/02   ovarian cyst  . LEFT HEART CATH AND CORONARY ANGIOGRAPHY  12/06   Dr. Clarene Duke; no significant CAD. Normal LV function with EF of  60-65%. Also noted no RAS.  Marland Kitchen NM MYOVIEW LTD  12/2014   LOW RISK.  EF 63%.  Fixed Small size mild intensity distal anteroapical defect suggestive of breast attenuation.  No reversibility. = Breast attenuation.  NO INFARCT OR ISCHEMIA.  Marland Kitchen TRANSTHORACIC ECHOCARDIOGRAM  12/2014   EF 60 to 65%.  Normal wall motion and no valvular lesions.  Normal LV filling pressures.    There were no vitals filed for this visit.    Subjective Assessment - 10/29/20 1115    Subjective She is not sure why her legs are weak but some days are better than others. Last walked 2-3 miles per day a year ago.    Limitations Standing;Walking;House hold activities    How long can you walk comfortably? 150 feet , mostly home distances    Diagnostic tests Xray: OA    Patient Stated Goals She wants to have leg strength and walk again.    Currently in Pain? No/denies    Pain Orientation Right;Left;Anterior    Pain Descriptors / Indicators Aching    Pain Type Chronic pain    Pain Onset More than a month ago    Pain Frequency Intermittent    Aggravating Factors  walking    Pain Relieving Factors meds              [facility] PT Assessment -  10/29/20 0001      Assessment   Medical Diagnosis chronic LBP    Referring Provider (PT) Crosby Oyster, PA    Onset Date/Surgical Date --   01/2020   Next MD Visit next year    Prior Therapy no      Precautions   Precautions None      Restrictions   Weight Bearing Restrictions No      Balance Screen   Has the patient fallen in the past 6 months Yes    How many times? 3   LT leg gave out   Has the patient had a decrease in activity level because of a fear of falling?  Yes    Is the patient reluctant to leave their home because of a fear of falling?  No      Home Environment   Living Environment Private residence    Living Arrangements Spouse/significant other    Type of Home House    Home Access Level entry    Home Layout One level      Prior Function   Level of  Independence Independent;Requires assistive device for independence;Needs assistance with homemaking    Vocation Retired      IT consultant   Overall Cognitive Status Within Functional Limits for tasks assessed      Observation/Other Assessments   Focus on Therapeutic Outcomes (FOTO)  40% with potential progress to 58%      ROM / Strength   AROM / PROM / Strength AROM;PROM;Strength      AROM   AROM Assessment Site Lumbar    Lumbar Flexion 50    Lumbar Extension 10    Lumbar - Right Side Bend 10    Lumbar - Left Side Bend 10      Strength   Overall Strength Comments Poor abdominal strength and control    Strength Assessment Site Hip;Knee;Ankle    Right/Left Hip Right;Left    Right Hip Flexion 5/5    Right Hip Extension 4-/5    Right Hip External Rotation  5/5    Right Hip Internal Rotation 5/5    Right Hip ABduction 4-/5    Left Hip Flexion 5/5    Left Hip Extension 4-/5    Left Hip External Rotation 5/5    Left Hip Internal Rotation 5/5    Left Hip ABduction 4-/5    Right/Left Knee Right;Left    Right/Left Ankle Right;Left    Right Ankle Dorsiflexion 4+/5    Right Ankle Plantar Flexion 4/5    Right Ankle Inversion 4+/5    Right Ankle Eversion 4/5    Left Ankle Dorsiflexion 4+/5    Left Ankle Plantar Flexion 4/5    Left Ankle Inversion 4+/5    Left Ankle Eversion 4+/5      Flexibility   Soft Tissue Assessment /Muscle Length yes    Hamstrings 60 degrees    Quadriceps 100 degrees prone.       Ambulation/Gait   Gait Comments SPC with decreased stride bilaterally.       Standardized Balance Assessment   Standardized Balance Assessment Berg Balance Test;Five Times Sit to Stand    Five times sit to stand comments  15 sec      Berg Balance Test   Sit to Stand Able to stand without using hands and stabilize independently    Standing Unsupported Able to stand safely 2 minutes    Sitting with Back Unsupported but Feet Supported on Floor or Stool Able to  sit safely and  securely 2 minutes    Stand to Sit Sits safely with minimal use of hands    Transfers Able to transfer safely, minor use of hands    Standing Unsupported with Eyes Closed Able to stand 10 seconds with supervision    Standing Unsupported with Feet Together Able to place feet together independently and stand 1 minute safely    From Standing, Reach Forward with Outstretched Arm Can reach forward >12 cm safely (5")    From Standing Position, Pick up Object from Floor Able to pick up shoe, needs supervision    From Standing Position, Turn to Look Behind Over each Shoulder Needs supervision when turning    Turn 360 Degrees Able to turn 360 degrees safely but slowly    Standing Unsupported, Alternately Place Feet on Step/Stool Able to complete 4 steps without aid or supervision    Standing Unsupported, One Foot in Front Able to take small step independently and hold 30 seconds    Standing on One Leg Tries to lift leg/unable to hold 3 seconds but remains standing independently    Total Score 41    Berg comment: Discussed score with patient.                       Objective measurements completed on examination: See above findings.               PT Education - 10/29/20 1216    Education Details POC  ,HEP., Reviewed FOTO and possible progress    Person(s) Educated Patient    Methods Explanation;Tactile cues;Verbal cues;Handout    Comprehension Verbalized understanding;Returned demonstration            PT Short Term Goals - 10/29/20 1212      PT SHORT TERM GOAL #1   Title She will be independent with initial HEP    Baseline no program    Time 3    Period Weeks    Status New      PT SHORT TERM GOAL #2   Title Sharlene Motts will improve to 45/56    Time 4    Period Weeks    Status New      PT SHORT TERM GOAL #3   Title She will walk 2 blocks with assist as needed at home    Time 4    Period Weeks    Status New      PT SHORT TERM GOAL #4   Title FOTO score improved  by 5 points    Time 4    Period Weeks    Status New             PT Long Term Goals - 10/29/20 1105      PT LONG TERM GOAL #1   Title She will be indpendent with all HEP issued    Baseline independent with initial HEP    Time 6    Period Weeks    Status New      PT LONG TERM GOAL #2   Title She will improve BERG to 50/56    Time 10    Period Weeks    Status New      PT LONG TERM GOAL #3   Title She will walk with least device 1/2 mile at home    Time 10    Period Weeks    Status New      PT LONG TERM GOAL #4   Title  FOTO score improved to 58% to demo percieved improvement    Time 10    Period Weeks    Status New      PT LONG TERM GOAL #5   Title She will report no falls in past 4 weeks    Time 10    Period Weeks    Status New                  Plan - 10/29/20 1104    Clinical Impression Statement Ms Derrill KayGoodman presentw with reports of weakness and instability on her feet. Onset without incident earlier this year.   She reports she was  able to walk > a mile prior to this year. She has some hip weakness but not significant enough to warrent the problems she is having. She  scored 41/56 on BERG indicating need for SPC at all times to minimize fall and we discussed  on bad day she may need walker. This problem does not appear to come from her back though she is stiff in her spine. She would benefit from skilled PT  and a consistent HEP.    Personal Factors and Comorbidities Time since onset of injury/illness/exacerbation;Fitness;Comorbidity 1    Comorbidities chronic back pain.    Examination-Activity Limitations Locomotion Level;Carry;Stand;Lift;Squat    Examination-Participation Restrictions Cleaning;Community Activity;Shop;Meal Prep    Stability/Clinical Decision Making Evolving/Moderate complexity    Clinical Decision Making Moderate    Rehab Potential Good    PT Frequency 2x / week    PT Duration 6 weeks    PT Treatment/Interventions Manual  techniques;Patient/family education;Therapeutic exercise;Therapeutic activities;Passive range of motion;Electrical Stimulation;Moist Heat    PT Home Exercise Plan Bridge , SLR, KTC, LTR, LAQ    Consulted and Agree with Plan of Care Patient           Patient will benefit from skilled therapeutic intervention in order to improve the following deficits and impairments:  Pain, Difficulty walking, Decreased activity tolerance, Decreased strength, Decreased balance  Visit Diagnosis: Muscle weakness (generalized) - Plan: PT plan of care cert/re-cert  Unsteadiness on feet - Plan: PT plan of care cert/re-cert  Chronic bilateral low back pain without sciatica - Plan: PT plan of care cert/re-cert     Problem List Patient Active Problem List   Diagnosis Date Noted  . Need for influenza vaccination 10/09/2020  . Fatty liver 10/09/2020  . Chronic bilateral low back pain without sciatica 10/09/2020  . Pulmonary nodule 10/09/2020  . Coronary artery disease involving native heart without angina pectoris 10/09/2020  . Hyperlipidemia 10/09/2020  . Fall 10/09/2020  . Need for shingles vaccine 10/09/2020  . Leg weakness, bilateral 09/11/2020  . Shaking 09/11/2020  . DOE (dyspnea on exertion) 09/11/2020  . Snoring 09/11/2020  . Decreased pedal pulses 09/11/2020  . Lower extremity edema 05/21/2020  . Thrombophlebitis 05/21/2020  . Estrogen deficiency 02/23/2020  . Impaired fasting blood sugar 02/23/2020  . Left leg swelling 02/23/2020  . Vertigo 06/29/2019  . History of alcohol abuse 06/29/2019  . Insomnia 09/21/2018  . Abnormal thyroid blood test 09/21/2018  . Advanced directives, counseling/discussion 08/05/2017  . Post-menopausal 08/05/2017  . History of fall 08/05/2017  . Coronary artery disease, non-occlusive 08/05/2017  . Hypokalemia 09/14/2016  . Encounter for health maintenance examination in adult 10/30/2015  . Vaccine counseling 10/30/2015  . Screening for breast cancer  10/30/2015  . Chewing tobacco use 06/18/2015  . Dyslipidemia 06/18/2015  . Family history of premature CAD 01/10/2015  . GERD (gastroesophageal reflux  disease) 04/28/2012  . Essential hypertension, benign 09/21/2011    Caprice Red  PT 10/29/2020, 1:27 PM  [facility] [address] [place], [place], [address] Phone: [phone]   Fax:  [phone]  Name: STARLET GALLENTINE MRN: [id number] Date of Birth: 1951-03-31

## 2020-10-29 NOTE — Patient Instructions (Signed)
Stretches LTR and knee to chest x 3-10 5 -15 sec stretch  And bridge, LAQ and SLR  X 10 2 x/day RT/LT

## 2020-11-06 ENCOUNTER — Ambulatory Visit: Payer: Medicare HMO | Admitting: Physical Therapy

## 2020-11-06 ENCOUNTER — Encounter: Payer: Self-pay | Admitting: Physical Therapy

## 2020-11-06 ENCOUNTER — Other Ambulatory Visit: Payer: Self-pay

## 2020-11-06 DIAGNOSIS — R2681 Unsteadiness on feet: Secondary | ICD-10-CM | POA: Diagnosis not present

## 2020-11-06 DIAGNOSIS — M545 Low back pain, unspecified: Secondary | ICD-10-CM | POA: Diagnosis not present

## 2020-11-06 DIAGNOSIS — M6281 Muscle weakness (generalized): Secondary | ICD-10-CM

## 2020-11-06 DIAGNOSIS — G8929 Other chronic pain: Secondary | ICD-10-CM

## 2020-11-06 NOTE — Therapy (Signed)
[facility]patient Rehabilitation [facility] [address] [place], [place], [address] Phone: [phone]   Fax:  [phone]  Physical Therapy Treatment  Patient Details  Name: [name] MRN: [id number] Date of Birth: 02/09/1951 Referring Provider (PT): Crosby Oyster, [place]   Encounter Date: 11/06/2020   PT End of Session - 11/06/20 1407    Visit Number 2    Number of Visits 10    Date for PT Re-Evaluation 01/03/21    Authorization Type [employer] MCR    PT Start Time 1400    PT Stop Time 1440    PT Time Calculation (min) 40 min    Activity Tolerance Patient tolerated treatment well    Behavior During Therapy [facility] for tasks assessed/performed           Past Medical History:  Diagnosis Date  . Allergic rhinitis, cause unspecified   . Arthritis   . Chewing tobacco use   . Coronary artery disease, non-occlusive December 2006  . Dyslipidemia   . Essential hypertension    previously uncontrolled, now well-controlled; no evidence of renal artery stenosis  . GERD (gastroesophageal reflux disease)   . H/O echocardiogram 12/2014   60-65 % EF, mild basal hypertrophy focally at septum, otherwise normal  . Heart murmur 12/2014   mild focal hypertrophy of septum, EF 60-65%, trivial aortic regurgitation  . History of cardiac catheterization December 2006   for exertional chest pain: Nonobstructive CAD  . History of migraine headaches    not an issues as of 07/2017  . Onychomycosis 05/2015  . Pneumococcal vaccine refused 05/2015    Past Surgical History:  Procedure Laterality Date  . ABDOMINAL HYSTERECTOMY     PARTIAL (benign, for bleeding), still has ovaries  . CAROTID ANGIOGRAM  2005  . COLONOSCOPY  2009   Dr. Loreta Ave  . ESOPHAGOGASTRODUODENOSCOPY  2009   Dr. Loreta Ave  . EYE SURGERY     new lenses 07/2017  . LAPAROSCOPY  12/02   ovarian cyst  . LEFT HEART CATH AND CORONARY ANGIOGRAPHY  12/06   Dr. Clarene Duke; no significant CAD. Normal LV function with EF of  60-65%. Also noted no RAS.  Marland Kitchen NM MYOVIEW LTD  12/2014   LOW RISK.  EF 63%.  Fixed Small size mild intensity distal anteroapical defect suggestive of breast attenuation.  No reversibility. = Breast attenuation.  NO INFARCT OR ISCHEMIA.  Marland Kitchen TRANSTHORACIC ECHOCARDIOGRAM  12/2014   EF 60 to 65%.  Normal wall motion and no valvular lesions.  Normal LV filling pressures.    There were no vitals filed for this visit.   Subjective Assessment - 11/06/20 1357    Subjective Min pain Rt shin.  Has done some of the exercises.  I may have overdone it.    Currently in Pain? Yes    Pain Score --   mild   Pain Location Leg    Pain Orientation Right;Anterior    Pain Descriptors / Indicators Sore    Pain Type Acute pain    Pain Onset In the past 7 days    Pain Frequency Intermittent    Aggravating Factors  not sure    Pain Relieving Factors not sure    Multiple Pain Sites No                [facility] Adult PT Treatment/Exercise - 11/06/20 0001      Transfers   Five time sit to stand comments  13 sec with hands, 15. 4 sec without hands  Lumbar Exercises: Stretches   Single Knee to Chest Stretch 2 reps;30 seconds    Lower Trunk Rotation 5 reps;10 seconds    Pelvic Tilt 10 reps      Knee/Hip Exercises: Stretches   Piriformis Stretch Both;3 reps    Piriformis Stretch Limitations seated       Knee/Hip Exercises: Aerobic   Nustep Le and UE Level 2 for 6 min O2 96%       Knee/Hip Exercises: Seated   Sit to Sand 1 set;10 reps      Knee/Hip Exercises: Supine   Hip Adduction Isometric Limitations x 10     Bridges Limitations partial range x 10     Straight Leg Raises Both;2 sets;10 reps      Knee/Hip Exercises: Sidelying   Hip ABduction Both;2 sets;10 reps    Clams x 10                   PT Education - 11/06/20 1447    Education Details potential referral out for more testing (Neuro, Rheum), HEP, soreness vs pain    Person(s) Educated Patient    Methods  Explanation;Demonstration;Handout    Comprehension Verbalized understanding;Returned demonstration            PT Short Term Goals - 10/29/20 1212      PT SHORT TERM GOAL #1   Title She will be independent with initial HEP    Baseline no program    Time 3    Period Weeks    Status New      PT SHORT TERM GOAL #2   Title [name] will improve to 45/56    Time 4    Period Weeks    Status New      PT SHORT TERM GOAL #3   Title She will walk 2 blocks with assist as needed at home    Time 4    Period Weeks    Status New      PT SHORT TERM GOAL #4   Title FOTO score improved by 5 points    Time 4    Period Weeks    Status New             PT Long Term Goals - 10/29/20 1105      PT LONG TERM GOAL #1   Title She will be indpendent with all HEP issued    Baseline independent with initial HEP    Time 6    Period Weeks    Status New      PT LONG TERM GOAL #2   Title She will improve BERG to 50/56    Time 10    Period Weeks    Status New      PT LONG TERM GOAL #3   Title She will walk with least device 1/2 mile at home    Time 10    Period Weeks    Status New      PT LONG TERM GOAL #4   Title FOTO score improved to 58% to demo percieved improvement    Time 10    Period Weeks    Status New      PT LONG TERM GOAL #5   Title She will report no falls in past 4 weeks    Time 10    Period Weeks    Status New                 Plan - 11/06/20 1408  Clinical Impression Statement Patient able to perform 2 sets of HEP (6-10 reps) with mod fatigue.   Sit to stand 13-15 sec with and without hands.  She has limited strength for bridge lifts, worked on posterior pelvic tilt to engage abdominals more effectively.    PT Treatment/Interventions Manual techniques;Patient/family education;Therapeutic exercise;Therapeutic activities;Passive range of motion;Electrical Stimulation;Moist Heat    PT Next Visit Plan progress strength and endurance    PT Home Exercise Plan  Bridge , SLR, KTC, LTR, LAQ, piriformis stretch    Consulted and Agree with Plan of Care Patient           Patient will benefit from skilled therapeutic intervention in order to improve the following deficits and impairments:  Pain, Difficulty walking, Decreased activity tolerance, Decreased strength, Decreased balance  Visit Diagnosis: Muscle weakness (generalized)  Unsteadiness on feet  Chronic bilateral low back pain without sciatica     Problem List Patient Active Problem List   Diagnosis Date Noted  . Need for influenza vaccination 10/09/2020  . Fatty liver 10/09/2020  . Chronic bilateral low back pain without sciatica 10/09/2020  . Pulmonary nodule 10/09/2020  . Coronary artery disease involving native heart without angina pectoris 10/09/2020  . Hyperlipidemia 10/09/2020  . Fall 10/09/2020  . Need for shingles vaccine 10/09/2020  . Leg weakness, bilateral 09/11/2020  . Shaking 09/11/2020  . DOE (dyspnea on exertion) 09/11/2020  . Snoring 09/11/2020  . Decreased pedal pulses 09/11/2020  . Lower extremity edema 05/21/2020  . Thrombophlebitis 05/21/2020  . Estrogen deficiency 02/23/2020  . Impaired fasting blood sugar 02/23/2020  . Left leg swelling 02/23/2020  . Vertigo 06/29/2019  . History of alcohol abuse 06/29/2019  . Insomnia 09/21/2018  . Abnormal thyroid blood test 09/21/2018  . Advanced directives, counseling/discussion 08/05/2017  . Post-menopausal 08/05/2017  . History of fall 08/05/2017  . Coronary artery disease, non-occlusive 08/05/2017  . Hypokalemia 09/14/2016  . Encounter for health maintenance examination in adult 10/30/2015  . Vaccine counseling 10/30/2015  . Screening for breast cancer 10/30/2015  . Chewing tobacco use 06/18/2015  . Dyslipidemia 06/18/2015  . Family history of premature CAD 01/10/2015  . GERD (gastroesophageal reflux disease) 04/28/2012  . Essential hypertension, benign 09/21/2011    Marya Lowden 11/06/2020, 4:26  PM  [facility] [address] [place], [place], [address] Phone: [phone]   Fax:  [phone]  Name: [name] MRN: [id number] Date of Birth: [birth year]  Karie Mainland, PT 11/06/20 4:26 PM Phone: [phone] Fax: [phone]

## 2020-11-06 NOTE — Patient Instructions (Signed)
Access Code: [email or web address]: [email or web address]: 11/10/2021Prepared by: Victorino Dike PaaExercises  Supine Bridge - 1 x daily - 7 x weekly - 2 sets - 10 reps - 5 hold  Supine Active Straight Leg Raise - 1 x daily - 7 x weekly - 2 sets - 10 reps - 5 hold  Supine Lower Trunk Rotation - 1 x daily - 7 x weekly - 2 sets - 10 reps - 10 hold  Supine Posterior Pelvic Tilt - 1 x daily - 7 x weekly - 2 sets - 10 reps - 10 hold  Seated Figure 4 Piriformis Stretch - 1 x daily - 7 x weekly - 1 sets - 3 reps - 30 hold

## 2020-11-08 ENCOUNTER — Other Ambulatory Visit: Payer: Self-pay | Admitting: Medical

## 2020-11-12 ENCOUNTER — Other Ambulatory Visit: Payer: Self-pay

## 2020-11-12 ENCOUNTER — Ambulatory Visit: Payer: Medicare HMO

## 2020-11-12 DIAGNOSIS — R2681 Unsteadiness on feet: Secondary | ICD-10-CM

## 2020-11-12 DIAGNOSIS — M6281 Muscle weakness (generalized): Secondary | ICD-10-CM

## 2020-11-12 DIAGNOSIS — G8929 Other chronic pain: Secondary | ICD-10-CM

## 2020-11-12 DIAGNOSIS — M545 Low back pain, unspecified: Secondary | ICD-10-CM | POA: Diagnosis not present

## 2020-11-12 NOTE — Therapy (Signed)
[facility] Outpatient Rehabilitation [facility] [address] [place], [place], [address] Phone: [phone]   Fax:  [phone]  Physical Therapy Treatment  Patient Details  Name: [name] MRN: [id number] Date of Birth: 03-23-1951 Referring Provider (PT): Crosby Oyster, [place]   Encounter Date: 11/12/2020   PT End of Session - 11/12/20 1536    Visit Number 3    Number of Visits 10    Date for PT Re-Evaluation 01/03/21    Authorization Type [employer] MCR    PT Start Time 0330    PT Stop Time 0408    PT Time Calculation (min) 38 min    Activity Tolerance Patient tolerated treatment well    Behavior During Therapy [facility] for tasks assessed/performed           Past Medical History:  Diagnosis Date  . Allergic rhinitis, cause unspecified   . Arthritis   . Chewing tobacco use   . Coronary artery disease, non-occlusive December 2006  . Dyslipidemia   . Essential hypertension    previously uncontrolled, now well-controlled; no evidence of renal artery stenosis  . GERD (gastroesophageal reflux disease)   . H/O echocardiogram 12/2014   60-65 % EF, mild basal hypertrophy focally at septum, otherwise normal  . Heart murmur 12/2014   mild focal hypertrophy of septum, EF 60-65%, trivial aortic regurgitation  . History of cardiac catheterization December 2006   for exertional chest pain: Nonobstructive CAD  . History of migraine headaches    not an issues as of 07/2017  . Onychomycosis 05/2015  . Pneumococcal vaccine refused 05/2015    Past Surgical History:  Procedure Laterality Date  . ABDOMINAL HYSTERECTOMY     PARTIAL (benign, for bleeding), still has ovaries  . CAROTID ANGIOGRAM  2005  . COLONOSCOPY  2009   Dr. Loreta Ave  . ESOPHAGOGASTRODUODENOSCOPY  2009   Dr. Loreta Ave  . EYE SURGERY     new lenses 07/2017  . LAPAROSCOPY  12/02   ovarian cyst  . LEFT HEART CATH AND CORONARY ANGIOGRAPHY  12/06   Dr. Clarene Duke; no significant CAD. Normal LV function with EF of  60-65%. Also noted no RAS.  Marland Kitchen NM MYOVIEW LTD  12/2014   LOW RISK.  EF 63%.  Fixed Small size mild intensity distal anteroapical defect suggestive of breast attenuation.  No reversibility. = Breast attenuation.  NO INFARCT OR ISCHEMIA.  Marland Kitchen TRANSTHORACIC ECHOCARDIOGRAM  12/2014   EF 60 to 65%.  Normal wall motion and no valvular lesions.  Normal LV filling pressures.    There were no vitals filed for this visit.   Subjective Assessment - 11/12/20 1540    Subjective Woke this AM and legs feel wobbly. Was doing well since last week.    Currently in Pain? Yes    Pain Score 2     Pain Location Leg    Pain Orientation Right;Left    Pain Descriptors / Indicators Sore    Pain Type Chronic pain    Pain Onset More than a month ago    Pain Frequency Intermittent    Aggravating Factors  not sure    Pain Relieving Factors not sure                             [facility] Adult PT Treatment/Exercise - 11/12/20 0001      Transfers   Five time sit to stand comments  4 sets of 5  Ambulation/Gait   Gait Comments SPC with decreased stride bilaterally.       Lumbar Exercises: Sidelying   Clam Right;Left;15 reps    Hip Abduction Right;Left;10 reps    Hip Abduction Limitations 2 sets      Knee/Hip Exercises: Aerobic   Nustep Le and UE Level 4 for       Knee/Hip Exercises: Supine   Bridges Both;10 reps;2 sets    Straight Leg Raises 2 sets;10 reps;Right;Left      Knee/Hip Exercises: Sidelying   Hip ABduction Right;Left;2 sets;10 reps                    PT Short Term Goals - 10/29/20 1212      PT SHORT TERM GOAL #1   Title She will be independent with initial HEP    Baseline no program    Time 3    Period Weeks    Status New      PT SHORT TERM GOAL #2   Title Sharlene Motts will improve to 45/56    Time 4    Period Weeks    Status New      PT SHORT TERM GOAL #3   Title She will walk 2 blocks with assist as needed at home    Time 4    Period Weeks    Status  New      PT SHORT TERM GOAL #4   Title FOTO score improved by 5 points    Time 4    Period Weeks    Status New             PT Long Term Goals - 10/29/20 1105      PT LONG TERM GOAL #1   Title She will be indpendent with all HEP issued    Baseline independent with initial HEP    Time 6    Period Weeks    Status New      PT LONG TERM GOAL #2   Title She will improve BERG to 50/56    Time 10    Period Weeks    Status New      PT LONG TERM GOAL #3   Title She will walk with least device 1/2 mile at home    Time 10    Period Weeks    Status New      PT LONG TERM GOAL #4   Title FOTO score improved to 58% to demo percieved improvement    Time 10    Period Weeks    Status New      PT LONG TERM GOAL #5   Title She will report no falls in past 4 weeks    Time 10    Period Weeks    Status New                 Plan - 11/12/20 1537    Clinical Impression Statement By end of session legs felt better and walking better though still unsteady.   Asked her to take a day rest from exerise and resume this Thursday in PT.  She really did well with the exercise today    PT Treatment/Interventions Manual techniques;Patient/family education;Therapeutic exercise;Therapeutic activities;Passive range of motion;Electrical Stimulation;Moist Heat    PT Next Visit Plan progress strength and endurance    balance    PT Home Exercise Plan Bridge , SLR, KTC, LTR, LAQ, piriformis stretch    Consulted and Agree with Plan of Care Patient  Patient will benefit from skilled therapeutic intervention in order to improve the following deficits and impairments:  Pain, Difficulty walking, Decreased activity tolerance, Decreased strength, Decreased balance  Visit Diagnosis: Muscle weakness (generalized)  Unsteadiness on feet  Chronic bilateral low back pain without sciatica     Problem List Patient Active Problem List   Diagnosis Date Noted  . Need for influenza  vaccination 10/09/2020  . Fatty liver 10/09/2020  . Chronic bilateral low back pain without sciatica 10/09/2020  . Pulmonary nodule 10/09/2020  . Coronary artery disease involving native heart without angina pectoris 10/09/2020  . Hyperlipidemia 10/09/2020  . Fall 10/09/2020  . Need for shingles vaccine 10/09/2020  . Leg weakness, bilateral 09/11/2020  . Shaking 09/11/2020  . DOE (dyspnea on exertion) 09/11/2020  . Snoring 09/11/2020  . Decreased pedal pulses 09/11/2020  . Lower extremity edema 05/21/2020  . Thrombophlebitis 05/21/2020  . Estrogen deficiency 02/23/2020  . Impaired fasting blood sugar 02/23/2020  . Left leg swelling 02/23/2020  . Vertigo 06/29/2019  . History of alcohol abuse 06/29/2019  . Insomnia 09/21/2018  . Abnormal thyroid blood test 09/21/2018  . Advanced directives, counseling/discussion 08/05/2017  . Post-menopausal 08/05/2017  . History of fall 08/05/2017  . Coronary artery disease, non-occlusive 08/05/2017  . Hypokalemia 09/14/2016  . Encounter for health maintenance examination in adult 10/30/2015  . Vaccine counseling 10/30/2015  . Screening for breast cancer 10/30/2015  . Chewing tobacco use 06/18/2015  . Dyslipidemia 06/18/2015  . Family history of premature CAD 01/10/2015  . GERD (gastroesophageal reflux disease) 04/28/2012  . Essential hypertension, benign 09/21/2011    Caprice Red  PT 11/12/2020, 5:08 PM  [facility] [address] [place], [place], [address] Phone: [phone]   Fax:  [phone]  Name: TAKIRAH BINFORD MRN: [id number] Date of Birth: 12/24/1951

## 2020-11-14 ENCOUNTER — Ambulatory Visit: Payer: Medicare HMO

## 2020-11-14 ENCOUNTER — Other Ambulatory Visit: Payer: Self-pay

## 2020-11-14 DIAGNOSIS — R2681 Unsteadiness on feet: Secondary | ICD-10-CM

## 2020-11-14 DIAGNOSIS — G8929 Other chronic pain: Secondary | ICD-10-CM

## 2020-11-14 DIAGNOSIS — M545 Low back pain, unspecified: Secondary | ICD-10-CM

## 2020-11-14 DIAGNOSIS — M6281 Muscle weakness (generalized): Secondary | ICD-10-CM | POA: Diagnosis not present

## 2020-11-14 NOTE — Therapy (Signed)
[facility] Outpatient Rehabilitation [facility] [address] [place], [place], [address] Phone: [phone]   Fax:  [phone]  Physical Therapy Treatment  Patient Details  Name: [name] MRN: [id number] Date of Birth: 01-15-1951 Referring Provider (PT): Crosby Oyster, [place]   Encounter Date: 11/14/2020   PT End of Session - 11/14/20 1119    Visit Number 4    Number of Visits 10    Date for PT Re-Evaluation 01/03/21    Authorization Type [employer] MCR    PT Start Time 1120    PT Stop Time 1200    PT Time Calculation (min) 40 min    Activity Tolerance Patient tolerated treatment well    Behavior During Therapy [facility] for tasks assessed/performed           Past Medical History:  Diagnosis Date  . Allergic rhinitis, cause unspecified   . Arthritis   . Chewing tobacco use   . Coronary artery disease, non-occlusive December 2006  . Dyslipidemia   . Essential hypertension    previously uncontrolled, now well-controlled; no evidence of renal artery stenosis  . GERD (gastroesophageal reflux disease)   . H/O echocardiogram 12/2014   60-65 % EF, mild basal hypertrophy focally at septum, otherwise normal  . Heart murmur 12/2014   mild focal hypertrophy of septum, EF 60-65%, trivial aortic regurgitation  . History of cardiac catheterization December 2006   for exertional chest pain: Nonobstructive CAD  . History of migraine headaches    not an issues as of 07/2017  . Onychomycosis 05/2015  . Pneumococcal vaccine refused 05/2015    Past Surgical History:  Procedure Laterality Date  . ABDOMINAL HYSTERECTOMY     PARTIAL (benign, for bleeding), still has ovaries  . CAROTID ANGIOGRAM  2005  . COLONOSCOPY  2009   Dr. Loreta Ave  . ESOPHAGOGASTRODUODENOSCOPY  2009   Dr. Loreta Ave  . EYE SURGERY     new lenses 07/2017  . LAPAROSCOPY  12/02   ovarian cyst  . LEFT HEART CATH AND CORONARY ANGIOGRAPHY  12/06   Dr. Clarene Duke; no significant CAD. Normal LV function with EF of  60-65%. Also noted no RAS.  Marland Kitchen NM MYOVIEW LTD  12/2014   LOW RISK.  EF 63%.  Fixed Small size mild intensity distal anteroapical defect suggestive of breast attenuation.  No reversibility. = Breast attenuation.  NO INFARCT OR ISCHEMIA.  Marland Kitchen TRANSTHORACIC ECHOCARDIOGRAM  12/2014   EF 60 to 65%.  Normal wall motion and no valvular lesions.  Normal LV filling pressures.    There were no vitals filed for this visit.   Subjective Assessment - 11/14/20 1127    Subjective Legs are sore today but not giving up    Pain Score 3     Pain Location Leg    Pain Orientation Left;Right    Pain Descriptors / Indicators Sore    Pain Onset More than a month ago    Pain Frequency Intermittent                             [facility] Adult PT Treatment/Exercise - 11/14/20 0001      Neuro Re-ed    Neuro Re-ed Details  in bars tandem walking and stance , eyes closed and side steps and exagerated heel strike RT and LT  with use of the bars.    Then no bars with eyes closes an dstaggered stance  , exagerated heel strike , hip  ER  step RT and LT .  all with contact guard with belt   Then side stepping no UE support      Lumbar Exercises: Aerobic   Nustep UE/LE L4  7 min                  PT Education - 11/14/20 1204    Education Details OK to start every other day exercises and that soreness is normal with starting an exercise program.    Person(s) Educated Patient    Methods Explanation    Comprehension Verbalized understanding            PT Short Term Goals - 10/29/20 1212      PT SHORT TERM GOAL #1   Title She will be independent with initial HEP    Baseline no program    Time 3    Period Weeks    Status New      PT SHORT TERM GOAL #2   Title Sharlene Motts will improve to 45/56    Time 4    Period Weeks    Status New      PT SHORT TERM GOAL #3   Title She will walk 2 blocks with assist as needed at home    Time 4    Period Weeks    Status New      PT SHORT TERM GOAL #4    Title FOTO score improved by 5 points    Time 4    Period Weeks    Status New             PT Long Term Goals - 10/29/20 1105      PT LONG TERM GOAL #1   Title She will be indpendent with all HEP issued    Baseline independent with initial HEP    Time 6    Period Weeks    Status New      PT LONG TERM GOAL #2   Title She will improve BERG to 50/56    Time 10    Period Weeks    Status New      PT LONG TERM GOAL #3   Title She will walk with least device 1/2 mile at home    Time 10    Period Weeks    Status New      PT LONG TERM GOAL #4   Title FOTO score improved to 58% to demo percieved improvement    Time 10    Period Weeks    Status New      PT LONG TERM GOAL #5   Title She will report no falls in past 4 weeks    Time 10    Period Weeks    Status New                 Plan - 11/14/20 1120    Clinical Impression Statement Thlough unsteady she did well with balance activity/exercise with min LOB but she reported leg fatigue  and needed 2-3 rest breaks.    PT Treatment/Interventions Manual techniques;Patient/family education;Therapeutic exercise;Therapeutic activities;Passive range of motion;Electrical Stimulation;Moist Heat    PT Next Visit Plan progress strength and endurance    balance    PT Home Exercise Plan Bridge , SLR, KTC, LTR, LAQ, piriformis stretch    Consulted and Agree with Plan of Care Patient           Patient will benefit from skilled therapeutic intervention in order to improve the  following deficits and impairments:  Pain, Difficulty walking, Decreased activity tolerance, Decreased strength, Decreased balance  Visit Diagnosis: Muscle weakness (generalized)  Unsteadiness on feet  Chronic bilateral low back pain without sciatica     Problem List Patient Active Problem List   Diagnosis Date Noted  . Need for influenza vaccination 10/09/2020  . Fatty liver 10/09/2020  . Chronic bilateral low back pain without sciatica  10/09/2020  . Pulmonary nodule 10/09/2020  . Coronary artery disease involving native heart without angina pectoris 10/09/2020  . Hyperlipidemia 10/09/2020  . Fall 10/09/2020  . Need for shingles vaccine 10/09/2020  . Leg weakness, bilateral 09/11/2020  . Shaking 09/11/2020  . DOE (dyspnea on exertion) 09/11/2020  . Snoring 09/11/2020  . Decreased pedal pulses 09/11/2020  . Lower extremity edema 05/21/2020  . Thrombophlebitis 05/21/2020  . Estrogen deficiency 02/23/2020  . Impaired fasting blood sugar 02/23/2020  . Left leg swelling 02/23/2020  . Vertigo 06/29/2019  . History of alcohol abuse 06/29/2019  . Insomnia 09/21/2018  . Abnormal thyroid blood test 09/21/2018  . Advanced directives, counseling/discussion 08/05/2017  . Post-menopausal 08/05/2017  . History of fall 08/05/2017  . Coronary artery disease, non-occlusive 08/05/2017  . Hypokalemia 09/14/2016  . Encounter for health maintenance examination in adult 10/30/2015  . Vaccine counseling 10/30/2015  . Screening for breast cancer 10/30/2015  . Chewing tobacco use 06/18/2015  . Dyslipidemia 06/18/2015  . Family history of premature CAD 01/10/2015  . GERD (gastroesophageal reflux disease) 04/28/2012  . Essential hypertension, benign 09/21/2011    Caprice Red  PT 11/14/2020, 12:08 PM  [facility] [address] [place], [place], [address] Phone: [phone]   Fax:  [phone]  Name: [name] MRN: [id number] Date of Birth: [birth year]

## 2020-11-15 ENCOUNTER — Ambulatory Visit: Payer: Medicare HMO | Admitting: Cardiology

## 2020-11-19 ENCOUNTER — Ambulatory Visit: Payer: Medicare HMO

## 2020-11-25 ENCOUNTER — Ambulatory Visit: Payer: Medicare HMO | Admitting: Cardiology

## 2020-11-26 ENCOUNTER — Ambulatory Visit: Payer: Medicare HMO

## 2020-11-26 ENCOUNTER — Other Ambulatory Visit: Payer: Self-pay

## 2020-11-26 DIAGNOSIS — R2681 Unsteadiness on feet: Secondary | ICD-10-CM | POA: Diagnosis not present

## 2020-11-26 DIAGNOSIS — G8929 Other chronic pain: Secondary | ICD-10-CM | POA: Diagnosis not present

## 2020-11-26 DIAGNOSIS — M545 Low back pain, unspecified: Secondary | ICD-10-CM | POA: Diagnosis not present

## 2020-11-26 DIAGNOSIS — M6281 Muscle weakness (generalized): Secondary | ICD-10-CM

## 2020-11-26 NOTE — Therapy (Signed)
[facility] Outpatient Rehabilitation [facility] [address] [place], [place], [address] Phone: [phone]   Fax:  [phone]  Physical Therapy Treatment  Patient Details  Name: [name] MRN: [id number] Date of Birth: Jun 12, 1951 Referring Provider (PT): Crosby Oyster, [place]   Encounter Date: 11/26/2020   PT End of Session - 11/26/20 1039    Visit Number 5    Number of Visits 10    Date for PT Re-Evaluation 01/03/21    Authorization Type [employer] MCR    PT Start Time 1040    PT Stop Time 1125    PT Time Calculation (min) 45 min    Activity Tolerance Patient tolerated treatment well    Behavior During Therapy [facility] for tasks assessed/performed           Past Medical History:  Diagnosis Date  . Allergic rhinitis, cause unspecified   . Arthritis   . Chewing tobacco use   . Coronary artery disease, non-occlusive December 2006  . Dyslipidemia   . Essential hypertension    previously uncontrolled, now well-controlled; no evidence of renal artery stenosis  . GERD (gastroesophageal reflux disease)   . H/O echocardiogram 12/2014   60-65 % EF, mild basal hypertrophy focally at septum, otherwise normal  . Heart murmur 12/2014   mild focal hypertrophy of septum, EF 60-65%, trivial aortic regurgitation  . History of cardiac catheterization December 2006   for exertional chest pain: Nonobstructive CAD  . History of migraine headaches    not an issues as of 07/2017  . Onychomycosis 05/2015  . Pneumococcal vaccine refused 05/2015    Past Surgical History:  Procedure Laterality Date  . ABDOMINAL HYSTERECTOMY     PARTIAL (benign, for bleeding), still has ovaries  . CAROTID ANGIOGRAM  2005  . COLONOSCOPY  2009   Dr. Loreta Ave  . ESOPHAGOGASTRODUODENOSCOPY  2009   Dr. Loreta Ave  . EYE SURGERY     new lenses 07/2017  . LAPAROSCOPY  12/02   ovarian cyst  . LEFT HEART CATH AND CORONARY ANGIOGRAPHY  12/06   Dr. Clarene Duke; no significant CAD. Normal LV function with EF of  60-65%. Also noted no RAS.  Marland Kitchen NM MYOVIEW LTD  12/2014   LOW RISK.  EF 63%.  Fixed Small size mild intensity distal anteroapical defect suggestive of breast attenuation.  No reversibility. = Breast attenuation.  NO INFARCT OR ISCHEMIA.  Marland Kitchen TRANSTHORACIC ECHOCARDIOGRAM  12/2014   EF 60 to 65%.  Normal wall motion and no valvular lesions.  Normal LV filling pressures.    There were no vitals filed for this visit.   Subjective Assessment - 11/26/20 1049    Subjective Not a good day. I know I say that all the time.  Got a har spasm in LT leg getting out of bed.  May be due to water pills.              [facility] PT Assessment - 11/26/20 0001      Observation/Other Assessments   Focus on Therapeutic Outcomes (FOTO)  49%                             [facility] Adult PT Treatment/Exercise - 11/26/20 0001      Ambulation/Gait   Assistive device Straight cane    Gait Pattern Decreased stride length;Decreased trunk rotation;Wide base of support    Gait Comments 190 total  and 160 Ft in 2 min  Neuro Re-ed    Neuro Re-ed Details  in bars tandem walking and stance , eyes closed and side steps and exagerated heel strike RT and LT  with use of the bars.    Then no bars with eyes closes an dstaggered stance  , exagerated heel strike , hip ER  step RT and LT .  all with contact guard with belt   Then side stepping no UE support      Lumbar Exercises: Aerobic   Nustep UE/LE L4  6 min      Knee/Hip Exercises: Standing   Other Standing Knee Exercises in bars side steps RT /Lt 30 feet , backward walking 30 feet  all with yellow band  then moster walk x 45 feet yellow band.  at ankles      Knee/Hip Exercises: Seated   Sit to Sand 2 sets;5 reps   12 sec  then 1 set 10 reps     Knee/Hip Exercises: Supine   Bridges Both;20 reps    Straight Leg Raises Right;Left;2 sets;10 reps      Knee/Hip Exercises: Sidelying   Hip ABduction Right;Left;2 sets;10 reps                    PT  Short Term Goals - 11/26/20 1100      PT SHORT TERM GOAL #1   Title She will be independent with initial HEP    Status Achieved      PT SHORT TERM GOAL #3   Title She will walk 2 blocks with assist as needed at home    Baseline 190 feet today  < than  a block    Status On-going      PT SHORT TERM GOAL #4   Title FOTO score improved by 5 points    Baseline improved by 9 points    Status Achieved             PT Long Term Goals - 10/29/20 1105      PT LONG TERM GOAL #1   Title She will be indpendent with all HEP issued    Baseline independent with initial HEP    Time 6    Period Weeks    Status New      PT LONG TERM GOAL #2   Title She will improve BERG to 50/56    Time 10    Period Weeks    Status New      PT LONG TERM GOAL #3   Title She will walk with least device 1/2 mile at home    Time 10    Period Weeks    Status New      PT LONG TERM GOAL #4   Title FOTO score improved to 58% to demo percieved improvement    Time 10    Period Weeks    Status New      PT LONG TERM GOAL #5   Title She will report no falls in past 4 weeks    Time 10    Period Weeks    Status New                 Plan - 11/26/20 1039    Clinical Impression Statement FOTO score improved to 49%.  Overall better but still unsteady and endurance low.  pain not interfering with session. Cont to push walking distance and  balance / strength    PT Treatment/Interventions Manual techniques;Patient/family education;Therapeutic exercise;Therapeutic activities;Passive range of  motion;Electrical Stimulation;Moist Heat    PT Next Visit Plan progress strength and endurance    balance   ADD bands for LE strength    PT Home Exercise Plan Bridge , SLR, KTC, LTR, LAQ, piriformis stretch    Consulted and Agree with Plan of Care Patient           Patient will benefit from skilled therapeutic intervention in order to improve the following deficits and impairments:  Pain, Difficulty walking,  Decreased activity tolerance, Decreased strength, Decreased balance  Visit Diagnosis: Unsteadiness on feet  Muscle weakness (generalized)     Problem List Patient Active Problem List   Diagnosis Date Noted  . Need for influenza vaccination 10/09/2020  . Fatty liver 10/09/2020  . Chronic bilateral low back pain without sciatica 10/09/2020  . Pulmonary nodule 10/09/2020  . Coronary artery disease involving native heart without angina pectoris 10/09/2020  . Hyperlipidemia 10/09/2020  . Fall 10/09/2020  . Need for shingles vaccine 10/09/2020  . Leg weakness, bilateral 09/11/2020  . Shaking 09/11/2020  . DOE (dyspnea on exertion) 09/11/2020  . Snoring 09/11/2020  . Decreased pedal pulses 09/11/2020  . Lower extremity edema 05/21/2020  . Thrombophlebitis 05/21/2020  . Estrogen deficiency 02/23/2020  . Impaired fasting blood sugar 02/23/2020  . Left leg swelling 02/23/2020  . Vertigo 06/29/2019  . History of alcohol abuse 06/29/2019  . Insomnia 09/21/2018  . Abnormal thyroid blood test 09/21/2018  . Advanced directives, counseling/discussion 08/05/2017  . Post-menopausal 08/05/2017  . History of fall 08/05/2017  . Coronary artery disease, non-occlusive 08/05/2017  . Hypokalemia 09/14/2016  . Encounter for health maintenance examination in adult 10/30/2015  . Vaccine counseling 10/30/2015  . Screening for breast cancer 10/30/2015  . Chewing tobacco use 06/18/2015  . Dyslipidemia 06/18/2015  . Family history of premature CAD 01/10/2015  . GERD (gastroesophageal reflux disease) 04/28/2012  . Essential hypertension, benign 09/21/2011    Caprice Red  PT 11/26/2020, 11:40 AM  [facility] [address] [place], [place], [address] Phone: [phone]   Fax:  [phone]  Name: SITA MANGEN MRN: [id number] Date of Birth: 27-Aug-1951

## 2020-11-28 ENCOUNTER — Other Ambulatory Visit: Payer: Self-pay

## 2020-11-28 ENCOUNTER — Encounter: Payer: Self-pay | Admitting: Physical Therapy

## 2020-11-28 ENCOUNTER — Ambulatory Visit: Payer: Medicare HMO | Attending: Medical | Admitting: Physical Therapy

## 2020-11-28 DIAGNOSIS — M6281 Muscle weakness (generalized): Secondary | ICD-10-CM | POA: Diagnosis not present

## 2020-11-28 DIAGNOSIS — R2681 Unsteadiness on feet: Secondary | ICD-10-CM | POA: Diagnosis not present

## 2020-11-28 DIAGNOSIS — G8929 Other chronic pain: Secondary | ICD-10-CM | POA: Diagnosis not present

## 2020-11-28 DIAGNOSIS — M545 Low back pain, unspecified: Secondary | ICD-10-CM | POA: Insufficient documentation

## 2020-11-28 NOTE — Therapy (Signed)
[facility] Outpatient Rehabilitation [facility] [address] [place], [place], [address] Phone: [phone]   Fax:  [phone]  Physical Therapy Treatment  Patient Details  Name: [name] MRN: [id number] Date of Birth: 04-21-1951 Referring Provider (PT): Crosby Oyster, [place]   Encounter Date: 11/28/2020   PT End of Session - 11/28/20 1108    Visit Number 6    Number of Visits 10    Date for PT Re-Evaluation 01/03/21    Authorization Type [employer] MCR    PT Start Time 1051    PT Stop Time 1135    PT Time Calculation (min) 44 min    Activity Tolerance Patient tolerated treatment well    Behavior During Therapy [facility] for tasks assessed/performed           Past Medical History:  Diagnosis Date   Allergic rhinitis, cause unspecified    Arthritis    Chewing tobacco use    Coronary artery disease, non-occlusive December 2006   Dyslipidemia    Essential hypertension    previously uncontrolled, now well-controlled; no evidence of renal artery stenosis   GERD (gastroesophageal reflux disease)    H/O echocardiogram 12/2014   60-65 % EF, mild basal hypertrophy focally at septum, otherwise normal   Heart murmur 12/2014   mild focal hypertrophy of septum, EF 60-65%, trivial aortic regurgitation   History of cardiac catheterization December 2006   for exertional chest pain: Nonobstructive CAD   History of migraine headaches    not an issues as of 07/2017   Onychomycosis 05/2015   Pneumococcal vaccine refused 05/2015    Past Surgical History:  Procedure Laterality Date   ABDOMINAL HYSTERECTOMY     PARTIAL (benign, for bleeding), still has ovaries   CAROTID ANGIOGRAM  2005   COLONOSCOPY  2009   Dr. Loreta Ave   ESOPHAGOGASTRODUODENOSCOPY  2009   Dr. Loreta Ave   EYE SURGERY     new lenses 07/2017   LAPAROSCOPY  12/02   ovarian cyst   LEFT HEART CATH AND CORONARY ANGIOGRAPHY  12/06   Dr. Clarene Duke; no significant CAD. Normal LV function with EF of  60-65%. Also noted no RAS.   NM MYOVIEW LTD  12/2014   LOW RISK.  EF 63%.  Fixed Small size mild intensity distal anteroapical defect suggestive of breast attenuation.  No reversibility. = Breast attenuation.  NO INFARCT OR ISCHEMIA.   TRANSTHORACIC ECHOCARDIOGRAM  12/2014   EF 60 to 65%.  Normal wall motion and no valvular lesions.  Normal LV filling pressures.    There were no vitals filed for this visit.   Subjective Assessment - 11/28/20 1102    Subjective My balance is off today.  I wish I could get up and go.  My Lt leg is tight and cramping, about 6/10.    Currently in Pain? Yes    Pain Score 6     Pain Location Leg    Pain Orientation Left;Anterior;Posterior    Pain Descriptors / Indicators Tightness    Pain Type Chronic pain    Pain Onset More than a month ago    Pain Frequency Intermittent    Aggravating Factors  activity    Pain Relieving Factors stretching    Effect of Pain on Daily Activities limits her activity, shopping    Multiple Pain Sites No                [facility] Adult PT Treatment/Exercise - 11/28/20 0001      Lumbar Exercises:  Aerobic   Nustep 7 min LE UE and LE       Lumbar Exercises: Seated   LAQ on Chair Limitations green band x 15    bilateral    Sit to Stand 10 reps    Sit to Stand Limitations 3 sets  With lowering mat height      Knee/Hip Exercises: Stretches   Active Hamstring Stretch Both;2 reps;3 reps;30 seconds    Piriformis Stretch Both;5 reps    Piriformis Stretch Limitations push pull legs crossed                      PT Short Term Goals - 11/26/20 1100      PT SHORT TERM GOAL #1   Title She will be independent with initial HEP    Status Achieved      PT SHORT TERM GOAL #3   Title She will walk 2 blocks with assist as needed at home    Baseline 190 feet today  < than  a block    Status On-going      PT SHORT TERM GOAL #4   Title FOTO score improved by 5 points    Baseline improved by 9 points    Status Achieved              PT Long Term Goals - 10/29/20 1105      PT LONG TERM GOAL #1   Title She will be indpendent with all HEP issued    Baseline independent with initial HEP    Time 6    Period Weeks    Status New      PT LONG TERM GOAL #2   Title She will improve BERG to 50/56    Time 10    Period Weeks    Status New      PT LONG TERM GOAL #3   Title She will walk with least device 1/2 mile at home    Time 10    Period Weeks    Status New      PT LONG TERM GOAL #4   Title FOTO score improved to 58% to demo percieved improvement    Time 10    Period Weeks    Status New      PT LONG TERM GOAL #5   Title She will report no falls in past 4 weeks    Time 10    Period Weeks    Status New                 Plan - 11/28/20 1113    Clinical Impression Statement Patient with mild gait instability epecially after Nustep.  Unable to feel a stretch in anterior hip in standing. Legs fatigued but able to complete exercises.    PT Treatment/Interventions Manual techniques;Patient/family education;Therapeutic exercise;Therapeutic activities;Passive range of motion;Electrical Stimulation;Moist Heat    PT Next Visit Plan progress strength and endurance    balance   ADD bands for LE strength    PT Home Exercise Plan Bridge , SLR, KTC, LTR, LAQ, piriformis stretch    [id number]    Consulted and Agree with Plan of Care Patient           Patient will benefit from skilled therapeutic intervention in order to improve the following deficits and impairments:  Pain, Difficulty walking, Decreased activity tolerance, Decreased strength, Decreased balance  Visit Diagnosis: Unsteadiness on feet  Muscle weakness (generalized)  Chronic bilateral low back pain without sciatica  Problem List Patient Active Problem List   Diagnosis Date Noted   Need for influenza vaccination 10/09/2020   Fatty liver 10/09/2020   Chronic bilateral low back pain without sciatica 10/09/2020    Pulmonary nodule 10/09/2020   Coronary artery disease involving native heart without angina pectoris 10/09/2020   Hyperlipidemia 10/09/2020   Fall 10/09/2020   Need for shingles vaccine 10/09/2020   Leg weakness, bilateral 09/11/2020   Shaking 09/11/2020   DOE (dyspnea on exertion) 09/11/2020   Snoring 09/11/2020   Decreased pedal pulses 09/11/2020   Lower extremity edema 05/21/2020   Thrombophlebitis 05/21/2020   Estrogen deficiency 02/23/2020   Impaired fasting blood sugar 02/23/2020   Left leg swelling 02/23/2020   Vertigo 06/29/2019   History of alcohol abuse 06/29/2019   Insomnia 09/21/2018   Abnormal thyroid blood test 09/21/2018   Advanced directives, counseling/discussion 08/05/2017   Post-menopausal 08/05/2017   History of fall 08/05/2017   Coronary artery disease, non-occlusive 08/05/2017   Hypokalemia 09/14/2016   Encounter for health maintenance examination in adult 10/30/2015   Vaccine counseling 10/30/2015   Screening for breast cancer 10/30/2015   Chewing tobacco use 06/18/2015   Dyslipidemia 06/18/2015   Family history of premature CAD 01/10/2015   GERD (gastroesophageal reflux disease) 04/28/2012   Essential hypertension, benign 09/21/2011    [name] 11/28/2020, 2:36 PM  [facility] Outpatient Rehabilitation [facility] [address] [place], [place], [address] Phone: [phone]   Fax:  [phone]  Name: [name] MRN: [id number] Date of Birth: 1951-09-20  Karie Mainland, PT 11/28/20 2:36 PM Phone: [phone] Fax: [phone]

## 2020-12-01 DIAGNOSIS — R310 Gross hematuria: Secondary | ICD-10-CM | POA: Diagnosis not present

## 2020-12-03 ENCOUNTER — Ambulatory Visit: Payer: Medicare HMO

## 2020-12-05 ENCOUNTER — Ambulatory Visit: Payer: Medicare HMO

## 2020-12-10 ENCOUNTER — Ambulatory Visit: Payer: Medicare HMO

## 2020-12-13 ENCOUNTER — Encounter: Payer: Self-pay | Admitting: Physical Therapy

## 2020-12-13 ENCOUNTER — Ambulatory Visit: Payer: Medicare HMO | Admitting: Physical Therapy

## 2020-12-13 ENCOUNTER — Other Ambulatory Visit: Payer: Self-pay

## 2020-12-13 DIAGNOSIS — M6281 Muscle weakness (generalized): Secondary | ICD-10-CM

## 2020-12-13 DIAGNOSIS — G8929 Other chronic pain: Secondary | ICD-10-CM | POA: Diagnosis not present

## 2020-12-13 DIAGNOSIS — R2681 Unsteadiness on feet: Secondary | ICD-10-CM | POA: Diagnosis not present

## 2020-12-13 DIAGNOSIS — M545 Low back pain, unspecified: Secondary | ICD-10-CM | POA: Diagnosis not present

## 2020-12-13 NOTE — Therapy (Signed)
[facility] Outpatient Rehabilitation [facility] [address] [place], [place], [address] Phone: [phone]   Fax:  [phone]  Physical Therapy Treatment  Patient Details  Name: [name] MRN: [id number] Date of Birth: 1951-06-16 Referring Provider (PT): Crosby Oyster, [place]   Encounter Date: 12/13/2020   PT End of Session - 12/13/20 1043    Visit Number 7    Number of Visits 10    Date for PT Re-Evaluation 01/03/21    Authorization Type [employer] MCR    PT Start Time 1035    PT Stop Time 1120    PT Time Calculation (min) 45 min    Activity Tolerance Patient tolerated treatment well    Behavior During Therapy [facility] for tasks assessed/performed           Past Medical History:  Diagnosis Date   Allergic rhinitis, cause unspecified    Arthritis    Chewing tobacco use    Coronary artery disease, non-occlusive December 2006   Dyslipidemia    Essential hypertension    previously uncontrolled, now well-controlled; no evidence of renal artery stenosis   GERD (gastroesophageal reflux disease)    H/O echocardiogram 12/2014   60-65 % EF, mild basal hypertrophy focally at septum, otherwise normal   Heart murmur 12/2014   mild focal hypertrophy of septum, EF 60-65%, trivial aortic regurgitation   History of cardiac catheterization December 2006   for exertional chest pain: Nonobstructive CAD   History of migraine headaches    not an issues as of 07/2017   Onychomycosis 05/2015   Pneumococcal vaccine refused 05/2015    Past Surgical History:  Procedure Laterality Date   ABDOMINAL HYSTERECTOMY     PARTIAL (benign, for bleeding), still has ovaries   CAROTID ANGIOGRAM  2005   COLONOSCOPY  2009   Dr. Loreta Ave   ESOPHAGOGASTRODUODENOSCOPY  2009   Dr. Loreta Ave   EYE SURGERY     new lenses 07/2017   LAPAROSCOPY  12/02   ovarian cyst   LEFT HEART CATH AND CORONARY ANGIOGRAPHY  12/06   Dr. Clarene Duke; no significant CAD. Normal LV function with EF of  60-65%. Also noted no RAS.   NM MYOVIEW LTD  12/2014   LOW RISK.  EF 63%.  Fixed Small size mild intensity distal anteroapical defect suggestive of breast attenuation.  No reversibility. = Breast attenuation.  NO INFARCT OR ISCHEMIA.   TRANSTHORACIC ECHOCARDIOGRAM  12/2014   EF 60 to 65%.  Normal wall motion and no valvular lesions.  Normal LV filling pressures.    There were no vitals filed for this visit.   Subjective Assessment - 12/13/20 1039    Subjective I believe I'm getting better.  She had a UTI and missed last week.L Leg tight today, only a 2/10. Tight from the cramps.    Currently in Pain? Yes    Pain Score 2     Pain Location Leg    Pain Orientation Left;Anterior    Pain Descriptors / Indicators Cramping    Pain Onset More than a month ago    Pain Frequency Intermittent    Aggravating Factors  putting shoes and socks on    Pain Relieving Factors stretching                [facility] Adult PT Treatment/Exercise - 12/13/20 0001      Ambulation/Gait   Ambulation Distance (Feet) 185 Feet    Assistive device Small based quad cane    Gait Comments small steps, quick  pace, min back pain      Knee/Hip Exercises: Stretches   Hip Flexor Stretch Left;3 reps    Hip Flexor Stretch Limitations sidelying as well    Piriformis Stretch Left;3 reps      Knee/Hip Exercises: Aerobic   Nustep UE and LE for 7 min , L4      Knee/Hip Exercises: Seated   Long Arc Quad Strengthening;Both;1 set;20 reps;Weights    Long Arc Quad Weight 3 lbs.    Sit to Sand 3 sets;10 reps      Knee/Hip Exercises: Supine   Heel Slides Limitations physioball x 20    Bridges Both;2 sets    Bridges Limitations 10 reps, ball 1 set , added 5 lbs and then press out for core , UE s     Knee/Hip Exercises: Sidelying   Hip ABduction Strengthening;Both;2 sets    Clams x 15 each side                  PT Education - 12/13/20 1125    Education Details plan of care, strengthening    Person(s)  Educated Patient    Methods Explanation;Demonstration    Comprehension Verbalized understanding;Returned demonstration            PT Short Term Goals - 11/26/20 1100      PT SHORT TERM GOAL #1   Title She will be independent with initial HEP    Status Achieved      PT SHORT TERM GOAL #3   Title She will walk 2 blocks with assist as needed at home    Baseline 190 feet today  < than  a block    Status On-going      PT SHORT TERM GOAL #4   Title FOTO score improved by 5 points    Baseline improved by 9 points    Status Achieved             PT Long Term Goals - 10/29/20 1105      PT LONG TERM GOAL #1   Title She will be indpendent with all HEP issued    Baseline independent with initial HEP    Time 6    Period Weeks    Status New      PT LONG TERM GOAL #2   Title She will improve BERG to 50/56    Time 10    Period Weeks    Status New      PT LONG TERM GOAL #3   Title She will walk with least device 1/2 mile at home    Time 10    Period Weeks    Status New      PT LONG TERM GOAL #4   Title FOTO score improved to 58% to demo percieved improvement    Time 10    Period Weeks    Status New      PT LONG TERM GOAL #5   Title She will report no falls in past 4 weeks    Time 10    Period Weeks    Status New                 Plan - 12/13/20 1044    Clinical Impression Statement Antasia was sick, had UTI and missed appts but continues to feel better overall, showing less discomfort/cramping in LLE.  She walks with small steps, does not flex at the ankle or knee as she should. She continues to have weakness in hips,  tight in quads, hip flexors.  Unable to perform an effective bridge. Pt will not be able to come week after Christmas she she is watching her grandkids but will plan to extend her POC into January.    PT Treatment/Interventions Manual techniques;Patient/family education;Therapeutic exercise;Therapeutic activities;Passive range of motion;Electrical  Stimulation;Moist Heat    PT Next Visit Plan progress strength and endurance    balance   ADD bands for LE strength    PT Home Exercise Plan Bridge , SLR, KTC, LTR, LAQ, piriformis stretch    [id number]    Consulted and Agree with Plan of Care Patient           Patient will benefit from skilled therapeutic intervention in order to improve the following deficits and impairments:  Pain,Difficulty walking,Decreased activity tolerance,Decreased strength,Decreased balance  Visit Diagnosis: Unsteadiness on feet  Muscle weakness (generalized)  Chronic bilateral low back pain without sciatica     Problem List Patient Active Problem List   Diagnosis Date Noted   Need for influenza vaccination 10/09/2020   Fatty liver 10/09/2020   Chronic bilateral low back pain without sciatica 10/09/2020   Pulmonary nodule 10/09/2020   Coronary artery disease involving native heart without angina pectoris 10/09/2020   Hyperlipidemia 10/09/2020   Fall 10/09/2020   Need for shingles vaccine 10/09/2020   Leg weakness, bilateral 09/11/2020   Shaking 09/11/2020   DOE (dyspnea on exertion) 09/11/2020   Snoring 09/11/2020   Decreased pedal pulses 09/11/2020   Lower extremity edema 05/21/2020   Thrombophlebitis 05/21/2020   Estrogen deficiency 02/23/2020   Impaired fasting blood sugar 02/23/2020   Left leg swelling 02/23/2020   Vertigo 06/29/2019   History of alcohol abuse 06/29/2019   Insomnia 09/21/2018   Abnormal thyroid blood test 09/21/2018   Advanced directives, counseling/discussion 08/05/2017   Post-menopausal 08/05/2017   History of fall 08/05/2017   Coronary artery disease, non-occlusive 08/05/2017   Hypokalemia 09/14/2016   Encounter for health maintenance examination in adult 10/30/2015   Vaccine counseling 10/30/2015   Screening for breast cancer 10/30/2015   Chewing tobacco use 06/18/2015   Dyslipidemia 06/18/2015   Family history of premature CAD  01/10/2015   GERD (gastroesophageal reflux disease) 04/28/2012   Essential hypertension, benign 09/21/2011    Mckaylah Bettendorf 12/13/2020, 11:34 AM  [facility] [address] [place], [place], [address] Phone: [phone]   Fax:  [phone]  Name: LAYCE SPRUNG MRN: [id number] Date of Birth: 1951/04/24  Karie Mainland, PT 12/13/20 11:35 AM Phone: [phone] Fax: [phone]

## 2020-12-17 ENCOUNTER — Other Ambulatory Visit: Payer: Self-pay

## 2020-12-17 ENCOUNTER — Ambulatory Visit: Payer: Medicare HMO

## 2020-12-17 DIAGNOSIS — R2681 Unsteadiness on feet: Secondary | ICD-10-CM | POA: Diagnosis not present

## 2020-12-17 DIAGNOSIS — M6281 Muscle weakness (generalized): Secondary | ICD-10-CM

## 2020-12-17 DIAGNOSIS — M545 Low back pain, unspecified: Secondary | ICD-10-CM | POA: Diagnosis not present

## 2020-12-17 DIAGNOSIS — G8929 Other chronic pain: Secondary | ICD-10-CM | POA: Diagnosis not present

## 2020-12-17 NOTE — Therapy (Addendum)
[place], [place], [address] Phone: [phone]   Fax:  [phone]  Physical Therapy Treatment/ Discharge  Patient Details  Name: [name] MRN: [id number] Date of Birth: [birth year] Referring Provider (PT): Chana Bode, [place]   Encounter Date: 12/17/2020   PT End of Session - 12/17/20 1123    Visit Number 8    Number of Visits 10    Date for PT Re-Evaluation 01/03/21    Authorization Type [employer] MCR    PT Start Time 1120    PT Stop Time 1202    PT Time Calculation (min) 42 min    Activity Tolerance Patient tolerated treatment well    Behavior During Therapy [facility] for tasks assessed/performed           Past Medical History:  Diagnosis Date  . Allergic rhinitis, cause unspecified   . Arthritis   . Chewing tobacco use   . Coronary artery disease, non-occlusive December 2006  . Dyslipidemia   . Essential hypertension    previously uncontrolled, now well-controlled; no evidence of renal artery stenosis  . GERD (gastroesophageal reflux disease)   . H/O echocardiogram 12/2014   60-65 % EF, mild basal hypertrophy focally at septum, otherwise normal  . Heart murmur 12/2014   mild focal hypertrophy of septum, EF 60-65%, trivial aortic regurgitation  . History of cardiac catheterization December 2006   for exertional chest pain: Nonobstructive CAD  . History of migraine headaches    not an issues as of 07/2017  . Onychomycosis 05/2015  . Pneumococcal vaccine refused 05/2015    Past Surgical History:  Procedure Laterality Date  . ABDOMINAL HYSTERECTOMY     PARTIAL (benign, for bleeding), still has ovaries  . CAROTID ANGIOGRAM  2005  . COLONOSCOPY  2009   Dr. Collene Mares  . ESOPHAGOGASTRODUODENOSCOPY  2009   Dr. Collene Mares  . EYE SURGERY     new lenses 07/2017  . LAPAROSCOPY  12/02   ovarian cyst  . LEFT HEART CATH AND CORONARY ANGIOGRAPHY  12/06   Dr. Rex Kras; no significant CAD. Normal LV function with  EF of 60-65%. Also noted no RAS.  Marland Kitchen NM MYOVIEW LTD  12/2014   LOW RISK.  EF 63%.  Fixed Small size mild intensity distal anteroapical defect suggestive of breast attenuation.  No reversibility. = Breast attenuation.  NO INFARCT OR ISCHEMIA.  Marland Kitchen TRANSTHORACIC ECHOCARDIOGRAM  12/2014   EF 60 to 65%.  Normal wall motion and no valvular lesions.  Normal LV filling pressures.    There were no vitals filed for this visit.   Subjective Assessment - 12/17/20 1128    Subjective She believes her legs are stronger.                             [facility] PT Treatment/Exercise - 12/17/20 0001      Lumbar Exercises: Aerobic   Nustep 6 min L 4      Lumbar Exercises: Standing   Heel Raises 20 reps      Lumbar Exercises: Sidelying   Clam Right;Left;15 reps    Clam Limitations red band    Hip Abduction Right;Left;10 reps    Hip Abduction Weights (lbs) green band      Knee/Hip Exercises: Stretches   Piriformis Stretch Left;3 reps      Knee/Hip Exercises: Seated   Long Arc Quad Strengthening;Both;1 set;20 reps;Weights    Long Arc [employer]  4 lbs.    Knee/Hip Flexion with 4# on each ankle and side stepping  RT and LT , guarded by Pt with belt.    Sit to Sand 10 reps;2 sets      Knee/Hip Exercises: Supine   Heel Slides Limitations physioball x 20    Bridges Both;2 sets;10 reps    Bridges Limitations legs on ball      Knee/Hip Exercises: Sidelying   Clams x 15 each side                  PT Education - 12/17/20 1202    Education Details HEP    Person(s) Educated Patient    Methods Explanation;Tactile cues;Verbal cues;Handout    Comprehension Verbalized understanding;Returned demonstration            PT Short Term Goals - 11/26/20 1100      PT SHORT TERM GOAL #1   Title She will be independent with initial HEP    Status Achieved      PT SHORT TERM GOAL #3   Title She will walk 2 blocks with assist as needed at home    Baseline 190 feet today  < than   a block    Status On-going      PT SHORT TERM GOAL #4   Title FOTO score improved by 5 points    Baseline improved by 9 points    Status Achieved             PT Long Term Goals - 10/29/20 1105      PT LONG TERM GOAL #1   Title She will be indpendent with all HEP issued    Baseline independent with initial HEP    Time 6    Period Weeks    Status New      PT LONG TERM GOAL #2   Title She will improve BERG to 50/56    Time 10    Period Weeks    Status New      PT LONG TERM GOAL #3   Title She will walk with least device 1/2 mile at home    Time 10    Period Weeks    Status New      PT LONG TERM GOAL #4   Title FOTO score improved to 58% to demo percieved improvement    Time 10    Period Weeks    Status New      PT LONG TERM GOAL #5   Title She will report no falls in past 4 weeks    Time 10    Period Weeks    Status New                 Plan - 12/17/20 1123    Clinical Impression Statement Still unsteady on feet.   tolerated weight to legs with exercise.   Added to HEP.  progressing but not clear she will improve to be off cane    PT Treatment/Interventions Manual techniques;Patient/family education;Therapeutic exercise;Therapeutic activities;Passive range of motion;Electrical Stimulation;Moist Heat    PT Next Visit Plan progress strength and endurance    balance   ADD bands for LE strength    PT Home Exercise Plan Bridge , SLR, KTC, LTR, LAQ, piriformis stretch   side clam and hip abduction  [id number]    Consulted and Agree with Plan of Care Patient           Patient will benefit from skilled therapeutic intervention in  order to improve the following deficits and impairments:  Pain,Difficulty walking,Decreased activity tolerance,Decreased strength,Decreased balance  Visit Diagnosis: Muscle weakness (generalized)  Unsteadiness on feet  Chronic bilateral low back pain without sciatica     Problem List Patient Active Problem List   Diagnosis  Date Noted  . Need for influenza vaccination 10/09/2020  . Fatty liver 10/09/2020  . Chronic bilateral low back pain without sciatica 10/09/2020  . Pulmonary nodule 10/09/2020  . Coronary artery disease involving native heart without angina pectoris 10/09/2020  . Hyperlipidemia 10/09/2020  . Fall 10/09/2020  . Need for shingles vaccine 10/09/2020  . Leg weakness, bilateral 09/11/2020  . Shaking 09/11/2020  . DOE (dyspnea on exertion) 09/11/2020  . Snoring 09/11/2020  . Decreased pedal pulses 09/11/2020  . Lower extremity edema 05/21/2020  . Thrombophlebitis 05/21/2020  . Estrogen deficiency 02/23/2020  . Impaired fasting blood sugar 02/23/2020  . Left leg swelling 02/23/2020  . Vertigo 06/29/2019  . History of alcohol abuse 06/29/2019  . Insomnia 09/21/2018  . Abnormal thyroid blood test 09/21/2018  . Advanced directives, counseling/discussion 08/05/2017  . Post-menopausal 08/05/2017  . History of fall 08/05/2017  . Coronary artery disease, non-occlusive 08/05/2017  . Hypokalemia 09/14/2016  . Encounter for health maintenance examination in adult 10/30/2015  . Vaccine counseling 10/30/2015  . Screening for breast cancer 10/30/2015  . Chewing tobacco use 06/18/2015  . Dyslipidemia 06/18/2015  . Family history of premature CAD 01/10/2015  . GERD (gastroesophageal reflux disease) 04/28/2012  . Essential hypertension, benign 09/21/2011    Darrel Hoover  PT 12/17/2020, 12:07 PM  [place] [facility] [address] [place], [place], [address] Phone: [phone]   Fax:  [phone]  Name: [name] MRN: [id number] Date of Birth: 04-23-1951  PHYSICAL THERAPY DISCHARGE SUMMARY  Visits from Start of Care: 8  Current functional level related to goals / functional outcomes: Unknown as she did not return after this visit primarily to covid exposure and she canceled all visits and did not return .  She would need another  referral to return.    Remaining deficits: Unknown   Education / Equipment: HEP  Plan:                                                    Patient goals were not met. Patient is being discharged due to not returning since the last visit.  ?????    Pearson Forster PT 03/08/21

## 2020-12-17 NOTE — Patient Instructions (Signed)
Side lye clam and hip abduction x 10-20 reps red band every other day RT/LT

## 2020-12-27 ENCOUNTER — Other Ambulatory Visit: Payer: Self-pay | Admitting: Medical

## 2020-12-31 ENCOUNTER — Ambulatory Visit: Payer: Medicare HMO

## 2021-01-07 ENCOUNTER — Ambulatory Visit: Payer: Medicare HMO | Admitting: Physical Therapy

## 2021-01-09 ENCOUNTER — Ambulatory Visit: Payer: Medicare HMO | Admitting: Physical Therapy

## 2021-01-14 ENCOUNTER — Encounter: Payer: Medicare HMO | Admitting: Cardiology

## 2021-01-14 ENCOUNTER — Telehealth: Payer: Self-pay

## 2021-01-14 NOTE — Progress Notes (Incomplete)
Virtual Visit via Video Note   This visit type was conducted due to national recommendations for restrictions regarding the COVID-19 Pandemic (e.g. social distancing) in an effort to limit this patient's exposure and mitigate transmission in our community.  Due to her co-morbid illnesses, this patient is at least at moderate risk for complications without adequate follow up.  This format is felt to be most appropriate for this patient at this time.  All issues noted in this document were discussed and addressed.  A limited physical exam was performed with this format.  Please refer to the patient's chart for her consent to telehealth for [facility].  {Did you have to convert this VIDEO visit to AUDIO only?                     [id number]}   Patient has given verbal permission to conduct this visit via virtual appointment and to bill insurance 01/14/2021 1:24 PM     Evaluation Performed:  Follow-up visit  Date:  01/14/2021   ID:  [name], DOB 07/10/1951, MRN [id number]  Patient Location: Home Provider Location: Other:  Hospital Office  PCP:  Barbara Canavan, PA-C  Cardiologist:  Barbara Lemma, MD  Electrophysiologist:  None .   Chief Complaint:  No chief complaint on file.  History of Present Illness:    [name] is a 70 y.o. female with PMH notable for Nonobstructive CAD (family history of premature CAD-negative stress test and disease by cath), HTN and Leg Edema who presents via audio/video conferencing for a telehealth visit today.  Problem List Items Addressed This Visit    Essential hypertension, benign (Chronic)   Dyslipidemia, goal LDL below 70 (Chronic)   Coronary artery disease, non-occlusive (Chronic)   Coronary artery disease involving native heart without angina pectoris - Primary (Chronic)      [name] was last seen September 19, 2020 for reconsultation history of hypertension and leg swelling.  Noted being quite sedentary since the onset  of COVID.  Immobilized and deconditioned.  Generalized leg weakness and swelling.  Legs give out when walking.  No chest pain with some exertional dyspnea.  Gained 20 pounds due to sedentary lifestyle.  Persistent hypertension-blood pressures 1 40-1 55 systolic.  Left leg worse than right leg edema.  Not wearing support stockings. --> ROS: Malaise/fatigue, cough and exertional dyspnea.  Leg swelling.  Joint pain.  Leg weakness.  Added valsartan 80 mg-in the a.m. along with HCTZ; moved amlodipine to p.m.  Coronary calcium score => PCP started Crestor at least 3 days a week, and aspirin daily.  Hospitalizations:  . None   Recent - Interim CV studies:   The following studies were reviewed today: . Coronary Calcium Score 07/02/2020: Calcium score 564.  Also noted tiny pulmonary nodules 2 to 3 mm in size.  Likely benign.  Mild PA dilation.  Hepatic steatosis.  Inerval History   [name] ***  Cardiovascular ROS: {roscv:310661}   ROS:  Please see the history of present illness.    The patient {does/does not:200015} have symptoms concerning for COVID-19 infection (fever, chills, cough, or new shortness of breath).  ROS  The patient {ACTION; IS/IS WYO:[id number]} practicing social distancing.  Past Medical History:  Diagnosis Date  . Allergic rhinitis, cause unspecified   . Arthritis   . Chewing tobacco use   . Coronary artery disease, non-occlusive December 2006  . Dyslipidemia   . Essential hypertension    previously  uncontrolled, now well-controlled; no evidence of renal artery stenosis  . GERD (gastroesophageal reflux disease)   . H/O echocardiogram 12/2014   60-65 % EF, mild basal hypertrophy focally at septum, otherwise normal  . Heart murmur 12/2014   mild focal hypertrophy of septum, EF 60-65%, trivial aortic regurgitation  . History of cardiac catheterization December 2006   for exertional chest pain: Nonobstructive CAD  . History of migraine headaches    not an issues  as of 07/2017  . Onychomycosis 05/2015  . Pneumococcal vaccine refused 05/2015   Past Surgical History:  Procedure Laterality Date  . ABDOMINAL HYSTERECTOMY     PARTIAL (benign, for bleeding), still has ovaries  . CAROTID ANGIOGRAM  2005  . COLONOSCOPY  2009   Dr. Loreta Ave  . ESOPHAGOGASTRODUODENOSCOPY  2009   Dr. Loreta Ave  . EYE SURGERY     new lenses 07/2017  . LAPAROSCOPY  12/02   ovarian cyst  . LEFT HEART CATH AND CORONARY ANGIOGRAPHY  12/06   Dr. Clarene Duke; no significant CAD. Normal LV function with EF of 60-65%. Also noted no RAS.  Marland Kitchen NM MYOVIEW LTD  12/2014   LOW RISK.  EF 63%.  Fixed Small size mild intensity distal anteroapical defect suggestive of breast attenuation.  No reversibility. = Breast attenuation.  NO INFARCT OR ISCHEMIA.  Marland Kitchen TRANSTHORACIC ECHOCARDIOGRAM  12/2014   EF 60 to 65%.  Normal wall motion and no valvular lesions.  Normal LV filling pressures.     No outpatient medications have been marked as taking for the 01/14/21 encounter (Appointment) with Barbara Lex, MD.     Allergies:   Morphine and related   Social History   Tobacco Use  . Smoking status: Never Smoker  . Smokeless tobacco: Current User    Types: Snuff  . Tobacco comment: hhas been using since she was a teenager  Vaping Use  . Vaping Use: Never used  Substance Use Topics  . Alcohol use: Yes    Alcohol/week: 30.0 standard drinks    Types: 30 Cans of beer per week    Comment: 12 drinks per week.  . Drug use: No     Family Hx: The patient's family history includes Cancer in her maternal aunt; Diabetes in her maternal aunt, maternal grandmother, and maternal uncle; Heart disease in her brother, brother, brother, maternal aunt, maternal grandmother, maternal uncle, mother, and paternal uncle; Heart disease (age of onset: 18) in her father; Hypertension in her mother.   Labs/Other Tests and Data Reviewed:    EKG:  No ECG reviewed.  Recent Labs: 09/11/2020: ALT 24; BNP 23.0; BUN 8; Creatinine,  Ser 0.56; Hemoglobin 14.8; Platelets 202; Potassium 3.3; Sodium 137; TSH 1.020   Recent Lipid Panel Lab Results  Component Value Date/Time   CHOL 201 (H) 02/23/2020 10:01 AM   TRIG 122 02/23/2020 10:01 AM   HDL 61 02/23/2020 10:01 AM   CHOLHDL 3.3 02/23/2020 10:01 AM   CHOLHDL 2.0 08/05/2017 10:17 AM   LDLCALC 118 (H) 02/23/2020 10:01 AM    Wt Readings from Last 3 Encounters:  10/09/20 169 lb (76.7 kg)  09/19/20 167 lb (75.8 kg)  09/11/20 168 lb 12.8 oz (76.6 kg)     Objective:    Vital Signs:    VITAL SIGNS:  reviewed Pleasant elderly female in no acute distress. A&O x 3.  Normal Mood & Affect Non-labored respirations   ASSESSMENT & PLAN:    Problem List Items Addressed This Visit    Essential  hypertension, benign (Chronic)   Dyslipidemia, goal LDL below 70 (Chronic)   Coronary artery disease, non-occlusive (Chronic)   Coronary artery disease involving native heart without angina pectoris - Primary (Chronic)      COVID-19 Education: The signs and symptoms of COVID-19 were discussed with the patient and how to seek care for testing (follow up with PCP or arrange E-visit).   The importance of social distancing was discussed today.  Time:   Today, I have spent *** minutes with the patient with telehealth technology discussing the above problems.     Medication Adjustments/Labs and Tests Ordered: Current medicines are reviewed at length with the patient today.  Concerns regarding medicines are outlined above.   There are no Patient Instructions on file for this visit.   Signed, Barbara Lemma, MD  01/14/2021 1:24 PM     [facility]

## 2021-01-14 NOTE — Telephone Encounter (Signed)
I attempted to call [name] 3x no answer. I left a voicemail for her to return the call to the office to reschedule her appointment.

## 2021-01-15 NOTE — Progress Notes (Signed)
Patient was scheduled, unable to contact via telephone.  Erroneous encounter.

## 2021-01-16 ENCOUNTER — Other Ambulatory Visit: Payer: Medicare HMO

## 2021-01-22 ENCOUNTER — Telehealth: Payer: Self-pay | Admitting: Medical

## 2021-01-22 NOTE — Telephone Encounter (Signed)
Pt called to let you know that he tested positive  She was tested at [facility] [employer] she is not sick, she just has a cold

## 2021-01-23 NOTE — Telephone Encounter (Signed)
Sent patient a message on my chart

## 2021-01-23 NOTE — Telephone Encounter (Signed)
Offer appt to discuss,  can give these initial recommmendations/print/my chart, etc.   [facility] [phone]  General recommendations if you have respiratory symptoms: We recommend you rest, hydrate well with water and clear fluids throughout the day such as water, soup broth, ice chips, or possibly pedialyte or G2 no sugar gatorade.   You can use Tylenol over the counter for pain or fever every 4 - 6 hours You can use over the counter Delsym or mucinex DM for cough unless your provider prescribed a cough medication already. You can use over the counter Emetrol over the counter for nausea.    Consider EmergenC Immune plus vitamin pack over the counter which contains extra vitamin C, vitamin D, and zinc.  If you are having trouble breathing, if you are very weak, have high fever 103 or higher consistently despite Tylenol, or uncontrollable nausea and vomiting, then call or go to the emergency department.    Covid symptoms such as fatigue and cough can linger over 2 weeks, even after the initial fever, aches, chills, and other initial symptoms.   Self Quarantine and Isolation: Log onto [employer] for up to date quarantine recommendations.   [email or web address]   If you test Covid +, regardless of vaccination status:  Stay home for 5 days. If you have no symptoms or your symptoms are resolving after 5 days, then you can leave your house. Continue to wear a mask around others for 5 additional days. If you have a fever, continue to stay home until your fever resolves.  This could take 7-10 days from onset of symptoms or longer in some cases. If you have lots of coughing, sneezing, and significant runny nose and congestion, continue to stay at home until your symptoms are resolving   If you were exposed to someone with Covid: If you: Have been boosted OR Completed the primary series of Pfizer or Moderna  vaccine within the last 6 months OR Completed the primary series of J&J vaccine within the last 2 months  Wear a mask around others for 10 days.  Test on day 5, if possible.   If you test positive on day 5 or more after exposure, and no symptoms, then wear a mask around others for 10 days  If you test positive and have symptoms, then follow the positive covid result isolation recommendations above If you test negative and have no symptoms on day 5 after exposure, then you may end isolation and be around others    If you were exposed to someone with Covid: If you: Completed the primary series of Pfizer or Moderna vaccine over 6 months ago and are not boosted OR Completed the primary series of J&J over 2 months ago and are not boosted OR Are unvaccinated  Stay home for 5 days. After that continue to wear a mask around others for 5 additional days. If you can't quarantine you must wear a mask for 10 days. Test on day 5 if possible. If you test positive on day 5 or more after exposure, and no symptoms, then wear a mask around others for 10 days  If you test positive and have symptoms, then follow the positive covid result isolation recommendations above If you test negative and have no symptoms on day 5 after exposure, then you can end isolation but wear a mask around others for 5 more days   If you test Covid negative, but have respiratory symptoms:  Continue to wear a mask around others for  5 additional days. If you have a fever, continue to stay home until your fever resolves.   If you have lots of coughing, sneezing, and significant runny nose and congestion, continue to stay at home until your symptoms are resolving   Isolation means avoiding contact with people as much as possible.   Particularly in your house, isolate your self from others in a separate room, wear a mask when possible in the room, particularly if coughing a lot.   Have others bring food, water, medications,  etc., to your door, but avoid direct contact with your household contacts during this time to avoid spreading the infection to them.   If you have a separate bathroom and living quarters during the next 2 weeks away from others, that would be preferable.    If you can't completely isolate, then wear a mask, wash hands frequently with soap and water for at least 15 seconds, minimize close contact with others, and have a friend or family member check regularly from a distance to make sure you are not getting seriously worse.     You should not be going out in public, should not be going to stores, to work or other public places until all your symptoms have resolved.  One of the goals is to limit spread to high risk people; people that are older and elderly, people with multiple health issues like diabetes, heart disease, lung disease, and anybody that has weakened immune systems such as people with cancer or on immunosuppressive therapy.

## 2021-02-18 ENCOUNTER — Other Ambulatory Visit: Payer: Self-pay | Admitting: Medical

## 2021-02-21 ENCOUNTER — Telehealth (INDEPENDENT_AMBULATORY_CARE_PROVIDER_SITE_OTHER): Payer: Medicare HMO | Admitting: Medical

## 2021-02-21 ENCOUNTER — Encounter: Payer: Self-pay | Admitting: Medical

## 2021-02-21 VITALS — Ht 62.0 in | Wt 160.0 lb

## 2021-02-21 DIAGNOSIS — M25662 Stiffness of left knee, not elsewhere classified: Secondary | ICD-10-CM | POA: Diagnosis not present

## 2021-02-21 DIAGNOSIS — M25562 Pain in left knee: Secondary | ICD-10-CM

## 2021-02-21 DIAGNOSIS — M79604 Pain in right leg: Secondary | ICD-10-CM

## 2021-02-21 DIAGNOSIS — Z9989 Dependence on other enabling machines and devices: Secondary | ICD-10-CM | POA: Diagnosis not present

## 2021-02-21 DIAGNOSIS — R202 Paresthesia of skin: Secondary | ICD-10-CM

## 2021-02-21 DIAGNOSIS — Z9181 History of falling: Secondary | ICD-10-CM | POA: Diagnosis not present

## 2021-02-21 DIAGNOSIS — G8929 Other chronic pain: Secondary | ICD-10-CM | POA: Insufficient documentation

## 2021-02-21 DIAGNOSIS — R2681 Unsteadiness on feet: Secondary | ICD-10-CM | POA: Diagnosis not present

## 2021-02-21 DIAGNOSIS — M5410 Radiculopathy, site unspecified: Secondary | ICD-10-CM | POA: Diagnosis not present

## 2021-02-21 DIAGNOSIS — M79605 Pain in left leg: Secondary | ICD-10-CM

## 2021-02-21 DIAGNOSIS — R0989 Other specified symptoms and signs involving the circulatory and respiratory systems: Secondary | ICD-10-CM

## 2021-02-21 MED ORDER — HYDROCODONE-ACETAMINOPHEN 5-325 MG PO TABS
1.0000 | ORAL_TABLET | Freq: Two times a day (BID) | ORAL | 0 refills | Status: DC | PRN
Start: 2021-02-21 — End: 2022-01-15

## 2021-02-21 NOTE — Addendum Note (Signed)
Addended by: Victorio Palm on: 02/21/2021 04:33 PM   Modules accepted: Orders

## 2021-02-21 NOTE — Progress Notes (Signed)
Done

## 2021-02-21 NOTE — Progress Notes (Signed)
Subjective: Chief Complaint  Patient presents with  . Leg Pain    Left leg pain has worsened after PT. Went for several weeks but it didn't help. ABI was done but no clots.    Virtual for ongoing pain in legs. Mrs. Raphael has a history of coronary artery disease, hypertension, dyslipidemia, and prior history of heavy alcohol use.  I saw her back in September and October for leg pain and leg weakness.  At that time we referred to physical therapy which did help the leg shaking that she was having weakness.  She still has not been able to completely go without a cane yesterday.  They were working towards that goal as well  At this point though her main concern is pain.  She has ongoing pain in the left knee and left leg, currently the left knee will not fully bend and she is having to drag the leg.  She denies specific knee swelling but the knee does hurt.  Her toes on both feet feel numb at times.  She denies back pain.  She denies other numbness tingling or weakness.  No swelling currently.  She still gets some shakiness and weakness of the legs but has improved significantly with the therapy she did in the fall  She fell twice this past year landing on her left side but no recent falls.  No other recent injury.  No other back or leg pain.  The ongoing pain in weakness in her legs is causing her some distress and anxiety  No prior sleep apnea test.  Snores. No witnessed apnea.   lately not awakening rested.  She has daytime somnolence.     No other aggravating or relieving factors. No other complaint.  Past Medical History:  Diagnosis Date  . Allergic rhinitis, cause unspecified   . Arthritis   . Chewing tobacco use   . Coronary artery disease, non-occlusive December 2006  . Dyslipidemia   . Essential hypertension    previously uncontrolled, now well-controlled; no evidence of renal artery stenosis  . GERD (gastroesophageal reflux disease)   . H/O echocardiogram 12/2014   60-65 % EF,  mild basal hypertrophy focally at septum, otherwise normal  . Heart murmur 12/2014   mild focal hypertrophy of septum, EF 60-65%, trivial aortic regurgitation  . History of cardiac catheterization December 2006   for exertional chest pain: Nonobstructive CAD  . History of migraine headaches    not an issues as of 07/2017  . Onychomycosis 05/2015  . Pneumococcal vaccine refused 05/2015   Current Outpatient Medications on File Prior to Visit  Medication Sig Dispense Refill  . amLODipine (NORVASC) 5 MG tablet TAKE 1 TABLET (5 MG TOTAL) BY MOUTH DAILY. 90 tablet 0  . aspirin EC 81 MG tablet Take 1 tablet (81 mg total) by mouth daily. 90 tablet 3  . hydrochlorothiazide (HYDRODIURIL) 25 MG tablet Take 1 tablet (25 mg total) by mouth daily. 90 tablet 3  . rosuvastatin (CRESTOR) 10 MG tablet Take 1 tablet (10 mg total) by mouth daily. 90 tablet 3  . valsartan (DIOVAN) 80 MG tablet Take 1 tablet (80 mg total) by mouth daily. 90 tablet 3  . potassium chloride SA (KLOR-CON) 20 MEQ tablet TAKE 1 TABLET TWICE DAILY (Patient not taking: Reported on 02/21/2021) 120 tablet 0   No current facility-administered medications on file prior to visit.   ROS as in subjective    Objective: Ht 5\' 2"  (1.575 m)   Wt 160 lb (72.6  kg)   LMP  (LMP Unknown)   BMI 29.26 kg/m   BP Readings from Last 3 Encounters:  10/09/20 124/80  09/19/20 (!) 158/82  09/11/20 (!) 170/82   Wt Readings from Last 3 Encounters:  02/21/21 160 lb (72.6 kg)  10/09/20 169 lb (76.7 kg)  09/19/20 167 lb (75.8 kg)   Gen: wd, wn, tearful at times, seems anxious or upset about her health Otherwise not examined as this was a virtual consult On her last visit in person she had 4/5 strength of bilateral legs, decreased pulses, was unsteady on her feet and using a cane   Assessment: Encounter Diagnoses  Name Primary?  . Chronic pain of both lower extremities Yes  . Chronic pain of left knee   . Unsteadiness on feet   . Paresthesia    . Decreased range of motion of left knee   . Radicular leg pain   . History of fall   . Does mobilize using cane   . Decreased pedal pulses      Plan: I last spoke to her in the fall 2021 about her legs, we had her follow-up with cardiology given swelling and other cardiac concerns as well as referral to physical therapy for her legs.  She has seen improvement in shakiness and weakness of the legs with therapy.  She continues to have pain in her legs specifically left leg and left knee.  Decreased range of motion knee.  I will refer her for ABIs to screen for vascular disease  I reviewed her venous ultrasound from February 2021 that showed thrombophlebitis but no DVT or other abnormality  Regarding her toe numbness & unsteadiness, radicular pain-she could have some underlying radicular issue from the lumbar spine or history of nerve damage and neuropathy from prior heavy alcohol use  Given the ongoing nature of her pain I will go ahead and refer to orthopedics for further evaluation and treatment   She can continue over-the-counter ibuprofen 400 mg twice daily short-term along with ice therapy to the knee 20 minutes on and off, relative rest.  She can also try topical diclofenac over-the-counter as an alternative to the ibuprofen is we will limit NSAID use  She can use the Norco hydrocodone short-term for flareup of pain as needed   Ajla was seen today for leg pain.  Diagnoses and all orders for this visit:  Chronic pain of both lower extremities -     AMB referral to orthopedics  Chronic pain of left knee -     AMB referral to orthopedics  Unsteadiness on feet -     VAS [place] ABI WITH/WO TBI; Future -     AMB referral to orthopedics  Paresthesia -     VAS [place] ABI WITH/WO TBI; Future -     AMB referral to orthopedics  Decreased range of motion of left knee  Radicular leg pain  History of fall  Does mobilize using cane  Decreased pedal pulses -     VAS [place] ABI WITH/WO  TBI; Future -     AMB referral to orthopedics  Other orders -     HYDROcodone-acetaminophen (NORCO) 5-325 MG tablet; Take 1 tablet by mouth 2 (two) times daily as needed.   Pending referrals

## 2021-02-24 ENCOUNTER — Ambulatory Visit: Payer: Medicare HMO | Admitting: Medical

## 2021-02-26 ENCOUNTER — Other Ambulatory Visit: Payer: Self-pay

## 2021-02-26 ENCOUNTER — Ambulatory Visit (INDEPENDENT_AMBULATORY_CARE_PROVIDER_SITE_OTHER): Payer: Medicare HMO

## 2021-02-26 ENCOUNTER — Ambulatory Visit: Payer: Medicare HMO | Admitting: Orthopaedic Surgery

## 2021-02-26 DIAGNOSIS — G8929 Other chronic pain: Secondary | ICD-10-CM

## 2021-02-26 DIAGNOSIS — M25562 Pain in left knee: Secondary | ICD-10-CM

## 2021-02-26 MED ORDER — LIDOCAINE HCL 1 % IJ SOLN
2.0000 mL | INTRAMUSCULAR | Status: AC | PRN
  Administered 2021-02-26: 2 mL

## 2021-02-26 MED ORDER — METHYLPREDNISOLONE ACETATE 40 MG/ML IJ SUSP
40.0000 mg | INTRAMUSCULAR | Status: AC | PRN
  Administered 2021-02-26: 40 mg via INTRA_ARTICULAR

## 2021-02-26 MED ORDER — BUPIVACAINE HCL 0.5 % IJ SOLN
2.0000 mL | INTRAMUSCULAR | Status: AC | PRN
Start: 2021-02-26 — End: 2021-02-26
  Administered 2021-02-26: 2 mL via INTRA_ARTICULAR

## 2021-02-26 NOTE — Progress Notes (Signed)
Office Visit Note   Patient: [name]           Date of Birth: Oct 08, 1951           MRN: [id number] Visit Date: 02/26/2021              Requested by: Jac Canavan, PA-C [address] [place],  [place] [address] PCP: Jac Canavan, PA-C   Assessment & Plan: Visit Diagnoses:  1. Chronic pain of left knee     Plan: Impression is left knee osteoarthritis some evidence of prior pseudogout.  Based on discussion of treatment options she would like to try cortisone injection today.  She will also work on weight loss and strengthening home exercises.  Follow-up as needed.  Follow-Up Instructions: Return if symptoms worsen or fail to improve.   Orders:  Orders Placed This Encounter  Procedures  . XR KNEE 3 VIEW LEFT   No orders of the defined types were placed in this encounter.     Procedures: Large Joint Inj: L knee on 02/26/2021 9:03 AM Details: 22 G needle Medications: 2 mL bupivacaine 0.5 %; 2 mL lidocaine 1 %; 40 mg methylPREDNISolone acetate 40 MG/ML Outcome: tolerated well, no immediate complications Patient was prepped and draped in the usual sterile fashion.       Clinical Data: No additional findings.   Subjective: Chief Complaint  Patient presents with  . Left Knee - Pain    [name] is a very pleasant 70 year old female comes in for evaluation of knee pain few months.  Denies any injuries.  She feels pain mainly in the mechanical symptoms.  Occasionally she has some swelling.  Pain is worse with standing.  She has gained about 15 pounds in the last year.  She is prescribed Norco by PCP but she did not like how it made her feel.  Denies any numbness and tingling.   Review of Systems  Constitutional: Negative.   HENT: Negative.   Eyes: Negative.   Respiratory: Negative.   Cardiovascular: Negative.   Endocrine: Negative.   Musculoskeletal: Negative.   Neurological: Negative.   Hematological: Negative.   Psychiatric/Behavioral: Negative.    All other systems reviewed and are negative.    Objective: Vital Signs: LMP  (LMP Unknown)   Physical Exam Vitals and nursing note reviewed.  Constitutional:      Appearance: She is well-developed and well-nourished.  HENT:     Head: Normocephalic and atraumatic.  Eyes:     Extraocular Movements: EOM normal.  Pulmonary:     Effort: Pulmonary effort is normal.  Abdominal:     Palpations: Abdomen is soft.  Musculoskeletal:     Cervical back: Neck supple.  Skin:    General: Skin is warm.     Capillary Refill: Capillary refill takes less than 2 seconds.  Neurological:     Mental Status: She is alert and oriented to person, place, and time.  Psychiatric:        Mood and Affect: Mood and affect normal.        Behavior: Behavior normal.        Thought Content: Thought content normal.        Judgment: Judgment normal.     Ortho Exam Left knee shows a trace effusion.  Collaterals and cruciates are stable.  Normal range of motion with mild crepitus. Specialty Comments:  No specialty comments available.  Imaging: XR KNEE 3 VIEW LEFT  Result Date: 02/26/2021 Mild to moderate osteoarthritis.  Periarticular spurring.    PMFS History: Patient Active Problem List   Diagnosis Date Noted  . Chronic pain of both lower extremities 02/21/2021  . Chronic pain of left knee 02/21/2021  . Unsteadiness on feet 02/21/2021  . Paresthesia 02/21/2021  . Decreased range of motion of left knee 02/21/2021  . Radicular leg pain 02/21/2021  . Does mobilize using cane 02/21/2021  . Need for influenza vaccination 10/09/2020  . Fatty liver 10/09/2020  . Chronic bilateral low back pain without sciatica 10/09/2020  . Pulmonary nodule 10/09/2020  . Coronary artery disease involving native heart without angina pectoris 10/09/2020  . Fall 10/09/2020  . Need for shingles vaccine 10/09/2020  . Leg weakness, bilateral 09/11/2020  . Shaking 09/11/2020  . DOE (dyspnea on exertion) 09/11/2020  .  Snoring 09/11/2020  . Decreased pedal pulses 09/11/2020  . Lower extremity edema 05/21/2020  . Thrombophlebitis 05/21/2020  . Estrogen deficiency 02/23/2020  . Impaired fasting blood sugar 02/23/2020  . Left leg swelling 02/23/2020  . Vertigo 06/29/2019  . History of alcohol abuse 06/29/2019  . Insomnia 09/21/2018  . Abnormal thyroid blood test 09/21/2018  . Advanced directives, counseling/discussion 08/05/2017  . Post-menopausal 08/05/2017  . History of fall 08/05/2017  . Coronary artery disease, non-occlusive 08/05/2017  . Hypokalemia 09/14/2016  . Encounter for health maintenance examination in adult 10/30/2015  . Vaccine counseling 10/30/2015  . Screening for breast cancer 10/30/2015  . Chewing tobacco use 06/18/2015  . Dyslipidemia, goal LDL below 70 06/18/2015  . Family history of premature CAD 01/10/2015  . GERD (gastroesophageal reflux disease) 04/28/2012  . Essential hypertension, benign 09/21/2011   Past Medical History:  Diagnosis Date  . Allergic rhinitis, cause unspecified   . Arthritis   . Chewing tobacco use   . Coronary artery disease, non-occlusive December 2006  . Dyslipidemia   . Essential hypertension    previously uncontrolled, now well-controlled; no evidence of renal artery stenosis  . GERD (gastroesophageal reflux disease)   . H/O echocardiogram 12/2014   60-65 % EF, mild basal hypertrophy focally at septum, otherwise normal  . Heart murmur 12/2014   mild focal hypertrophy of septum, EF 60-65%, trivial aortic regurgitation  . History of cardiac catheterization December 2006   for exertional chest pain: Nonobstructive CAD  . History of migraine headaches    not an issues as of 07/2017  . Onychomycosis 05/2015  . Pneumococcal vaccine refused 05/2015    Family History  Problem Relation Age of Onset  . Heart disease Mother        CHF  . Hypertension Mother   . Heart disease Father 48       MI  . Heart disease Brother   . Heart disease Maternal Aunt    . Diabetes Maternal Aunt   . Cancer Maternal Aunt        cervical  . Heart disease Maternal Uncle   . Diabetes Maternal Uncle   . Heart disease Maternal Grandmother        CHF  . Diabetes Maternal Grandmother   . Heart disease Paternal Uncle   . Heart disease Brother   . Heart disease Brother     Past Surgical History:  Procedure Laterality Date  . ABDOMINAL HYSTERECTOMY     PARTIAL (benign, for bleeding), still has ovaries  . CAROTID ANGIOGRAM  2005  . COLONOSCOPY  2009   Dr. Loreta Ave  . ESOPHAGOGASTRODUODENOSCOPY  2009   Dr. Loreta Ave  . EYE SURGERY     new  lenses 07/2017  . LAPAROSCOPY  12/02   ovarian cyst  . LEFT HEART CATH AND CORONARY ANGIOGRAPHY  12/06   Dr. Clarene Duke; no significant CAD. Normal LV function with EF of 60-65%. Also noted no RAS.  Marland Kitchen NM MYOVIEW LTD  12/2014   LOW RISK.  EF 63%.  Fixed Small size mild intensity distal anteroapical defect suggestive of breast attenuation.  No reversibility. = Breast attenuation.  NO INFARCT OR ISCHEMIA.  Marland Kitchen TRANSTHORACIC ECHOCARDIOGRAM  12/2014   EF 60 to 65%.  Normal wall motion and no valvular lesions.  Normal LV filling pressures.   Social History   Occupational History  . 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 Smoking status: Never Smoker  . Smokeless tobacco: Current User    Types: Snuff  . Tobacco comment: hhas been using since she was a teenager  Vaping Use  . Vaping Use: Never used  Substance and Sexual Activity  . Alcohol use: Yes    Alcohol/week: 30.0 standard drinks    Types: 30 Cans of beer per week    Comment: 12 drinks per week.  . Drug use: No  . Sexual activity: Never    Birth control/protection: None

## 2021-02-27 ENCOUNTER — Ambulatory Visit: Payer: Medicare HMO | Admitting: Medical

## 2021-03-03 ENCOUNTER — Ambulatory Visit (HOSPITAL_COMMUNITY)
Admission: RE | Admit: 2021-03-03 | Discharge: 2021-03-03 | Disposition: A | Discharge status: Home / Self Care | Payer: Medicare HMO | Source: Ambulatory Visit | Attending: Medical | Admitting: Medical

## 2021-03-03 ENCOUNTER — Other Ambulatory Visit: Payer: Self-pay

## 2021-03-03 DIAGNOSIS — R202 Paresthesia of skin: Secondary | ICD-10-CM | POA: Insufficient documentation

## 2021-03-03 DIAGNOSIS — R2681 Unsteadiness on feet: Secondary | ICD-10-CM | POA: Diagnosis not present

## 2021-03-03 DIAGNOSIS — R0989 Other specified symptoms and signs involving the circulatory and respiratory systems: Secondary | ICD-10-CM | POA: Insufficient documentation

## 2021-03-10 ENCOUNTER — Telehealth: Payer: Self-pay | Admitting: Medical

## 2021-03-10 NOTE — Telephone Encounter (Signed)
Please call and see if she wants to resume physical therapy.  See note from the therapist where she canceled previous appointment due to Covid exposure.  If she plans to go back for the leg issues we sent her for then she will need a new referral from [place]

## 2021-03-10 NOTE — Telephone Encounter (Signed)
Awaiting patient response

## 2021-04-21 ENCOUNTER — Other Ambulatory Visit: Payer: Self-pay | Admitting: Medical

## 2021-06-10 ENCOUNTER — Other Ambulatory Visit: Payer: Self-pay | Admitting: Medical

## 2021-08-04 ENCOUNTER — Other Ambulatory Visit: Payer: Self-pay | Admitting: Medical

## 2021-08-23 ENCOUNTER — Telehealth: Payer: Self-pay

## 2021-08-23 NOTE — Telephone Encounter (Signed)
Called patient to schedule AWV. She called back and scheduled for today. Maryjean Morn, CMA

## 2021-08-23 NOTE — Telephone Encounter (Signed)
Patient was willing to schedule AWV virtual. After reviewing her chart she is scheduled for in person AWV with PCP in November. Will remove patient from list. Maryjean Morn, CMA

## 2021-09-05 ENCOUNTER — Ambulatory Visit: Payer: Medicare HMO | Admitting: Orthopaedic Surgery

## 2021-09-05 ENCOUNTER — Encounter: Payer: Self-pay | Admitting: Orthopaedic Surgery

## 2021-09-05 ENCOUNTER — Other Ambulatory Visit: Payer: Self-pay

## 2021-09-05 ENCOUNTER — Ambulatory Visit (INDEPENDENT_AMBULATORY_CARE_PROVIDER_SITE_OTHER): Payer: Medicare HMO

## 2021-09-05 VITALS — Ht 62.0 in | Wt 160.0 lb

## 2021-09-05 DIAGNOSIS — M1711 Unilateral primary osteoarthritis, right knee: Secondary | ICD-10-CM | POA: Diagnosis not present

## 2021-09-05 MED ORDER — DICLOFENAC SODIUM 75 MG PO TBEC: 75.0000 mg | DELAYED_RELEASE_TABLET | Freq: Two times a day (BID) | ORAL | 2 refills | Status: DC

## 2021-09-05 NOTE — Progress Notes (Signed)
Office Visit Note   Patient: [name]           Date of Birth: Feb 04, 1951           MRN: [id number] Visit Date: 09/05/2021              Requested by: Jac Canavan, PA-C [address] [place],  [place] [address] PCP: Jac Canavan, PA-C   Assessment & Plan: Visit Diagnoses:  1. Primary osteoarthritis of right knee     Plan: Impression is right knee DJD.  Patient is concerned about the posterior knee pain so we will obtain a Doppler to rule out DVT.  I attempted to perform cortisone injection and the patient could not tolerate the pain from the needle stick therefore we aborted the injection.  I sent a prescription for diclofenac.  Follow-Up Instructions: No follow-ups on file.   Orders:  Orders Placed This Encounter  Procedures   XR KNEE 3 VIEW RIGHT   VAS [place] LOWER EXTREMITY VENOUS (DVT)   Meds ordered this encounter  Medications   diclofenac (VOLTAREN) 75 MG EC tablet    Sig: Take 1 tablet (75 mg total) by mouth 2 (two) times daily.    Dispense:  30 tablet    Refill:  2      Procedures: No procedures performed   Clinical Data: No additional findings.   Subjective: Chief Complaint  Patient presents with   Right Knee - Pain   Left Knee - Pain    [name] comes in today for evaluation of the right knee pain.  She endorses posterior knee pain with giving way and popping and occasional swelling.  Denies any injuries.   Review of Systems  Constitutional: Negative.   HENT: Negative.    Eyes: Negative.   Respiratory: Negative.    Cardiovascular: Negative.   Endocrine: Negative.   Musculoskeletal: Negative.   Neurological: Negative.   Hematological: Negative.   Psychiatric/Behavioral: Negative.    All other systems reviewed and are negative.   Objective: Vital Signs: Ht 5\' 2"  (1.575 m)   Wt 160 lb (72.6 kg)   LMP  (LMP Unknown)   BMI 29.26 kg/m   Physical Exam Vitals and nursing note reviewed.  Constitutional:      Appearance: She  is well-developed.  Pulmonary:     Effort: Pulmonary effort is normal.  Skin:    General: Skin is warm.     Capillary Refill: Capillary refill takes less than 2 seconds.  Neurological:     Mental Status: She is alert and oriented to person, place, and time.  Psychiatric:        Behavior: Behavior normal.        Thought Content: Thought content normal.        Judgment: Judgment normal.    Ortho Exam  Right knee shows decreased range of motion and 2+ crepitus.  Collaterals and cruciates are stable.  No joint effusion.  No popliteal masses that I can appreciate.  Specialty Comments:  No specialty comments available.  Imaging: XR KNEE 3 VIEW RIGHT  Result Date: 09/05/2021 Moderate tricompartmental DJD.    PMFS History: Patient Active Problem List   Diagnosis Date Noted   Primary osteoarthritis of right knee 09/05/2021   Chronic pain of both lower extremities 02/21/2021   Chronic pain of left knee 02/21/2021   Unsteadiness on feet 02/21/2021   Paresthesia 02/21/2021   Decreased range of motion of left knee 02/21/2021   Radicular leg pain  02/21/2021   Does mobilize using cane 02/21/2021   Need for influenza vaccination 10/09/2020   Fatty liver 10/09/2020   Chronic bilateral low back pain without sciatica 10/09/2020   Pulmonary nodule 10/09/2020   Coronary artery disease involving native heart without angina pectoris 10/09/2020   Fall 10/09/2020   Need for shingles vaccine 10/09/2020   Leg weakness, bilateral 09/11/2020   Shaking 09/11/2020   DOE (dyspnea on exertion) 09/11/2020   Snoring 09/11/2020   Decreased pedal pulses 09/11/2020   Lower extremity edema 05/21/2020   Thrombophlebitis 05/21/2020   Estrogen deficiency 02/23/2020   Impaired fasting blood sugar 02/23/2020   Left leg swelling 02/23/2020   Vertigo 06/29/2019   History of alcohol abuse 06/29/2019   Insomnia 09/21/2018   Abnormal thyroid blood test 09/21/2018   Advanced directives, counseling/discussion  08/05/2017   Post-menopausal 08/05/2017   History of fall 08/05/2017   Coronary artery disease, non-occlusive 08/05/2017   Hypokalemia 09/14/2016   Encounter for health maintenance examination in adult 10/30/2015   Vaccine counseling 10/30/2015   Screening for breast cancer 10/30/2015   Chewing tobacco use 06/18/2015   Dyslipidemia, goal LDL below 70 06/18/2015   Family history of premature CAD 01/10/2015   GERD (gastroesophageal reflux disease) 04/28/2012   Essential hypertension, benign 09/21/2011   Past Medical History:  Diagnosis Date   Allergic rhinitis, cause unspecified    Arthritis    Chewing tobacco use    Coronary artery disease, non-occlusive December 2006   Dyslipidemia    Essential hypertension    previously uncontrolled, now well-controlled; no evidence of renal artery stenosis   GERD (gastroesophageal reflux disease)    H/O echocardiogram 12/2014   60-65 % EF, mild basal hypertrophy focally at septum, otherwise normal   Heart murmur 12/2014   mild focal hypertrophy of septum, EF 60-65%, trivial aortic regurgitation   History of cardiac catheterization December 2006   for exertional chest pain: Nonobstructive CAD   History of migraine headaches    not an issues as of 07/2017   Onychomycosis 05/2015   Pneumococcal vaccine refused 05/2015    Family History  Problem Relation Age of Onset   Heart disease Mother        CHF   Hypertension Mother    Heart disease Father 55       MI   Heart disease Brother    Heart disease Maternal Aunt    Diabetes Maternal Aunt    Cancer Maternal Aunt        cervical   Heart disease Maternal Uncle    Diabetes Maternal Uncle    Heart disease Maternal Grandmother        CHF   Diabetes Maternal Grandmother    Heart disease Paternal Uncle    Heart disease Brother    Heart disease Brother     Past Surgical History:  Procedure Laterality Date   ABDOMINAL HYSTERECTOMY     PARTIAL (benign, for bleeding), still has ovaries    CAROTID ANGIOGRAM  2005   COLONOSCOPY  2009   Dr. Loreta Ave   ESOPHAGOGASTRODUODENOSCOPY  2009   Dr. Loreta Ave   EYE SURGERY     new lenses 07/2017   LAPAROSCOPY  12/02   ovarian cyst   LEFT HEART CATH AND CORONARY ANGIOGRAPHY  12/06   Dr. Clarene Duke; no significant CAD. Normal LV function with EF of 60-65%. Also noted no RAS.   NM MYOVIEW LTD  12/2014   LOW RISK.  EF 63%.  Fixed Small size mild  intensity distal anteroapical defect suggestive of breast attenuation.  No reversibility. = Breast attenuation.  NO INFARCT OR ISCHEMIA.   TRANSTHORACIC ECHOCARDIOGRAM  12/2014   EF 60 to 65%.  Normal wall motion and no valvular lesions.  Normal LV filling pressures.   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   Drug use: No   Sexual activity: Never    Birth control/protection: None

## 2021-09-08 ENCOUNTER — Other Ambulatory Visit: Payer: Self-pay

## 2021-09-08 ENCOUNTER — Telehealth: Payer: Self-pay | Admitting: *Deleted

## 2021-09-08 ENCOUNTER — Ambulatory Visit (HOSPITAL_COMMUNITY)
Admission: RE | Admit: 2021-09-08 | Discharge: 2021-09-08 | Disposition: A | Discharge status: Home / Self Care | Payer: Medicare HMO | Source: Ambulatory Visit | Attending: Orthopaedic Surgery | Admitting: Orthopaedic Surgery

## 2021-09-08 DIAGNOSIS — M1711 Unilateral primary osteoarthritis, right knee: Secondary | ICD-10-CM | POA: Insufficient documentation

## 2021-09-08 DIAGNOSIS — M79604 Pain in right leg: Secondary | ICD-10-CM

## 2021-09-08 NOTE — Telephone Encounter (Signed)
Pt is aware of appt scheduled for [name] today 09/08/21 at [facility] at 3pm

## 2021-10-23 ENCOUNTER — Other Ambulatory Visit: Payer: Self-pay | Admitting: Cardiology

## 2021-11-07 ENCOUNTER — Other Ambulatory Visit: Payer: Self-pay | Admitting: Orthopaedic Surgery

## 2021-11-07 ENCOUNTER — Ambulatory Visit: Payer: Medicare HMO | Admitting: Medical

## 2022-01-15 ENCOUNTER — Encounter: Payer: Self-pay | Admitting: Medical

## 2022-01-15 ENCOUNTER — Telehealth: Payer: Self-pay | Admitting: Medical

## 2022-01-15 ENCOUNTER — Ambulatory Visit (INDEPENDENT_AMBULATORY_CARE_PROVIDER_SITE_OTHER): Payer: Medicare HMO | Admitting: Medical

## 2022-01-15 VITALS — BP 138/82 | HR 73 | Ht 62.0 in | Wt 173.2 lb

## 2022-01-15 DIAGNOSIS — R5383 Other fatigue: Secondary | ICD-10-CM

## 2022-01-15 DIAGNOSIS — R7301 Impaired fasting glucose: Secondary | ICD-10-CM | POA: Diagnosis not present

## 2022-01-15 DIAGNOSIS — Z0000 Encounter for general adult medical examination without abnormal findings: Secondary | ICD-10-CM

## 2022-01-15 DIAGNOSIS — K760 Fatty (change of) liver, not elsewhere classified: Secondary | ICD-10-CM | POA: Diagnosis not present

## 2022-01-15 DIAGNOSIS — Z7189 Other specified counseling: Secondary | ICD-10-CM | POA: Diagnosis not present

## 2022-01-15 DIAGNOSIS — Z9989 Dependence on other enabling machines and devices: Secondary | ICD-10-CM

## 2022-01-15 DIAGNOSIS — Z8249 Family history of ischemic heart disease and other diseases of the circulatory system: Secondary | ICD-10-CM

## 2022-01-15 DIAGNOSIS — Z23 Encounter for immunization: Secondary | ICD-10-CM | POA: Diagnosis not present

## 2022-01-15 DIAGNOSIS — Z720 Tobacco use: Secondary | ICD-10-CM | POA: Diagnosis not present

## 2022-01-15 DIAGNOSIS — Z1231 Encounter for screening mammogram for malignant neoplasm of breast: Secondary | ICD-10-CM | POA: Diagnosis not present

## 2022-01-15 DIAGNOSIS — I10 Essential (primary) hypertension: Secondary | ICD-10-CM | POA: Diagnosis not present

## 2022-01-15 DIAGNOSIS — E785 Hyperlipidemia, unspecified: Secondary | ICD-10-CM | POA: Diagnosis not present

## 2022-01-15 DIAGNOSIS — E2839 Other primary ovarian failure: Secondary | ICD-10-CM | POA: Diagnosis not present

## 2022-01-15 DIAGNOSIS — Z1211 Encounter for screening for malignant neoplasm of colon: Secondary | ICD-10-CM

## 2022-01-15 DIAGNOSIS — Z7185 Encounter for immunization safety counseling: Secondary | ICD-10-CM

## 2022-01-15 DIAGNOSIS — G8929 Other chronic pain: Secondary | ICD-10-CM

## 2022-01-15 DIAGNOSIS — M79604 Pain in right leg: Secondary | ICD-10-CM | POA: Diagnosis not present

## 2022-01-15 DIAGNOSIS — K219 Gastro-esophageal reflux disease without esophagitis: Secondary | ICD-10-CM

## 2022-01-15 DIAGNOSIS — Z79899 Other long term (current) drug therapy: Secondary | ICD-10-CM | POA: Diagnosis not present

## 2022-01-15 DIAGNOSIS — M79605 Pain in left leg: Secondary | ICD-10-CM

## 2022-01-15 DIAGNOSIS — I2510 Atherosclerotic heart disease of native coronary artery without angina pectoris: Secondary | ICD-10-CM | POA: Diagnosis not present

## 2022-01-15 DIAGNOSIS — R531 Weakness: Secondary | ICD-10-CM | POA: Insufficient documentation

## 2022-01-15 DIAGNOSIS — Z8639 Personal history of other endocrine, nutritional and metabolic disease: Secondary | ICD-10-CM | POA: Insufficient documentation

## 2022-01-15 DIAGNOSIS — R2681 Unsteadiness on feet: Secondary | ICD-10-CM

## 2022-01-15 DIAGNOSIS — Z780 Asymptomatic menopausal state: Secondary | ICD-10-CM | POA: Insufficient documentation

## 2022-01-15 DIAGNOSIS — F1011 Alcohol abuse, in remission: Secondary | ICD-10-CM

## 2022-01-15 NOTE — Progress Notes (Signed)
Subjective:    [name] is a 71 y.o. female who presents for Preventative Services visit and chronic medical problems/med check visit.    Chief Complaint  Patient presents with   fasting cpe    Fasting cpe. Flu shot today      Primary Care Provider Tysinger, Camelia Eng, PA-C here for primary care  Current Health Care Team: Dentist, none Eye doctor, none Dr. Glenetta Hew, cardiology Dr. Frankey Shown, orthopedics   Medical Services you may have received from other than [facility] providers in the past year (date may be approximate) Dr. Erlinda Hong- ortho Dr. Ellyn Hack- Cardio  Exercise Current exercise habits:  none    Nutrition/Diet Current diet: in general, a "healthy" diet    Depression Screen Depression screen [facility] 2/9 01/15/2022  Decreased Interest 1  Down, Depressed, Hopeless 1  PHQ - 2 Score 2  Altered sleeping 1  Tired, decreased energy 1  Change in appetite 0  Feeling bad or failure about yourself  2  Trouble concentrating 0  Moving slowly or fidgety/restless 0  Suicidal thoughts 0  PHQ-9 Score 6  Difficult doing work/chores Not difficult at all    Activities of Daily Living Screen/Functional Status Survey Is the patient deaf or have difficulty hearing?: No Does the patient have difficulty seeing, even when wearing glasses/contacts?: No Does the patient have difficulty concentrating, remembering, or making decisions?: No Does the patient have difficulty walking or climbing stairs?: Yes Does the patient have difficulty dressing or bathing?: No Does the patient have difficulty doing errands alone such as visiting a doctor's office or shopping?: No  Can patient draw a clock face showing 3:15 oclock, yes  Fall Risk Screen Fall Risk  01/15/2022 02/23/2020 09/21/2018 08/05/2017 10/30/2015  Falls in the past year? 0 0 No Yes No  Number falls in past yr: 0 - - 1 -  Injury with Fall? 0 - - Yes -  Risk for fall due to : No Fall Risks - - - -  Follow up Falls evaluation  completed - - - -    Gait Assessment: Normal gait observed  - no walking slowly with cane  Advanced directives Does patient have a [place]? No Does patient have a Living Will? No  Past Medical History:  Diagnosis Date   Allergic rhinitis, cause unspecified    Arthritis    Chewing tobacco use    Coronary artery disease, non-occlusive December 2006   Dyslipidemia    Essential hypertension    previously uncontrolled, now well-controlled; no evidence of renal artery stenosis   GERD (gastroesophageal reflux disease)    H/O echocardiogram 12/2014   60-65 % EF, mild basal hypertrophy focally at septum, otherwise normal   Heart murmur 12/2014   mild focal hypertrophy of septum, EF 60-65%, trivial aortic regurgitation   History of cardiac catheterization December 2006   for exertional chest pain: Nonobstructive CAD   History of migraine headaches    not an issues as of 07/2017   Onychomycosis 05/2015   Pneumococcal vaccine refused 05/2015    Past Surgical History:  Procedure Laterality Date   ABDOMINAL HYSTERECTOMY     PARTIAL (benign, for bleeding), still has ovaries   CAROTID ANGIOGRAM  2005   COLONOSCOPY  2009   Dr. Collene Mares   ESOPHAGOGASTRODUODENOSCOPY  2009   Dr. Collene Mares   EYE SURGERY     new lenses 07/2017   LAPAROSCOPY  12/02   ovarian cyst   LEFT HEART CATH AND  CORONARY ANGIOGRAPHY  12/06   Dr. Rex Kras; no significant CAD. Normal LV function with EF of 60-65%. Also noted no RAS.   NM MYOVIEW LTD  12/2014   LOW RISK.  EF 63%.  Fixed Small size mild intensity distal anteroapical defect suggestive of breast attenuation.  No reversibility. = Breast attenuation.  NO INFARCT OR ISCHEMIA.   TRANSTHORACIC ECHOCARDIOGRAM  12/2014   EF 60 to 65%.  Normal wall motion and no valvular lesions.  Normal LV filling pressures.    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   Drug use: No   Sexual activity: Never    Birth control/protection: None  Other Topics Concern   Not on file  Social History Narrative   Widowed 2016.  Lives with boyfriend of 7 years.  Was married 51 years with Judie Bonus, widowed. Still rides motorcycle.     Has been dipping snuff since she was a teenager.   She drinks 2-3 [place] [employer] Beers most days out of the week.  12/2021   Social Determinants of Health   Financial Resource Strain: Not on file  Food Insecurity: Not on file  Transportation Needs: Not on file  Physical Activity: Not on file  Stress: Not on file  Social Connections: Not on file  Intimate Partner Violence: Not on file    Family History  Problem Relation Age of Onset   Heart disease Mother        CHF   Hypertension Mother    Heart disease Father 23       MI   Heart disease Brother    Heart disease Maternal Aunt    Diabetes Maternal Aunt    Cancer Maternal Aunt        cervical   Heart disease Maternal Uncle    Diabetes Maternal Uncle    Heart disease Maternal Grandmother        CHF   Diabetes Maternal Grandmother    Heart disease Paternal Uncle    Heart disease Brother    Heart disease Brother      Current Outpatient Medications:    amLODipine (NORVASC) 5 MG tablet, TAKE 1 TABLET EVERY DAY, Disp: 30 tablet, Rfl: 0   aspirin EC 81 MG tablet, Take 1 tablet (81 mg total) by mouth daily., Disp: 90 tablet, Rfl: 3   diclofenac (VOLTAREN) 75 MG EC tablet, TAKE 1 TABLET(75 MG) BY MOUTH  TWICE DAILY, Disp: 30 tablet, Rfl: 2   hydrochlorothiazide (HYDRODIURIL) 25 MG tablet, Take 1 tablet (25 mg total) by mouth daily., Disp: 90 tablet, Rfl: 3   valsartan (DIOVAN) 80 MG tablet, TAKE 1 TABLET EVERY DAY, Disp: 90 tablet, Rfl: 3   potassium chloride SA (KLOR-CON) 20 MEQ tablet, TAKE 1 TABLET TWICE DAILY (Patient not taking: Reported  on 01/15/2022), Disp: 120 tablet, Rfl: 0   rosuvastatin (CRESTOR) 10 MG tablet, Take 1 tablet (10 mg total) by mouth daily. (Patient not taking: Reported on 01/15/2022), Disp: 90 tablet, Rfl: 3  Allergies  Allergen Reactions   Morphine And Related Other (See Comments)    Palpitations and shaking.    History reviewed: allergies, current medications, past family history, past medical history, past social history, past surgical history and problem list  Chronic issues discussed: History of alcohol abuse-she notes that she drinks about 2-3 Miller light beers daily  Hyperlipidemia- Not currently taking statin.  She decided to stop this on her own.  Hypertension - not often checking BPs.  Compliant with medications  Joint pains, leg pain-seeing orthopedics, limited in her ambulation due to pain.  Uses a cane   Acute issues discussed: No acute issues  Objective:     BP 138/82    Pulse 73    Ht _0  (1.575 m)    Wt 173 lb 3.2 oz (78.6 kg)    LMP  (LMP Unknown)    BMI 31.68 kg/m   Wt Readings from Last 3 Encounters:  01/15/22 173 lb 3.2 oz (78.6 kg)  09/05/21 160 lb (72.6 kg)  02/21/21 160 lb (72.6 kg)   BP Readings from Last 3 Encounters:  01/15/22 138/82  10/09/20 124/80  09/19/20 (!) 158/82     Biometrics BP 138/82    Pulse 73    Ht _1  (1.575 m)    Wt 173 lb 3.2 oz (78.6 kg)    LMP  (LMP Unknown)    BMI 31.68 kg/m   General: Well-developed well-nourished no acute distress, white female alert and oriented x3 Answers questions appropriately, recall normal, can perform simple calculations HEENT: normocephalic, sclerae anicteric, TMs  pearly, nares patent, no discharge or erythema, pharynx normal Neck: supple, no lymphadenopathy, no thyromegaly, no masses, no bruits Heart: 2 out of 6 brief holosystolic murmur heard in upper sternal borders, RRR, normal S1, S2 Lungs: CTA bilaterally, no wheezes, rhonchi, or rales Abdomen: +bs, soft, non tender, non distended, no masses, no hepatomegaly, no splenomegaly Musculoskeletal: limited exam, slow to get on and off exam table due to leg pain, limited mobility, nontender, no swelling, no obvious deformity Extremities: no edema, no cyanosis, no clubbing Pulses: 2+ symmetric, upper and lower extremities, normal cap refill Neurological: alert, oriented x 3, CN2-12 intact, strength normal upper extremities but decreased lower extremities, sensation normal throughout, DTRs 2+ throughout, no cerebellar signs, gait slow due to leg pain, amublates with cane Psychiatric: normal affect, behavior normal, pleasant  Breast/pelvic - declined    Assessment:   Encounter Diagnoses  Name Primary?   Encounter for screening mammogram for malignant neoplasm of breast Yes   Needs flu shot    Encounter for health maintenance examination in adult    Estrogen deficiency    Essential hypertension, benign    Gastroesophageal reflux disease without esophagitis    Advanced directives, counseling/discussion    Chewing tobacco use    Chronic pain of both lower extremities    Coronary artery disease involving native coronary artery of native heart without angina pectoris    Does mobilize using cane    Dyslipidemia, goal LDL below 70    Family history of premature CAD    Fatty liver    History of alcohol abuse    Impaired fasting blood sugar    Vaccine counseling    Unsteadiness on feet    Other fatigue  Postmenopausal    Hx of estrogen deficiency    Medicare annual wellness visit, subsequent      Plan:    This visit was a preventative care visit, also known as wellness visit or routine  physical.   Topics typically include healthy lifestyle, diet, exercise, preventative care, vaccinations, sick and well care, proper use of emergency dept and after hours care, as well as other concerns.     Recommendations: Continue to return yearly for your annual wellness and preventative care visits.  This gives [place] a chance to discuss healthy lifestyle, exercise, vaccinations, review your chart record, and perform screenings where appropriate.  I recommend you see your eye doctor yearly for routine vision care.  I recommend you see your dentist yearly for routine dental care including hygiene visits twice yearly.   Vaccination recommendations were reviewed Immunization History  Administered Date(s) Administered   Fluad Quad(high Dose 65+) 10/09/2020   Influenza, High Dose Seasonal PF 01/19/2017, 11/22/2019   Influenza-Unspecified 11/22/2019   PFIZER(Purple Top)SARS-COV-2 Vaccination 07/25/2020, 08/16/2020   Pneumococcal Conjugate-13 01/19/2017   Pneumococcal Polysaccharide-23 11/04/2005   Td 11/04/2005   Tdap 09/21/2011, 08/01/2016    Counseled on the influenza virus vaccine.  Vaccine information sheet given.   High dose Influenza vaccine given after consent obtained.  Shingles vaccine:  I recommend you have a shingles vaccine to help prevent shingles or herpes zoster outbreak.   Please call your insurer to inquire about coverage for the Shingrix vaccine given in 2 doses.   Some insurers cover this vaccine after age 64, some cover this after age 66.  If your insurer covers this, then call to schedule appointment to have this vaccine here.  At your convenience I recommend you come back in for Prevnar 20 pneumococcal vaccine.   Screening for cancer: Colon cancer screening: We will refer you for Cologard kit  Breast cancer screening: You should perform a self breast exam monthly.   We reviewed recommendations for regular mammograms and breast cancer screening.  Please call to  schedule your mammogram and bone density test.  [facility]  [phone] [address] [place],  [address]   Cervical cancer screening: You had a hysteretomy so you no longer need pap smear, but you should have a pelvic exam periodically   Skin cancer screening: Check your skin regularly for new changes, growing lesions, or other lesions of concern Come in for evaluation if you have skin lesions of concern.  Lung cancer screening: If you have a greater than 20 pack year history of tobacco use, then you may qualify for lung cancer screening with a chest CT scan.   Please call your insurance company to inquire about coverage for this test.  We currently don't have screenings for other cancers besides breast, cervical, colon, and lung cancers.  If you have a strong family history of cancer or have other cancer screening concerns, please let me know.    Bone health: Get at least 150 minutes of aerobic exercise weekly Get weight bearing exercise at least once weekly Bone density test:  A bone density test is an imaging test that uses a type of X-ray to measure the amount of calcium and other minerals in your bones. The test may be used to diagnose or screen you for a condition that causes weak or thin bones (osteoporosis), predict your risk for a broken bone (fracture), or determine how well your osteoporosis treatment is working. The bone density  test is recommended for females 28 and older, or females or males <99 if certain risk factors such as thyroid disease, long term use of steroids such as for asthma or rheumatological issues, vitamin D deficiency, estrogen deficiency, family history of osteoporosis, self or family history of fragility fracture in first degree relative.  Please call to schedule your mammogram and bone density test.  [facility]  [phone] [address], [place],  [place] [address]    Heart health: Get at least 150 minutes of aerobic exercise weekly Limit alcohol It is important to maintain a healthy blood pressure and healthy cholesterol numbers  Heart disease screening: Screening for heart disease includes screening for blood pressure, fasting lipids, glucose/diabetes screening, BMI height to weight ratio, reviewed of smoking status, physical activity, and diet.    Goals include blood pressure 120/80 or less, maintaining a healthy lipid/cholesterol profile, preventing diabetes or keeping diabetes numbers under good control, not smoking or using tobacco products, exercising most days per week or at least 150 minutes per week of exercise, and eating healthy variety of fruits and vegetables, healthy oils, and avoiding unhealthy food choices like fried food, fast food, high sugar and high cholesterol foods.      Medical care options: I recommend you continue to seek care here first for routine care.  We try really hard to have available appointments Monday through Friday daytime hours for sick visits, acute visits, and physicals.  Urgent care should be used for after hours and weekends for significant issues that cannot wait till the next day.  The emergency department should be used for significant potentially life-threatening emergencies.  The emergency department is expensive, can often have long wait times for less significant concerns, so try to utilize primary care, urgent care, or telemedicine when possible to avoid unnecessary trips to the emergency department.  Virtual visits and telemedicine have been introduced since the pandemic started in 2020, and can be convenient ways to receive medical care.  We offer virtual appointments as well to assist you in a variety of options to seek medical care.   Advanced Directives: I recommend you consider completing a [place] and Living Will.   These documents respect your wishes and help  alleviate burdens on your loved ones if you were to become terminally ill or be in a position to need those documents enforced.    You can complete Advanced Directives yourself, have them notarized, then have copies made for our office, for you and for anybody you feel should have them in safe keeping.  Or, you can have an attorney prepare these documents.   If you haven't updated your Last Will and Testament in a while, it may be worthwhile having an attorney prepare these documents together and save on some costs.      Significant other issues discussed: Estrogen deficiency and postmenopausal-go for bone density test  Chronic leg pain-follow-up orthopedics  Ambulates with cane, limited mobility  Hypertension-continue current medications, no current cardiac symptoms  GERD-no recent complaint  Chewing tobacco use-you are high risk for oral cancer.  I recommend you stop chewing tobacco.  I recommend you follow-up with dentist yearly for surveillance  Coronary artery disease, hyperlipidemia-given your known cholesterol atherosclerosis plaques in your arteries you should be on a statin as  preventative measure.  Since you are not very agreeable to statin we might want to consider a medicine like Praluent or some other class of  medication for combination with a lower dose statin if you would be agreeable.  Consider these options.  Fatty liver disease-I recommend weight loss to healthy low-fat low sugar diet and regular exercise.  Alcohol drinker-I recommend you quit or cut way back on alcohol.  There is no safe amount.     Impaired glucose-updated labs today    Betzaida was seen today for fasting cpe.  Diagnoses and all orders for this visit:  Encounter for screening mammogram for malignant neoplasm of breast -     MM DIGITAL SCREENING BILATERAL; Future  Needs flu shot -     Flu Vaccine QUAD High Dose(Fluad)  Encounter for health maintenance examination in adult -     Cologuard -      MM DIGITAL SCREENING BILATERAL; Future -     DG Bone Density; Future -     Comprehensive metabolic panel -     CBC with Differential/Platelet -     Lipid panel -     Hemoglobin A1c -     TSH + free T4 -     Urinalysis, Routine w reflex microscopic -     Vitamin B12 -     Sedimentation rate  Estrogen deficiency  Essential hypertension, benign -     Comprehensive metabolic panel  Gastroesophageal reflux disease without esophagitis  Advanced directives, counseling/discussion  Chewing tobacco use  Chronic pain of both lower extremities  Coronary artery disease involving native coronary artery of native heart without angina pectoris  Does mobilize using cane  Dyslipidemia, goal LDL below 70 -     Lipid panel  Family history of premature CAD  Fatty liver -     Comprehensive metabolic panel  History of alcohol abuse  Impaired fasting blood sugar -     Hemoglobin A1c  Vaccine counseling  Unsteadiness on feet  Other fatigue -     Comprehensive metabolic panel -     CBC with Differential/Platelet -     TSH + free T4 -     Urinalysis, Routine w reflex microscopic -     Vitamin B12 -     Sedimentation rate  Postmenopausal -     DG Bone Density; Future  Hx of estrogen deficiency -     DG Bone Density; Future  Medicare annual wellness visit, subsequent    Medicare Attestation A preventative services visit was completed today.  During the course of the visit the patient was educated and counseled about appropriate screening and preventive services.  A health risk assessment was established with the patient that included a review of current medications, allergies, social history, family history, medical and preventative health history, biometrics, and preventative screenings to identify potential safety concerns or impairments.  A personalized plan was printed today for the patient's records and use.   Personalized health advice and education was given today to  reduce health risks and promote self management and wellness.  Information regarding end of life planning was discussed today.  Dorothea Ogle, PA-C   01/15/2022

## 2022-01-15 NOTE — Telephone Encounter (Signed)
error 

## 2022-01-16 LAB — COMPREHENSIVE METABOLIC PANEL
ALT: 16 IU/L (ref 0–32)
AST: 22 IU/L (ref 0–40)
Albumin/Globulin Ratio: 1.3 (ref 1.2–2.2)
Albumin: 4.3 g/dL (ref 3.7–4.7)
Alkaline Phosphatase: 76 IU/L (ref 44–121)
BUN/Creatinine Ratio: 10 — ABNORMAL LOW (ref 12–28)
BUN: 6 mg/dL — ABNORMAL LOW (ref 8–27)
Bilirubin Total: 0.6 mg/dL (ref 0.0–1.2)
CO2: 24 mmol/L (ref 20–29)
Calcium: 8.9 mg/dL (ref 8.7–10.3)
Chloride: 101 mmol/L (ref 96–106)
Creatinine, Ser: 0.58 mg/dL (ref 0.57–1.00)
Globulin, Total: 3.3 g/dL (ref 1.5–4.5)
Glucose: 96 mg/dL (ref 70–99)
Potassium: 4.3 mmol/L (ref 3.5–5.2)
Sodium: 139 mmol/L (ref 134–144)
Total Protein: 7.6 g/dL (ref 6.0–8.5)
eGFR: 97 mL/min/{1.73_m2} (ref >59)

## 2022-01-16 LAB — CBC WITH DIFFERENTIAL/PLATELET
Basophils Absolute: 0 10*3/uL (ref 0.0–0.2)
Basos: 0 %
EOS (ABSOLUTE): 0.1 10*3/uL (ref 0.0–0.4)
Eos: 2 %
Hematocrit: 40 % (ref 34.0–46.6)
Hemoglobin: 13.7 g/dL (ref 11.1–15.9)
Immature Grans (Abs): 0 10*3/uL (ref 0.0–0.1)
Immature Granulocytes: 0 %
Lymphocytes Absolute: 1.5 10*3/uL (ref 0.7–3.1)
Lymphs: 21 %
MCH: 32.2 pg (ref 26.6–33.0)
MCHC: 34.3 g/dL (ref 31.5–35.7)
MCV: 94 fL (ref 79–97)
Monocytes Absolute: 0.7 10*3/uL (ref 0.1–0.9)
Monocytes: 10 %
Neutrophils Absolute: 4.7 10*3/uL (ref 1.4–7.0)
Neutrophils: 67 %
Platelets: 192 10*3/uL (ref 150–450)
RBC: 4.25 x10E6/uL (ref 3.77–5.28)
RDW: 11.6 % — ABNORMAL LOW (ref 11.7–15.4)
WBC: 7.1 10*3/uL (ref 3.4–10.8)

## 2022-01-16 LAB — URINALYSIS, ROUTINE W REFLEX MICROSCOPIC
Bilirubin, UA: NEGATIVE
Glucose, UA: NEGATIVE
Ketones, UA: NEGATIVE
Leukocytes,UA: NEGATIVE
Nitrite, UA: NEGATIVE
Protein,UA: NEGATIVE
RBC, UA: NEGATIVE
Specific Gravity, UA: 1.009 (ref 1.005–1.030)
Urobilinogen, Ur: 0.2 mg/dL (ref 0.2–1.0)
pH, UA: 6 (ref 5.0–7.5)

## 2022-01-16 LAB — LIPID PANEL
Chol/HDL Ratio: 3.7 ratio (ref 0.0–4.4)
Cholesterol, Total: 221 mg/dL — ABNORMAL HIGH (ref 100–199)
HDL: 59 mg/dL (ref >39)
LDL Chol Calc (NIH): 133 mg/dL — ABNORMAL HIGH (ref 0–99)
Triglycerides: 163 mg/dL — ABNORMAL HIGH (ref 0–149)
VLDL Cholesterol Cal: 29 mg/dL (ref 5–40)

## 2022-01-16 LAB — SEDIMENTATION RATE: Sed Rate: 41 mm/hr — ABNORMAL HIGH (ref 0–40)

## 2022-01-16 LAB — TSH+FREE T4
Free T4: 1.27 ng/dL (ref 0.82–1.77)
TSH: 0.827 u[IU]/mL (ref 0.450–4.500)

## 2022-01-16 LAB — HEMOGLOBIN A1C
Est. average glucose Bld gHb Est-mCnc: 111 mg/dL
Hgb A1c MFr Bld: 5.5 % (ref 4.8–5.6)

## 2022-01-16 LAB — VITAMIN B12: Vitamin B-12: 525 pg/mL (ref 232–1245)

## 2022-01-19 ENCOUNTER — Other Ambulatory Visit: Payer: Self-pay | Admitting: Medical

## 2022-01-19 MED ORDER — HYDROCHLOROTHIAZIDE 25 MG PO TABS: 25.0000 mg | ORAL_TABLET | Freq: Every day | ORAL | 3 refills | Status: DC

## 2022-01-19 MED ORDER — ASPIRIN EC 81 MG PO TBEC: 81.0000 mg | DELAYED_RELEASE_TABLET | Freq: Every day | ORAL | 3 refills | Status: DC

## 2022-01-19 MED ORDER — AMLODIPINE BESYLATE 5 MG PO TABS: 5.0000 mg | ORAL_TABLET | Freq: Every day | ORAL | 3 refills | Status: DC

## 2022-01-21 ENCOUNTER — Other Ambulatory Visit: Payer: Self-pay | Admitting: Medical

## 2022-01-21 MED ORDER — REPATHA 140 MG/ML ~~LOC~~ SOSY: 140.0000 mg | PREFILLED_SYRINGE | SUBCUTANEOUS | 5 refills | Status: DC

## 2022-01-22 ENCOUNTER — Other Ambulatory Visit: Payer: Self-pay | Admitting: Medical

## 2022-01-22 DIAGNOSIS — Z0000 Encounter for general adult medical examination without abnormal findings: Secondary | ICD-10-CM

## 2022-01-22 DIAGNOSIS — Z780 Asymptomatic menopausal state: Secondary | ICD-10-CM

## 2022-01-22 DIAGNOSIS — Z8639 Personal history of other endocrine, nutritional and metabolic disease: Secondary | ICD-10-CM

## 2022-01-22 DIAGNOSIS — Z1231 Encounter for screening mammogram for malignant neoplasm of breast: Secondary | ICD-10-CM

## 2022-01-24 ENCOUNTER — Telehealth: Payer: Self-pay

## 2022-01-24 NOTE — Telephone Encounter (Signed)
P.A. REPATHA 

## 2022-01-29 NOTE — Telephone Encounter (Signed)
P.A. approved til 07/23/22, called pharmacy went thru for $45.  Called patient and she states will be hard for her to afford every month.  Told her about Pt Assistance,  printed application, will mail to her.

## 2022-02-25 ENCOUNTER — Telehealth: Payer: Self-pay

## 2022-02-25 NOTE — Telephone Encounter (Signed)
Recv'd message from [facility] mail order requesting Rx for Diclofenac sodium DR 75 mg tablet t# [phone]

## 2022-02-26 ENCOUNTER — Other Ambulatory Visit: Payer: Self-pay | Admitting: Medical

## 2022-02-26 MED ORDER — DICLOFENAC SODIUM 75 MG PO TBEC: DELAYED_RELEASE_TABLET | ORAL | 0 refills | Status: DC

## 2022-02-27 NOTE — Telephone Encounter (Signed)
DONE

## 2022-03-10 DIAGNOSIS — Z1211 Encounter for screening for malignant neoplasm of colon: Secondary | ICD-10-CM | POA: Diagnosis not present

## 2022-03-18 LAB — COLOGUARD: COLOGUARD: NEGATIVE

## 2022-03-23 IMAGING — CR DG LUMBAR SPINE COMPLETE 4+V
5 series · 5 of 5 positions shown · non-contrast
Comparison: 07/04/2004

CLINICAL DATA: Left leg weakness for 1 month.  Fall January 2020

EXAM:
LUMBAR SPINE - COMPLETE 4+ VIEW

[t lumbar spine ap]
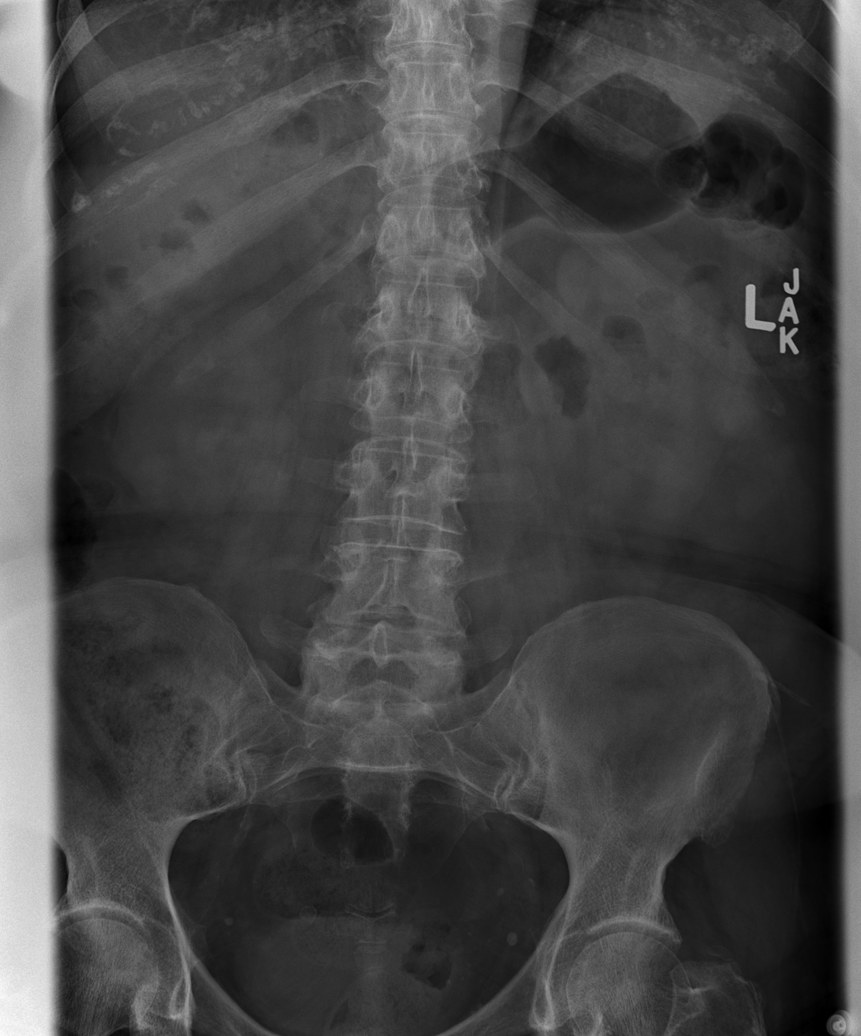

[t lumbar spine obl (1 of 2)]
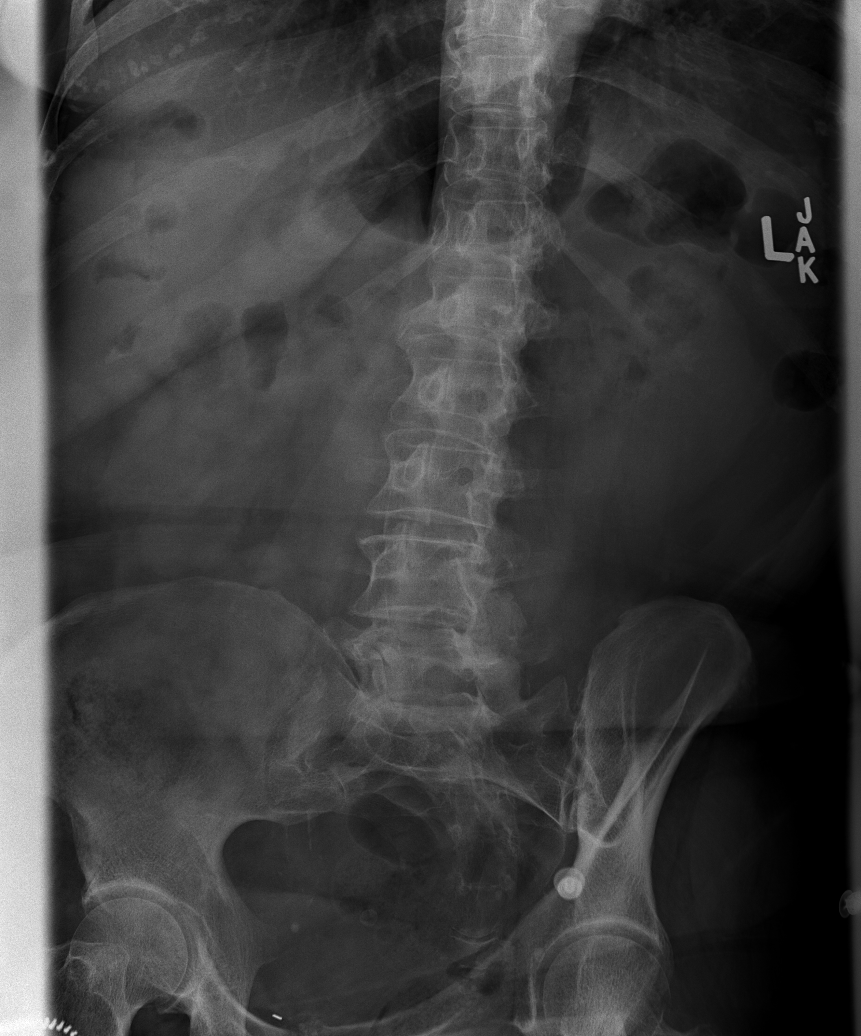

[t lumbar spine obl (2 of 2)]
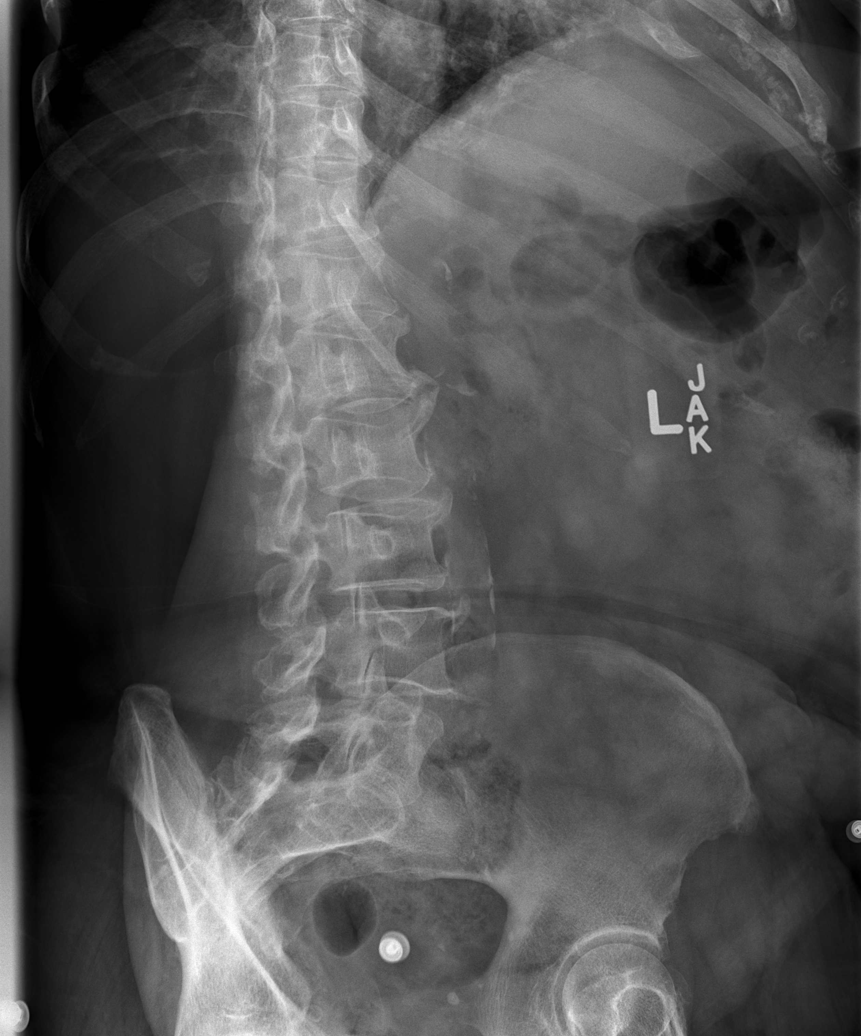

[t lumbar spine lat]
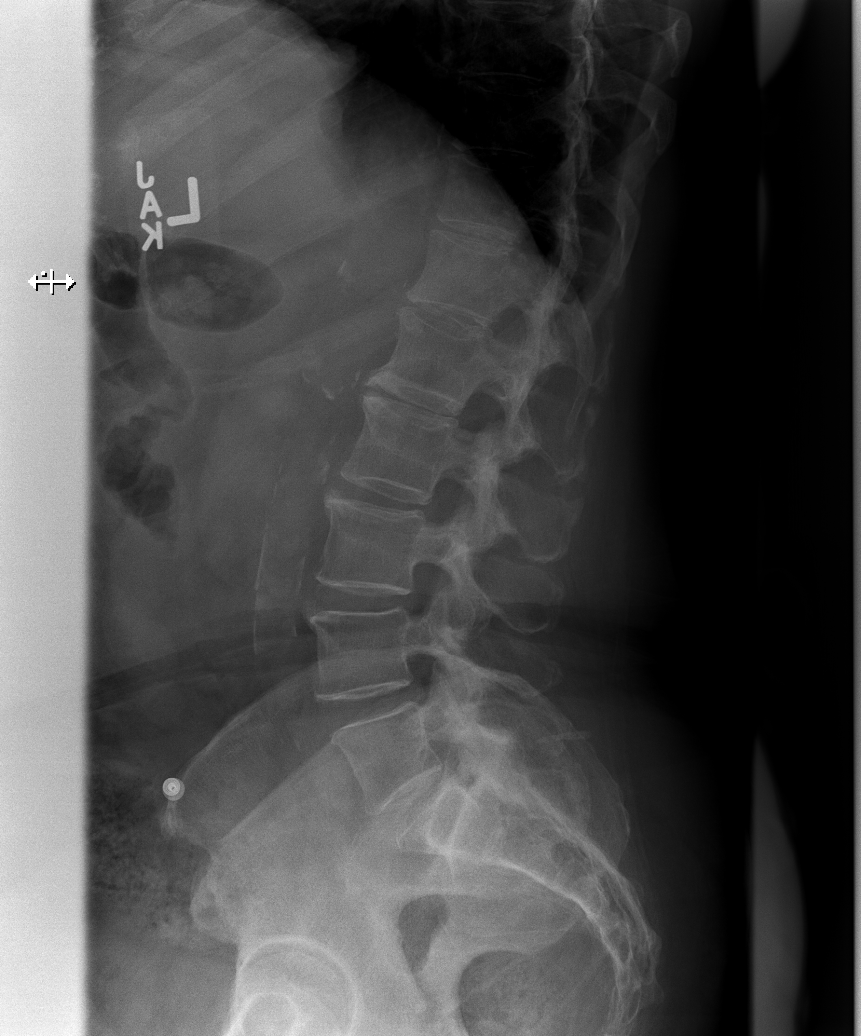

[t lumbar l-5 s-1 spot]
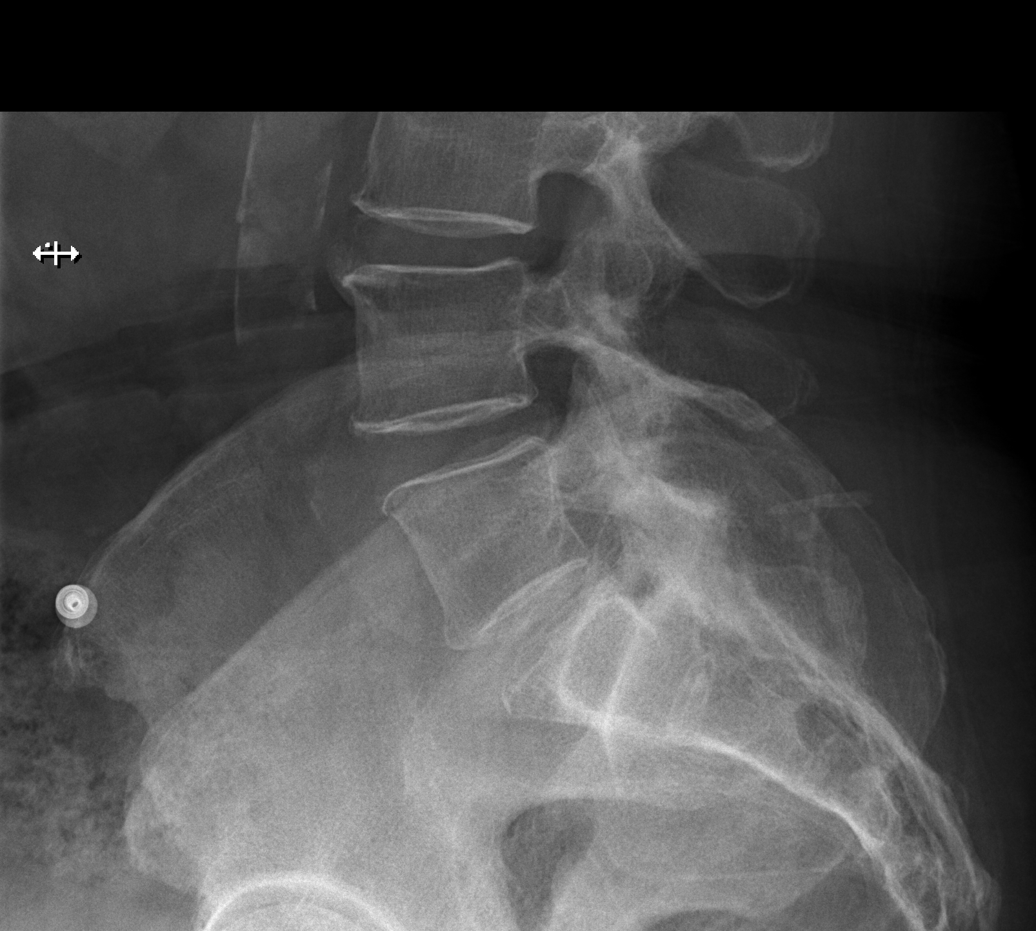

[5 of 5 positions shown; findings below may reference images not displayed]

FINDINGS: L1-2 to L3-4 disc narrowing and endplate spurring. Degenerative
facet spurring at L4-5 and L5-S1. No listhesis. No evidence of
fracture or bone lesion.
IMPRESSION: 1. No acute/posttraumatic finding.
2. Degenerative endplate and facet spurring as noted above.

## 2022-04-08 ENCOUNTER — Other Ambulatory Visit: Payer: Self-pay | Admitting: Medical

## 2022-06-11 ENCOUNTER — Ambulatory Visit
Admission: RE | Admit: 2022-06-11 | Discharge: 2022-06-11 | Disposition: A | Discharge status: Home / Self Care | Payer: Medicare HMO | Source: Ambulatory Visit | Attending: Medical | Admitting: Medical

## 2022-06-11 ENCOUNTER — Ambulatory Visit
Admission: RE | Admit: 2022-06-11 | Discharge: 2022-06-11 | Disposition: A | Discharge status: Home / Self Care | Payer: Medicare PPO | Source: Ambulatory Visit | Attending: Medical | Admitting: Medical

## 2022-06-11 DIAGNOSIS — Z1231 Encounter for screening mammogram for malignant neoplasm of breast: Secondary | ICD-10-CM | POA: Diagnosis not present

## 2022-06-11 DIAGNOSIS — Z8639 Personal history of other endocrine, nutritional and metabolic disease: Secondary | ICD-10-CM

## 2022-06-11 DIAGNOSIS — Z780 Asymptomatic menopausal state: Secondary | ICD-10-CM

## 2022-06-11 DIAGNOSIS — Z0000 Encounter for general adult medical examination without abnormal findings: Secondary | ICD-10-CM

## 2022-06-27 ENCOUNTER — Telehealth: Payer: Self-pay | Admitting: Medical

## 2022-06-27 NOTE — Telephone Encounter (Signed)
P.A. REPATHA RENEWAL  

## 2022-07-03 NOTE — Telephone Encounter (Signed)
P.A. approved til 12/27/21, sent mychart message

## 2022-07-28 NOTE — Telephone Encounter (Signed)
Called pt and asked her about the application and she said she sent it in and got letter she was approved but had to pay $600, she was confused, I think she got the prior authorization letter.  So I explained the difference in the that she was approved for her P.A. for her insurance but she couldn't afford the co pay and we were trying to get Patient assistance for free medications.  So I'm not sure if she sent in application or not.  So I am mailing her another one.  Called [employer]  T# [phone] and they do not have an application for pt.  Mailed her another one

## 2022-08-04 ENCOUNTER — Telehealth: Payer: Self-pay | Admitting: Medical

## 2022-08-04 NOTE — Telephone Encounter (Signed)
Called pt and she did receive the PT ASSISTANCE application for REPATHA and went over it over the phone and she will bring it back to the office  tomorrow

## 2022-09-02 ENCOUNTER — Encounter: Payer: Self-pay | Admitting: Internal Medicine

## 2022-10-06 ENCOUNTER — Encounter: Payer: Self-pay | Admitting: Internal Medicine

## 2022-12-07 ENCOUNTER — Other Ambulatory Visit: Payer: Self-pay | Admitting: Medical

## 2022-12-28 ENCOUNTER — Other Ambulatory Visit: Payer: Self-pay | Admitting: Medical

## 2023-01-07 ENCOUNTER — Encounter: Payer: Self-pay | Admitting: Internal Medicine

## 2023-01-19 ENCOUNTER — Ambulatory Visit (INDEPENDENT_AMBULATORY_CARE_PROVIDER_SITE_OTHER): Payer: Medicare PPO

## 2023-01-19 VITALS — Ht 62.0 in | Wt 173.0 lb

## 2023-01-19 DIAGNOSIS — Z0000 Encounter for general adult medical examination without abnormal findings: Secondary | ICD-10-CM

## 2023-01-19 DIAGNOSIS — R269 Unspecified abnormalities of gait and mobility: Secondary | ICD-10-CM

## 2023-01-19 NOTE — Patient Instructions (Signed)
[name] , Thank you for taking time to come for your Medicare Wellness Visit. I appreciate your ongoing commitment to your health goals. Please review the following plan we discussed and let me know if I can assist you in the future.   These are the goals we discussed:  Goals      Prevent falls     Keep home pathways clear        This is a list of the screening recommended for you and due dates:  Health Maintenance  Topic Date Due   Hepatitis C Screening: USPSTF Recommendation to screen - Ages 72-79 yo.  Never done   Zoster (Shingles) Vaccine (1 of 2) Never done   Pneumonia Vaccine (3 - PPSV23 or PCV20) 01/19/2022   COVID-19 Vaccine (3 - 2023-24 season) 08/28/2022   Flu Shot  03/28/2023*   Medicare Annual Wellness Visit  01/20/2024   Mammogram  06/11/2024   Cologuard (Stool DNA test)  03/10/2025   DTaP/Tdap/Td vaccine (4 - Td or Tdap) 08/01/2026   DEXA scan (bone density measurement)  Completed   HPV Vaccine  Aged Out   Colon Cancer Screening  Discontinued  *Topic was postponed. The date shown is not the original due date.    Advanced directives: none  Conditions/risks identified: falls risk;mobility issues  Next appointment: Follow up in one year for your annual wellness visit 02/10/24 @11 :30 telephone   Preventive Care 65 Years and Older, Female Preventive care refers to lifestyle choices and visits with your health care provider that can promote health and wellness. What does preventive care include? A yearly physical exam. This is also called an annual well check. Dental exams once or twice a year. Routine eye exams. Ask your health care provider how often you should have your eyes checked. Personal lifestyle choices, including: Daily care of your teeth and gums. Regular physical activity. Eating a healthy diet. Avoiding tobacco and drug use. Limiting alcohol use. Practicing safe sex. Taking low-dose aspirin every day. Taking vitamin and mineral supplements  as recommended by your health care provider. What happens during an annual well check? The services and screenings done by your health care provider during your annual well check will depend on your age, overall health, lifestyle risk factors, and family history of disease. Counseling  Your health care provider may ask you questions about your: Alcohol use. Tobacco use. Drug use. Emotional well-being. Home and relationship well-being. Sexual activity. Eating habits. History of falls. Memory and ability to understand (cognition). Work and work Statistician. Reproductive health. Screening  You may have the following tests or measurements: Height, weight, and BMI. Blood pressure. Lipid and cholesterol levels. These may be checked every 5 years, or more frequently if you are over 74 years old. Skin check. Lung cancer screening. You may have this screening every year starting at age 38 if you have a 30-pack-year history of smoking and currently smoke or have quit within the past 15 years. Fecal occult blood test (FOBT) of the stool. You may have this test every year starting at age 9. Flexible sigmoidoscopy or colonoscopy. You may have a sigmoidoscopy every 5 years or a colonoscopy every 10 years starting at age 24. Hepatitis C blood test. Hepatitis B blood test. Sexually transmitted disease (STD) testing. Diabetes screening. This is done by checking your blood sugar (glucose) after you have not eaten for a while (fasting). You may have this done every 1-3 years. Bone density scan. This is done to screen for osteoporosis.  You may have this done starting at age 61. Mammogram. This may be done every 1-2 years. Talk to your health care provider about how often you should have regular mammograms. Talk with your health care provider about your test results, treatment options, and if necessary, the need for more tests. Vaccines  Your health care provider may recommend certain vaccines, such  as: Influenza vaccine. This is recommended every year. Tetanus, diphtheria, and acellular pertussis (Tdap, Td) vaccine. You may need a Td booster every 10 years. Zoster vaccine. You may need this after age 32. Pneumococcal 13-valent conjugate (PCV13) vaccine. One dose is recommended after age 66. Pneumococcal polysaccharide (PPSV23) vaccine. One dose is recommended after age 61. Talk to your health care provider about which screenings and vaccines you need and how often you need them. This information is not intended to replace advice given to you by your health care provider. Make sure you discuss any questions you have with your health care provider. Document Released: 01/10/2016 Document Revised: 09/02/2016 Document Reviewed: 10/15/2015 Elsevier Interactive Patient Education  2017 [place] Prevention in the Home Falls can cause injuries. They can happen to people of all ages. There are many things you can do to make your home safe and to help prevent falls. What can I do on the outside of my home? Regularly fix the edges of walkways and driveways and fix any cracks. Remove anything that might make you trip as you walk through a door, such as a raised step or threshold. Trim any bushes or trees on the path to your home. Use bright outdoor lighting. Clear any walking paths of anything that might make someone trip, such as rocks or tools. Regularly check to see if handrails are loose or broken. Make sure that both sides of any steps have handrails. Any raised decks and porches should have guardrails on the edges. Have any leaves, snow, or ice cleared regularly. Use sand or salt on walking paths during winter. Clean up any spills in your garage right away. This includes oil or grease spills. What can I do in the bathroom? Use night lights. Install grab bars by the toilet and in the tub and shower. Do not use towel bars as grab bars. Use non-skid mats or decals in the tub or  shower. If you need to sit down in the shower, use a plastic, non-slip stool. Keep the floor dry. Clean up any water that spills on the floor as soon as it happens. Remove soap buildup in the tub or shower regularly. Attach bath mats securely with double-sided non-slip rug tape. Do not have throw rugs and other things on the floor that can make you trip. What can I do in the bedroom? Use night lights. Make sure that you have a light by your bed that is easy to reach. Do not use any sheets or blankets that are too big for your bed. They should not hang down onto the floor. Have a firm chair that has side arms. You can use this for support while you get dressed. Do not have throw rugs and other things on the floor that can make you trip. What can I do in the kitchen? Clean up any spills right away. Avoid walking on wet floors. Keep items that you use a lot in easy-to-reach places. If you need to reach something above you, use a strong step stool that has a grab bar. Keep electrical cords out of the way. Do not use  floor polish or wax that makes floors slippery. If you must use wax, use non-skid floor wax. Do not have throw rugs and other things on the floor that can make you trip. What can I do with my stairs? Do not leave any items on the stairs. Make sure that there are handrails on both sides of the stairs and use them. Fix handrails that are broken or loose. Make sure that handrails are as long as the stairways. Check any carpeting to make sure that it is firmly attached to the stairs. Fix any carpet that is loose or worn. Avoid having throw rugs at the top or bottom of the stairs. If you do have throw rugs, attach them to the floor with carpet tape. Make sure that you have a light switch at the top of the stairs and the bottom of the stairs. If you do not have them, ask someone to add them for you. What else can I do to help prevent falls? Wear shoes that: Do not have high heels. Have  rubber bottoms. Are comfortable and fit you well. Are closed at the toe. Do not wear sandals. If you use a stepladder: Make sure that it is fully opened. Do not climb a closed stepladder. Make sure that both sides of the stepladder are locked into place. Ask someone to hold it for you, if possible. Clearly mark and make sure that you can see: Any grab bars or handrails. First and last steps. Where the edge of each step is. Use tools that help you move around (mobility aids) if they are needed. These include: Canes. Walkers. Scooters. Crutches. Turn on the lights when you go into a dark area. Replace any light bulbs as soon as they burn out. Set up your furniture so you have a clear path. Avoid moving your furniture around. If any of your floors are uneven, fix them. If there are any pets around you, be aware of where they are. Review your medicines with your doctor. Some medicines can make you feel dizzy. This can increase your chance of falling. Ask your doctor what other things that you can do to help prevent falls. This information is not intended to replace advice given to you by your health care provider. Make sure you discuss any questions you have with your health care provider. Document Released: 10/10/2009 Document Revised: 05/21/2016 Document Reviewed: 01/18/2015 Elsevier Interactive Patient Education  2017 [employer].

## 2023-01-19 NOTE — Progress Notes (Addendum)
Virtual Visit via Telephone Note  I connected with  [name] on 01/19/23 at  2:30 PM EST by telephone and verified that I am speaking with the correct person using two identifiers.  Location: Patient: home Provider: PFM/NHA Persons participating in the virtual visit: patient/Nurse Health Advisor   I discussed the limitations, risks, security and privacy concerns of performing an evaluation and management service by telephone and the availability of in person appointments. The patient expressed understanding and agreed to proceed.  Interactive audio and video telecommunications were attempted between this nurse and patient, however failed, due to patient having technical difficulties OR patient did not have access to video capability.  We continued and completed visit with audio only.  Some vital signs may be absent or patient reported.   Roger Shelter, LPN  Subjective:   [name] is a 72 y.o. female who presents for Medicare Annual (Subsequent) preventive examination.  Review of Systems     Cardiac Risk Factors include: advanced age (>69mn, >>72women);dyslipidemia;sedentary lifestyle;obesity (BMI >30kg/m2)    Objective:    Today's Vitals   01/19/23 1423 01/19/23 1426  Weight: 173 lb (78.5 kg)   Height: 5' 2"$  (1.575 m)   PainSc:  3    Body mass index is 31.64 kg/m.     01/19/2023    2:47 PM 10/29/2020   11:11 AM 08/01/2016    2:10 AM 03/18/2016    1:42 PM  Advanced Directives  Does Patient Have a Medical Advance Directive? No No No No  Would patient like information on creating a medical advance directive? Yes (MAU/Ambulatory/Procedural Areas - Information given) No - Patient declined No - patient declined information No - patient declined information    Current Medications (verified) Outpatient Encounter Medications as of 01/19/2023  Medication Sig   amLODipine (NORVASC) 5 MG tablet TAKE 1 TABLET EVERY DAY   aspirin EC 81 MG tablet Take 1 tablet (81 mg  total) by mouth daily.   hydrochlorothiazide (HYDRODIURIL) 25 MG tablet Take 1 tablet (25 mg total) by mouth daily.   valsartan (DIOVAN) 80 MG tablet TAKE 1 TABLET EVERY DAY   diclofenac (VOLTAREN) 75 MG EC tablet TAKE 1 TABLET(75 MG) BY MOUTH once or twice daily prn (Patient not taking: Reported on 01/19/2023)   Evolocumab (REPATHA) 140 MG/ML SOSY Inject 140 mg into the skin every 14 (fourteen) days. (Patient not taking: Reported on 01/19/2023)   rosuvastatin (CRESTOR) 10 MG tablet Take 1 tablet (10 mg total) by mouth daily. (Patient not taking: Reported on 01/19/2023)   No facility-administered encounter medications on file as of 01/19/2023.    Allergies (verified) Morphine and related   History: Past Medical History:  Diagnosis Date   Allergic rhinitis, cause unspecified    Arthritis    Chewing tobacco use    Coronary artery disease, non-occlusive December 2006   Dyslipidemia    Essential hypertension    previously uncontrolled, now well-controlled; no evidence of renal artery stenosis   GERD (gastroesophageal reflux disease)    H/O echocardiogram 12/2014   60-65 % EF, mild basal hypertrophy focally at septum, otherwise normal   Heart murmur 12/2014   mild focal hypertrophy of septum, EF 60-65%, trivial aortic regurgitation   History of cardiac catheterization December 2006   for exertional chest pain: Nonobstructive CAD   History of migraine headaches    not an issues as of 07/2017   Onychomycosis 05/2015   Pneumococcal vaccine refused 05/2015   Past Surgical History:  Procedure Laterality Date   ABDOMINAL HYSTERECTOMY     PARTIAL (benign, for bleeding), still has ovaries   CAROTID ANGIOGRAM  2005   COLONOSCOPY  2009   Dr. Collene Mares   ESOPHAGOGASTRODUODENOSCOPY  2009   Dr. Collene Mares   EYE SURGERY     new lenses 07/2017   LAPAROSCOPY  12/02   ovarian cyst   LEFT HEART CATH AND CORONARY ANGIOGRAPHY  12/06   Dr. Rex Kras; no significant CAD. Normal LV function with EF of 60-65%. Also  noted no RAS.   NM MYOVIEW LTD  12/2014   LOW RISK.  EF 63%.  Fixed Small size mild intensity distal anteroapical defect suggestive of breast attenuation.  No reversibility. = Breast attenuation.  NO INFARCT OR ISCHEMIA.   TRANSTHORACIC ECHOCARDIOGRAM  12/2014   EF 60 to 65%.  Normal wall motion and no valvular lesions.  Normal LV filling pressures.   Family History  Problem Relation Age of Onset   Heart disease Mother        CHF   Hypertension Mother    Heart disease Father 33       MI   Heart disease Brother    Heart disease Maternal Aunt    Diabetes Maternal Aunt    Cancer Maternal Aunt        cervical   Heart disease Maternal Uncle    Diabetes Maternal Uncle    Heart disease Maternal Grandmother        CHF   Diabetes Maternal Grandmother    Heart disease Paternal Uncle    Heart disease Brother    Heart disease Brother    Social History   Socioeconomic History   Marital status: Married    Spouse name: Not on file   Number of children: 3   Years of education: Not on file   Highest education level: Not on file  Occupational History   Occupation: Scientist, 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   Drug use: No   Sexual activity: Never    Birth control/protection: None  Other Topics Concern   Not on file  Social History Narrative   Widowed 2016.  Lives with boyfriend of 7 years.  Was married 53 years with Judie Bonus, widowed. Still rides motorcycle.     Has been dipping snuff since she was a teenager.   She drinks 2-3 [place] Beers most days out of the week.  12/2021   Social Determinants of Health   Financial Resource  Strain: Low Risk  (01/19/2023)   Overall Financial Resource Strain (CARDIA)    Difficulty of Paying Living Expenses: Not hard at all  Food Insecurity: No Food Insecurity (01/19/2023)   Hunger Vital Sign    Worried About Running Out of Food in the Last Year: Never true    Ran Out of Food in the Last Year: Never true  Transportation Needs: No Transportation Needs (01/19/2023)   PRAPARE - Hydrologist (Medical): No    Lack of Transportation (Non-Medical): No  Physical Activity:  Inactive (01/19/2023)   Exercise Vital Sign    Days of Exercise per Week: 0 days    Minutes of Exercise per Session: 0 min  Stress: No Stress Concern Present (01/19/2023)   [place]    Feeling of Stress : Only a little  Social Connections: Moderately Integrated (01/19/2023)   Social Connection and Isolation Panel [NHANES]    Frequency of Communication with Friends and Family: More than three times a week    Frequency of Social Gatherings with Friends and Family: Once a week    Attends Religious Services: 1 to 4 times per year    Active Member of [employer] or Organizations: No    Attends Music therapist: Never    Marital Status: Living with partner    Tobacco Counseling Ready to quit: Not Answered Counseling given: Not Answered Tobacco comments: has been using since she was a teenager   Clinical Intake:  Pre-visit preparation completed: Yes  Pain : 0-10 Pain Score: 3  Pain Type: Chronic pain Pain Location: Leg Pain Orientation:  (both legs and feet/swelling) Pain Descriptors / Indicators: Burning, Aching Pain Onset: More than a month ago Pain Frequency: Intermittent Pain Relieving Factors: PT helps but cannot afford Effect of Pain on Daily Activities: can't walk far, uses cane &walker  Pain Relieving Factors: PT helps but cannot afford  BMI - recorded: 31.64 Nutritional Status: BMI > 30   Obese Nutritional Risks: None Diabetes: No  How often do you need to have someone help you when you read instructions, pamphlets, or other written materials from your doctor or pharmacy?: 1 - Never  Diabetic?no  Interpreter Needed?: No  Information entered by :: B.Gareth Fitzner,LPN   Activities of Daily Living    01/19/2023    2:48 PM  In your present state of health, do you have any difficulty performing the following activities:  Hearing? 0  Vision? 0  Difficulty concentrating or making decisions? 0  Walking or climbing stairs? 1  Dressing or bathing? 0  Doing errands, shopping? 1  Comment cannot walk far fear of falling  Preparing Food and eating ? N  Using the Toilet? N  In the past six months, have you accidently leaked urine? N  Do you have problems with loss of bowel control? N  Managing your Medications? N  Managing your Finances? N  Housekeeping or managing your Housekeeping? Y    Patient Care Team: Tysinger, Leward Quan as PCP - General (Family Medicine) Leonie Man, MD as PCP - Cardiology (Cardiology)  Indicate any recent Medical Services you may have received from other than [facility] providers in the past year (date may be approximate).     Assessment:   This is a routine wellness examination for [name].  Hearing/Vision screen Hearing Screening - Comments:: Hears adequately Vision Screening - Comments:: Lazer surgery 2017 vision adequate  Dietary issues and exercise activities discussed: Current Exercise Habits: The patient does not participate in regular exercise at present, Exercise limited by: neurologic condition(s) (wants neurology referral)   Goals Addressed             This Visit's Progress    Prevent falls       Keep home pathways clear       Depression Screen    01/19/2023    2:41 PM 01/15/2022    9:31 AM 02/23/2020    9:04 AM 09/21/2018   11:02 AM 08/05/2017   10:32 AM 10/30/2015  3:08 PM  PHQ 2/9 Scores  PHQ - 2 Score 0 2 0 3 0 0   PHQ- 9 Score  6  15      Fall Risk    01/19/2023    2:35 PM 01/15/2022    9:30 AM 02/23/2020    9:04 AM 09/21/2018   11:02 AM 08/05/2017   10:32 AM  Fall Risk   Falls in the past year? 0 0 0 No Yes  Number falls in past yr: 0 0   1  Injury with Fall? 0 0   Yes  Risk for fall due to : Impaired balance/gait;Impaired mobility No Fall Risks     Follow up Education provided;Falls prevention discussed Falls evaluation completed       FALL RISK PREVENTION PERTAINING TO THE HOME:  Any stairs in or around the home? Yes  If so, are there any without handrails? Yes  Home free of loose throw rugs in walkways, pet beds, electrical cords, etc? Yes  Adequate lighting in your home to reduce risk of falls? Yes   ASSISTIVE DEVICES UTILIZED TO PREVENT FALLS:  Life alert? Yes  Use of a cane, walker or w/c? Yes  Grab bars in the bathroom? Yes  Shower chair or bench in shower? Yes  Elevated toilet seat or a handicapped toilet? No     01/19/2023    2:56 PM  6CIT Screen  What Year? 0 points  What month? 0 points  What time? 0 points  Count back from 20 0 points  Months in reverse 0 points  Repeat phrase 0 points  Total Score 0 points    Immunizations Immunization History  Administered Date(s) Administered   Fluad Quad(high Dose 65+) 10/09/2020, 01/15/2022   Influenza, High Dose Seasonal PF 01/19/2017, 11/22/2019   Influenza-Unspecified 11/22/2019   PFIZER(Purple Top)SARS-COV-2 Vaccination 07/25/2020, 08/16/2020   Pneumococcal Conjugate-13 01/19/2017   Pneumococcal Polysaccharide-23 11/04/2005   Td 11/04/2005   Tdap 09/21/2011, 08/01/2016    TDAP status: Up to date  Flu Vaccine status: Declined, Education has been provided regarding the importance of this vaccine but patient still declined. Advised may receive this vaccine at local pharmacy or Health Dept. Aware to provide a copy of the vaccination record if obtained from local pharmacy or Health Dept. Verbalized acceptance and  understanding.  Pneumococcal vaccine status: Up to date  Covid-19 vaccine status: Completed vaccines  Qualifies for Shingles Vaccine? Yes   Zostavax completed No   Shingrix Completed?: No.    Education has been provided regarding the importance of this vaccine. Patient has been advised to call insurance company to determine out of pocket expense if they have not yet received this vaccine. Advised may also receive vaccine at local pharmacy or Health Dept. Verbalized acceptance and understanding.  Screening Tests Health Maintenance  Topic Date Due   Hepatitis C Screening  Never done   Zoster Vaccines- Shingrix (1 of 2) Never done   Pneumonia Vaccine 66+ Years old (3 - PPSV23 or PCV20) 01/19/2022   COVID-19 Vaccine (3 - 2023-24 season) 08/28/2022   INFLUENZA VACCINE  03/28/2023 (Originally 07/28/2022)   Medicare Annual Wellness (AWV)  01/20/2024   MAMMOGRAM  06/11/2024   Fecal DNA (Cologuard)  03/10/2025   DTaP/Tdap/Td (4 - Td or Tdap) 08/01/2026   DEXA SCAN  Completed   HPV VACCINES  Aged Out   COLONOSCOPY (Pts 45-67yr Insurance coverage will need to be confirmed)  Discontinued    Health Maintenance  Health Maintenance Due  Topic Date  Due   Hepatitis C Screening  Never done   Zoster Vaccines- Shingrix (1 of 2) Never done   Pneumonia Vaccine 41+ Years old (3 - PPSV23 or PCV20) 01/19/2022   COVID-19 Vaccine (3 - 2023-24 season) 08/28/2022    Colorectal cancer screening: Type of screening: Cologuard. Completed yes. Repeat every 3 years  Mammogram status: Completed yes. Repeat every year  Bone Density status: Completed yes. Results reflect: Bone density results: NORMAL. Repeat every 5 years.  Lung Cancer Screening: (Low Dose CT Chest recommended if Age 42-80 years, 30 pack-year currently smoking OR have quit w/in 15years.) does not qualify.   Lung Cancer Screening Referral: no  Additional Screening:  Hepatitis C Screening: does not qualify; Completed no  Vision Screening:  Recommended annual ophthalmology exams for early detection of glaucoma and other disorders of the eye. Is the patient up to date with their annual eye exam?  No  Who is the provider or what is the name of the office in which the patient attends annual eye exams? Pt sts she had laser surgery years ago and does not need visits If pt is not established with a provider, would they like to be referred to a provider to establish care? No .   Dental Screening: Recommended annual dental exams for proper oral hygiene  Community Resource Referral / Chronic Care Management: CRR required this visit?  No   CCM required this visit?  Yes    Plan:     I have personally reviewed and noted the following in the patient's chart:   Medical and social history Use of alcohol, tobacco or illicit drugs  Current medications and supplements including opioid prescriptions. Patient is not currently taking opioid prescriptions. Functional ability and status Nutritional status Physical activity Advanced directives List of other physicians Hospitalizations, surgeries, and ER visits in previous 12 months Vitals Screenings to include cognitive, depression, and falls Referrals and appointments  In addition, I have reviewed and discussed with patient certain preventive protocols, quality metrics, and best practice recommendations. A written personalized care plan for preventive services as well as general preventive health recommendations were provided to patient.     Roger Shelter, LPN   [id number]   Nurse Notes: Pt wants referral to neurologist. Pt also states awaiting assistance (coupon) for help with cost of [place]. She has not not started due to cost. Referral placed : pt needs help with affording medication and PT.  I have read and agreed to the contents of this note Dorothea Ogle, PA-C

## 2023-01-21 ENCOUNTER — Other Ambulatory Visit: Payer: Self-pay | Admitting: Cardiology

## 2023-01-22 ENCOUNTER — Telehealth: Payer: Self-pay | Admitting: Pharmacist

## 2023-01-22 ENCOUNTER — Other Ambulatory Visit: Payer: Self-pay | Admitting: Internal Medicine

## 2023-01-22 ENCOUNTER — Telehealth: Payer: Self-pay | Admitting: *Deleted

## 2023-01-22 ENCOUNTER — Other Ambulatory Visit: Payer: Self-pay | Admitting: Medical

## 2023-01-22 DIAGNOSIS — R269 Unspecified abnormalities of gait and mobility: Secondary | ICD-10-CM

## 2023-01-22 MED ORDER — REPATHA 140 MG/ML ~~LOC~~ SOSY
140.0000 mg | PREFILLED_SYRINGE | SUBCUTANEOUS | 5 refills | Status: DC
Start: 2023-01-22 — End: 2023-01-25

## 2023-01-22 NOTE — Progress Notes (Signed)
[facility])  [place] Team    01/22/2023  RICHIE VADALA 15-Mar-1951 [id number]  Reason for referral: Medication Assistance with Reptha  Referral source: Dr. Glade Lloyd  Current insurance: [employer]  Outreach:  Successful telephone call with Ms. Reyonna Haack.  HIPAA identifiers verified.   Medication Assistance Findings:  Medication assistance needs identified: Reptha  Enrolled Ms. Archie Balboa with [place] for [place] today via the [place] provider portal.   Patient is approved from: 12/23/2022 -12/23/2023 HealthWell ID: [id number] Card ID: [id number] BIN: [id number] PCN: [id number] Group: [id number]  Per patient, she receives medication via [facility] mail order and would prefer to receive her medications via a delivery service. The [employer] card must be processed via a retial pharmacy, will check with [facility] outpatient services for delivery to patient.   Patient is aware that I will follow up with her by Monday for next steps.   Loretha Brasil, PharmD [place] Clinical Pharmacist Direct Dial: [phone]

## 2023-01-22 NOTE — Progress Notes (Signed)
[facility]) [place]   01/22/2023  [name] [birth year] [id number]  Reason for referral: Medication Assistance with Repatha  Referral source: Dr. Glade Lloyd  Current insurance: [facility]  Reason for call: Screen for HealthWell enrollment   Outreach:  Unsuccessful telephone call attempt #1 to patient.   HIPAA compliant voicemail left requesting a return call  Plan:  -I will make another outreach attempt to patient within 3-4 business days.  Thank you for allowing [facility] pharmacy to be a part of this patient's care.   Loretha Brasil, PharmD [place] Clinical Pharmacist Direct Dial: [phone]

## 2023-01-22 NOTE — Progress Notes (Signed)
  Care Coordination   Note   01/22/2023 Name: [name] MRN: [id number] DOB: 05/23/1951  [name] is a 72 y.o. year old female who sees Tysinger, Camelia Eng, PA-C for primary care. I reached out to [name] by phone today to offer care coordination services.  [name] was given information about Care Coordination services today including:   The Care Coordination services include support from the care team which includes your Nurse Coordinator, Clinical Social Worker, or Pharmacist.  The Care Coordination team is here to help remove barriers to the health concerns and goals most important to you. Care Coordination services are voluntary, and the patient may decline or stop services at any time by request to their care team member.   Care Coordination Consent Status: Patient agreed to services and verbal consent obtained.   Follow up plan:  Telephone appointment with care coordination team member scheduled for:  01/26/23 [place]: [phone]

## 2023-01-22 NOTE — Progress Notes (Signed)
  Care Coordination  Outreach Note  01/22/2023 Name: [name] MRN: [id number] DOB: [birth year]   Care Coordination Outreach Attempts: An unsuccessful telephone outreach was attempted today to offer the patient information about available care coordination services as a benefit of their health plan.   Follow Up Plan:  Additional outreach attempts will be made to offer the patient care coordination information and services.   Encounter Outcome:  No Answer  [place]  Direct Dial: [phone]

## 2023-01-25 ENCOUNTER — Other Ambulatory Visit: Payer: Self-pay | Admitting: Medical

## 2023-01-25 ENCOUNTER — Telehealth: Payer: Self-pay | Admitting: Pharmacist

## 2023-01-25 ENCOUNTER — Other Ambulatory Visit (HOSPITAL_COMMUNITY): Payer: Self-pay

## 2023-01-25 MED ORDER — REPATHA 140 MG/ML ~~LOC~~ SOSY
140.0000 mg | PREFILLED_SYRINGE | SUBCUTANEOUS | 5 refills | Status: DC
  Filled 2023-01-25: qty 2, 28d supply, fill #0
  Filled 2023-02-22: qty 2, 28d supply, fill #1
  Filled 2023-03-05: qty 6, 84d supply, fill #1
  Filled 2023-05-12: qty 4, 56d supply, fill #2

## 2023-01-25 NOTE — Progress Notes (Signed)
[place] [facility] [facility])                                            [place] Team    01/25/2023  TREY BEBEE 1951-08-22 [id number]  Call placed to [place] to coordinate Repatha prescription and [place]. Spoke with Nira Conn, who informs that a PA was sent to [employer], PA-C this morning. Once PA is approved, pharmacy buyer will order medication for the patient.  The first fill of Repatha will be for a one month supply only, a new script for 90 days will need to be issued for future orders.  I have sent a message back to Chana Bode, PA-C regarding the PA.   Call placed to Ms. Archie Balboa. Relayed the above information. Provided patient with the phone number to [facility] [phone].  Patient informs that she will be following up with PCP next week. Patient inquires about how to inject, will attempt to send video link through [place]. All questions answered. Patient was appreciative of my call.   Loretha Brasil, PharmD [place] Clinical Pharmacist Direct Dial: [phone]

## 2023-01-26 ENCOUNTER — Ambulatory Visit: Payer: Self-pay

## 2023-01-26 ENCOUNTER — Telehealth: Payer: Self-pay | Admitting: Medical

## 2023-01-26 ENCOUNTER — Other Ambulatory Visit: Payer: Self-pay

## 2023-01-26 NOTE — Telephone Encounter (Signed)
P.A. REPATHA completed & approved til 12/28/23, left message for [employer]

## 2023-01-26 NOTE — Patient Outreach (Signed)
  Care Coordination   Initial Visit Note   01/26/2023 Name: LAKYN ALSTEEN MRN: [id number] DOB: [birth year]  SOPHYA VANBLARCOM is a 72 y.o. year old female who sees Tysinger, Camelia Eng, PA-C for primary care. I spoke with  [name] by phone today.  What matters to the patients health and wellness today?  I want to walk better    Goals Addressed             This Visit's Progress    COMPLETED: Care Coordination Activities       Care Coordination Interventions: SdoH screening performed - no acute resource challenges identified at this time Discussed patient is actively working with pharmacy team regarding Repatha needs Reviewed role of care coordination team - patient agreeable to engage with [place] Scheduled initial call with RN Care Manager 2/15 at 2pm         SDOH assessments and interventions completed:  Yes  SDOH Interventions Today    Flowsheet Row Most Recent Value  SDOH Interventions   Food Insecurity Interventions Intervention Not Indicated  Housing Interventions Intervention Not Indicated  Transportation Interventions Intervention Not Indicated  Utilities Interventions Intervention Not Indicated        Care Coordination Interventions:  Yes, provided   Follow up plan: Follow up call scheduled for 2/15 with RN Care Manager    Encounter Outcome:  Pt. Visit Completed   Daneen Schick, Arita Miss, CDP Social Worker, Certified Dementia Practitioner [place] Management  Care Coordination [phone]

## 2023-01-26 NOTE — Patient Instructions (Signed)
Visit Information  Thank you for taking time to visit with me today. Please don't hesitate to contact me if I can be of assistance to you.   Following are the goals we discussed today:   Goals Addressed             This Visit's Progress    COMPLETED: Care Coordination Activities       Care Coordination Interventions: SdoH screening performed - no acute resource challenges identified at this time Discussed patient is actively working with pharmacy team regarding Repatha needs Reviewed role of care coordination team - patient agreeable to engage with [place] Scheduled initial call with RN Care Manager 2/15 at 2pm         Your next appointment is by telephone on 2/15 at 2pm with [place]  Please call the care guide team at [phone] if you need to cancel or reschedule your appointment.   If you are experiencing a Mental Health or [place] or need someone to talk to, please call 911  Patient verbalizes understanding of instructions and care plan provided today and agrees to view in [place]. Active MyChart status and patient understanding of how to access instructions and care plan via MyChart confirmed with patient.     Telephone follow up appointment with care management team member scheduled for:2/15  Daneen Schick, Arita Miss, CDP Social Worker, Certified Dementia Practitioner [place] Management  Care Coordination [phone]

## 2023-01-27 ENCOUNTER — Telehealth: Payer: Self-pay | Admitting: Pharmacist

## 2023-01-27 ENCOUNTER — Other Ambulatory Visit (HOSPITAL_COMMUNITY): Payer: Self-pay

## 2023-01-27 NOTE — Progress Notes (Signed)
[place] [facility])                                            [place] Team    01/27/2023  [name] 10-23-1951 [id number]  Returned telephone call to Mickel Baas re: PA approval for Repatha.   Placed telephone call to [facility], spoke with [name] who verifies that Repatha is on order and should be delivered to patient's home by Thursday of this week or Monday of next week. Cost for Repatha was $0.   Placed call to patient and relayed information. [place] had already informed her of the above. Patient was appreciative of the call.     [facility] pharmacy referral is being closed due to the following reasons:  - Referral is complete.  -I have provided my contact information if patient or family needs to reach out to me in the future.  -Thank you for allowing [facility] pharmacy to be involved in this patient's care.   [place] is happy to assist the patient/family in the future for clinical pharmacy needs, following a discussion from your team about [place] outreach. Thank you for allowing [facility] to be a part of your patient's care.   Loretha Brasil, PharmD [place] Clinical Pharmacist Direct Dial: [phone]

## 2023-01-27 NOTE — Telephone Encounter (Signed)
[facility] & they reran the [place] & went thru for $47 & [employer] paid rest of co pay they will handle the rest

## 2023-02-03 ENCOUNTER — Other Ambulatory Visit (HOSPITAL_COMMUNITY): Payer: Self-pay

## 2023-02-04 ENCOUNTER — Ambulatory Visit (INDEPENDENT_AMBULATORY_CARE_PROVIDER_SITE_OTHER): Payer: Medicare PPO | Admitting: Medical

## 2023-02-04 VITALS — BP 122/72 | HR 60 | Ht 63.0 in | Wt 174.6 lb

## 2023-02-04 DIAGNOSIS — K760 Fatty (change of) liver, not elsewhere classified: Secondary | ICD-10-CM

## 2023-02-04 DIAGNOSIS — R7301 Impaired fasting glucose: Secondary | ICD-10-CM | POA: Diagnosis not present

## 2023-02-04 DIAGNOSIS — Z720 Tobacco use: Secondary | ICD-10-CM

## 2023-02-04 DIAGNOSIS — I10 Essential (primary) hypertension: Secondary | ICD-10-CM | POA: Diagnosis not present

## 2023-02-04 DIAGNOSIS — R0989 Other specified symptoms and signs involving the circulatory and respiratory systems: Secondary | ICD-10-CM

## 2023-02-04 DIAGNOSIS — I2510 Atherosclerotic heart disease of native coronary artery without angina pectoris: Secondary | ICD-10-CM

## 2023-02-04 DIAGNOSIS — G8929 Other chronic pain: Secondary | ICD-10-CM

## 2023-02-04 DIAGNOSIS — Z780 Asymptomatic menopausal state: Secondary | ICD-10-CM

## 2023-02-04 DIAGNOSIS — Z0000 Encounter for general adult medical examination without abnormal findings: Secondary | ICD-10-CM

## 2023-02-04 DIAGNOSIS — M79604 Pain in right leg: Secondary | ICD-10-CM

## 2023-02-04 DIAGNOSIS — M79605 Pain in left leg: Secondary | ICD-10-CM

## 2023-02-04 DIAGNOSIS — Z131 Encounter for screening for diabetes mellitus: Secondary | ICD-10-CM

## 2023-02-04 DIAGNOSIS — R2681 Unsteadiness on feet: Secondary | ICD-10-CM

## 2023-02-04 DIAGNOSIS — Z8249 Family history of ischemic heart disease and other diseases of the circulatory system: Secondary | ICD-10-CM

## 2023-02-04 DIAGNOSIS — M545 Low back pain, unspecified: Secondary | ICD-10-CM

## 2023-02-04 DIAGNOSIS — E559 Vitamin D deficiency, unspecified: Secondary | ICD-10-CM

## 2023-02-04 DIAGNOSIS — F1011 Alcohol abuse, in remission: Secondary | ICD-10-CM

## 2023-02-04 DIAGNOSIS — Z9989 Dependence on other enabling machines and devices: Secondary | ICD-10-CM

## 2023-02-04 DIAGNOSIS — R202 Paresthesia of skin: Secondary | ICD-10-CM

## 2023-02-04 DIAGNOSIS — Z9181 History of falling: Secondary | ICD-10-CM

## 2023-02-04 DIAGNOSIS — E785 Hyperlipidemia, unspecified: Secondary | ICD-10-CM

## 2023-02-04 DIAGNOSIS — M5410 Radiculopathy, site unspecified: Secondary | ICD-10-CM

## 2023-02-04 LAB — POCT URINALYSIS DIP (PROADVANTAGE DEVICE)
Bilirubin, UA: NEGATIVE
Blood, UA: NEGATIVE
Glucose, UA: NEGATIVE mg/dL
Ketones, POC UA: NEGATIVE mg/dL
Leukocytes, UA: NEGATIVE
Nitrite, UA: NEGATIVE
Protein Ur, POC: NEGATIVE mg/dL
Specific Gravity, Urine: 1.01
Urobilinogen, Ur: NEGATIVE
pH, UA: 6.5 (ref 5.0–8.0)

## 2023-02-04 NOTE — Progress Notes (Signed)
They just started doing them again! They just require a consultation first and then their physicians will order it and do it here

## 2023-02-04 NOTE — Progress Notes (Signed)
Subjective:    [name] is a 72 y.o. female who presents for Preventative Services visit and chronic medical problems/med check visit.    Chief Complaint  Patient presents with   fasting cpe    Fasting cpe, legs giving out and toes are burning. Declines shots today. No other concerns    Primary Care Provider Magali Bray, Camelia Eng, PA-C here for primary care  Current Health Care Team: Dentist, none Eye doctor, none Dr. Glenetta Hew, cardiology  Concerns: Her main concern is unsteadiness on feet, legs get weak and give out, toes burning sensation.  Feels off balance at times, has to be careful not to fall, but no fall in past 12 months  We have discussed this before, and she was referred to PT.  She did a few sessions of physical therapy which helped but she states she can afford to go do PT.  Uses cane daily  Still drinks 3 miller lights per day  Still dips snuff  Doesn't tolerate statins.   Has repatha and is getting ready to start this week    Depression Screen    01/19/2023    2:41 PM  Depression screen PHQ 2/9  Decreased Interest 0  Down, Depressed, Hopeless 0  PHQ - 2 Score 0    Fall Risk Screen    01/19/2023    2:35 PM 01/15/2022    9:30 AM 02/23/2020    9:04 AM 09/21/2018   11:02 AM 08/05/2017   10:32 AM  [place] in the past year? 0 0 0 No Yes  Number falls in past yr: 0 0   1  Injury with Fall? 0 0   Yes  Risk for fall due to : Impaired balance/gait;Impaired mobility No Fall Risks     Follow up Education provided;Falls prevention discussed Falls evaluation completed      Past Medical History:  Diagnosis Date   Allergic rhinitis, cause unspecified    Arthritis    Chewing tobacco use    Coronary artery disease, non-occlusive December 2006   Dyslipidemia    Essential hypertension    previously uncontrolled, now well-controlled; no evidence of renal artery stenosis   GERD (gastroesophageal reflux disease)    H/O echocardiogram 12/2014    60-65 % EF, mild basal hypertrophy focally at septum, otherwise normal   Heart murmur 12/2014   mild focal hypertrophy of septum, EF 60-65%, trivial aortic regurgitation   History of cardiac catheterization December 2006   for exertional chest pain: Nonobstructive CAD   History of migraine headaches    not an issues as of 07/2017   Onychomycosis 05/2015   Pneumococcal vaccine refused 05/2015    Past Surgical History:  Procedure Laterality Date   ABDOMINAL HYSTERECTOMY     PARTIAL (benign, for bleeding), still has ovaries   CAROTID ANGIOGRAM  2005   COLONOSCOPY  2009   Dr. Collene Mares   ESOPHAGOGASTRODUODENOSCOPY  2009   Dr. Collene Mares   EYE SURGERY     new lenses 07/2017   LAPAROSCOPY  12/02   ovarian cyst   LEFT HEART CATH AND CORONARY ANGIOGRAPHY  12/06   Dr. Rex Kras; no significant CAD. Normal LV function with EF of 60-65%. Also noted no RAS.   NM MYOVIEW LTD  12/2014   LOW RISK.  EF 63%.  Fixed Small size mild intensity distal anteroapical defect suggestive of breast attenuation.  No reversibility. = Breast attenuation.  NO INFARCT OR ISCHEMIA.   TRANSTHORACIC ECHOCARDIOGRAM  12/2014  EF 60 to 65%.  Normal wall motion and no valvular lesions.  Normal LV filling pressures.    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   Drug use: No   Sexual activity: Never    Birth control/protection: None  Other Topics Concern    Not on file  Social History Narrative   Widowed 2016.  Lives with boyfriend of 7 years.  Was married 87 years with Judie Bonus, widowed. Still rides motorcycle.     Has been dipping snuff since she was a teenager.   She drinks 2-3 [place] Beers most days out of the week.  12/2021   Social Determinants of Health   Financial Resource Strain: Low Risk  (01/19/2023)   Overall Financial Resource Strain (CARDIA)    Difficulty of Paying Living Expenses: Not hard at all  Food Insecurity: No Food Insecurity (01/26/2023)   Hunger Vital Sign    Worried About Running Out of Food in the Last Year: Never true    Ran Out of Food in the Last Year: Never true  Transportation Needs: No Transportation Needs (01/26/2023)   PRAPARE - Hydrologist (Medical): No    Lack of Transportation (Non-Medical): No  Physical Activity: Inactive (01/19/2023)   Exercise Vital Sign    Days of Exercise per Week: 0 days    Minutes of Exercise per Session: 0 min  Stress: No Stress Concern Present (01/19/2023)   [place]    Feeling of Stress : Only a little  Social Connections: Moderately Integrated (01/19/2023)   Social Connection and Isolation Panel [NHANES]    Frequency of Communication with Friends and Family: More than three times a week    Frequency of Social Gatherings with Friends and Family: Once a week    Attends Religious Services: 1 to 4 times per year    Active Member of [employer] or Organizations: No    Attends Archivist Meetings: Never    Marital Status: Living with partner  Intimate Partner Violence: Not At Risk (01/19/2023)   Humiliation, Afraid, Rape, and Kick questionnaire    Fear of Current or Ex-Partner: No    Emotionally Abused: No    Physically Abused: No    Sexually Abused: No    Family History  Problem Relation Age of Onset   Heart disease Mother        CHF   Hypertension Mother     Heart disease Father 59       MI   Heart disease Brother    Heart disease Maternal Aunt    Diabetes Maternal Aunt    Cancer Maternal Aunt        cervical   Heart disease Maternal  Uncle    Diabetes Maternal Uncle    Heart disease Maternal Grandmother        CHF   Diabetes Maternal Grandmother    Heart disease Paternal Uncle    Heart disease Brother    Heart disease Brother      Current Outpatient Medications:    amLODipine (NORVASC) 5 MG tablet, TAKE 1 TABLET EVERY DAY, Disp: 30 tablet, Rfl: 3   hydrochlorothiazide (HYDRODIURIL) 25 MG tablet, Take 1 tablet (25 mg total) by mouth daily., Disp: 90 tablet, Rfl: 3   valsartan (DIOVAN) 80 MG tablet, Take 1 tablet (80 mg total) by mouth daily. Pt needs to schedule appt with provider for further refills - 1st attempt, Disp: 30 tablet, Rfl: 0   Evolocumab (REPATHA) 140 MG/ML SOSY, Inject 140 mg into the skin every 14 (fourteen) days. (Patient not taking: Reported on 02/04/2023), Disp: 2.1 mL, Rfl: 5  Allergies  Allergen Reactions   Covid-19 (Mrna) Vaccine     Adverse reaction   Morphine And Related Other (See Comments)    Palpitations and shaking.    History reviewed: allergies, current medications, past family history, past medical history, past social history, past surgical history and problem list   Chronic issues discussed: Hypertension -  Compliant with medications  Acute issues discussed: No acute issues    Objective:     BP 122/72   Pulse 60   Ht 5\' 3"  (1.6 m)   Wt 174 lb 9.6 oz (79.2 kg)   LMP  (LMP Unknown)   BMI 30.93 kg/m   Wt Readings from Last 3 Encounters:  02/04/23 174 lb 9.6 oz (79.2 kg)  01/19/23 173 lb (78.5 kg)  01/15/22 173 lb 3.2 oz (78.6 kg)   BP Readings from Last 3 Encounters:  02/04/23 122/72  01/15/22 138/82  10/09/20 124/80   General: Well-developed well-nourished no acute distress, white female alert and oriented x3 Answers questions appropriately, recall normal, can perform simple  calculations HEENT: normocephalic, sclerae anicteric, TMs pearly, nares patent, no discharge or erythema, pharynx normal Neck: supple, no lymphadenopathy, no thyromegaly, no masses, no bruits Heart: 2 out of 6 brief holosystolic murmur heard in upper sternal borders, RRR, normal S1, S2 Lungs: CTA bilaterally, no wheezes, rhonchi, or rales Abdomen: +bs, soft, non tender, non distended, no masses, no hepatomegaly, no splenomegaly Musculoskeletal: limited exam, slow to get on and off exam table due to leg pain, limited mobility, nontender, no swelling, no obvious deformity Extremities: no edema, no cyanosis, no clubbing Pulses: 2+ symmetric, upper and lower extremities, normal cap refill Neurological: alert, oriented x 3, CN2-12 intact, strength normal upper extremities but decreased lower extremities, sensation normal throughout, DTRs 2+ throughout, no cerebellar signs, gait slow due to leg pain, ambulates with cane Psychiatric: normal affect, behavior normal, pleasant  Breast/pelvic - declined     Assessment:   Encounter Diagnoses  Name Primary?   Encounter for health maintenance examination in adult Yes   Essential hypertension, benign    Coronary artery disease involving native coronary artery of native heart without angina pectoris    Fatty liver    Impaired fasting blood sugar    Dyslipidemia, goal LDL below 70    Unsteadiness on feet    Radicular leg pain    Post-menopausal    Paresthesia    History of fall    History of alcohol abuse    Family history of premature CAD    Does mobilize using cane    Decreased pedal pulses  Chronic pain of both lower extremities    Chronic bilateral low back pain without sciatica    Chewing tobacco use    Screening for diabetes mellitus    Vitamin D deficiency      Plan:    This visit was a preventative care visit, also known as wellness visit or routine physical.   Topics typically include healthy lifestyle, diet, exercise,  preventative care, vaccinations, sick and well care, proper use of emergency dept and after hours care, as well as other concerns.     Recommendations: Continue to return yearly for your annual wellness and preventative care visits.  This gives [place] a chance to discuss healthy lifestyle, exercise, vaccinations, review your chart record, and perform screenings where appropriate.  I recommend you see your eye doctor yearly for routine vision care.  I recommend you see your dentist yearly for routine dental care including hygiene visits twice yearly.   Vaccination recommendations were reviewed Immunization History  Administered Date(s) Administered   Fluad Quad(high Dose 65+) 10/09/2020, 01/15/2022   Influenza, High Dose Seasonal PF 01/19/2017, 11/22/2019   Influenza-Unspecified 11/22/2019   PFIZER(Purple Top)SARS-COV-2 Vaccination 07/25/2020, 08/16/2020   Pneumococcal Conjugate-13 01/19/2017   Pneumococcal Polysaccharide-23 11/04/2005   Td 11/04/2005   Tdap 09/21/2011, 08/01/2016   Counseled on Shingrix, flu, pneumococcal.  She declines vaccines today   Screening for cancer: Colon cancer screening: Cologuard negative 02/2022.  Breast cancer screening: You should perform a self breast exam monthly.   We reviewed recommendations for regular mammograms and breast cancer screening.  Cervical cancer screening: You had a hysterectomy so you no longer need pap smear, but you should have a pelvic exam periodically   Skin cancer screening: Check your skin regularly for new changes, growing lesions, or other lesions of concern Come in for evaluation if you have skin lesions of concern.  Lung cancer screening: If you have a greater than 20 pack year history of tobacco use, then you may qualify for lung cancer screening with a chest CT scan.   Please call your insurance company to inquire about coverage for this test.  We currently don't have screenings for other cancers besides breast,  cervical, colon, and lung cancers.  If you have a strong family history of cancer or have other cancer screening concerns, please let me know.    Bone health: Get at least 150 minutes of aerobic exercise weekly Get weight bearing exercise at least once weekly Bone density test:  A bone density test is an imaging test that uses a type of X-ray to measure the amount of calcium and other minerals in your bones. The test may be used to diagnose or screen you for a condition that causes weak or thin bones (osteoporosis), predict your risk for a broken bone (fracture), or determine how well your osteoporosis treatment is working. The bone density test is recommended for females 79 and older, or females or males <95 if certain risk factors such as thyroid disease, long term use of steroids such as for asthma or rheumatological issues, vitamin D deficiency, estrogen deficiency, family history of osteoporosis, self or family history of fragility fracture in first degree relative.  Bone density normal 05/2022.   Heart health: Get at least 150 minutes of aerobic exercise weekly Limit alcohol It is important to maintain a healthy blood pressure and healthy cholesterol numbers  Heart disease screening: Screening for heart disease includes screening for blood pressure, fasting lipids, glucose/diabetes screening, BMI height to weight ratio, reviewed of  smoking status, physical activity, and diet.    Goals include blood pressure 120/80 or less, maintaining a healthy lipid/cholesterol profile, preventing diabetes or keeping diabetes numbers under good control, not smoking or using tobacco products, exercising most days per week or at least 150 minutes per week of exercise, and eating healthy variety of fruits and vegetables, healthy oils, and avoiding unhealthy food choices like fried food, fast food, high sugar and high cholesterol foods.    CT coronary calcium test 09/2020 IMPRESSION: Coronary calcium  score of 564. This was 83 percentile for age and sex matched control.  ABI normal 02/2021  Normal Echo 12/2014   Medical care options: I recommend you continue to seek care here first for routine care.  We try really hard to have available appointments Monday through Friday daytime hours for sick visits, acute visits, and physicals.  Urgent care should be used for after hours and weekends for significant issues that cannot wait till the next day.  The emergency department should be used for significant potentially life-threatening emergencies.  The emergency department is expensive, can often have long wait times for less significant concerns, so try to utilize primary care, urgent care, or telemedicine when possible to avoid unnecessary trips to the emergency department.  Virtual visits and telemedicine have been introduced since the pandemic started in 2020, and can be convenient ways to receive medical care.  We offer virtual appointments as well to assist you in a variety of options to seek medical care.   Advanced Directives: I recommend you consider completing a Health Care Power of Attorney and Living Will.   These documents respect your wishes and help alleviate burdens on your loved ones if you were to become terminally ill or be in a position to need those documents enforced.    You can complete Advanced Directives yourself, have them notarized, then have copies made for our office, for you and for anybody you feel should have them in safe keeping.  Or, you can have an attorney prepare these documents.   If you haven't updated your Last Will and Testament in a while, it may be worthwhile having an attorney prepare these documents together and save on some costs.      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   Reviewed labs from last year.  Referral to neurology  Ambulates with cane, limited  mobility  Hypertension-continue current medications, no current cardiac symptoms  GERD-no recent complaint  Chewing tobacco use-you are high risk for oral cancer.  I recommend you stop chewing tobacco.  I recommend you follow-up with dentist yearly for surveillance  Coronary artery disease, hyperlipidemia-she notes she will start Repatha this week.    Fatty liver disease-I recommend weight loss to healthy low-fat low sugar diet and regular exercise.  Alcohol drinker-I recommend you quit or cut way back on alcohol.  There is no safe amount.     Impaired glucose-updated labs today    Sabriyah was seen today for fasting cpe.  Diagnoses and all orders for this visit:  Encounter for health maintenance examination in adult -     Comprehensive metabolic panel -     CBC with Differential/Platelet -     Sedimentation rate -     ANA -     POCT Urinalysis DIP (Proadvantage Device) -     VITAMIN D 25 Hydroxy (Vit-D Deficiency, Fractures) -     Hepatitis  C antibody -     Uric acid -     Hemoglobin A1c  Essential hypertension, benign  Coronary artery disease involving native coronary artery of native heart without angina pectoris  Fatty liver  Impaired fasting blood sugar -     Hemoglobin A1c  Dyslipidemia, goal LDL below 70  Unsteadiness on feet -     Comprehensive metabolic panel -     CBC with Differential/Platelet -     Ambulatory referral to Neurology  Radicular leg pain  Post-menopausal  Paresthesia -     Comprehensive metabolic panel -     CBC with Differential/Platelet -     Sedimentation rate -     ANA -     Uric acid -     Ambulatory referral to Neurology  History of fall  History of alcohol abuse -     Ambulatory referral to Neurology  Family history of premature CAD  Does mobilize using cane -     Ambulatory referral to Neurology  Decreased pedal pulses  Chronic pain of both lower extremities -     Sedimentation rate -     ANA -     Uric  acid  Chronic bilateral low back pain without sciatica  Chewing tobacco use  Screening for diabetes mellitus -     Hemoglobin A1c  Vitamin D deficiency -     VITAMIN D 25 Hydroxy (Vit-D Deficiency, Fractures)  F/u pending labs

## 2023-02-05 ENCOUNTER — Other Ambulatory Visit: Payer: Self-pay | Admitting: Medical

## 2023-02-05 LAB — COMPREHENSIVE METABOLIC PANEL
ALT: 21 IU/L (ref 0–32)
AST: 22 IU/L (ref 0–40)
Albumin/Globulin Ratio: 1.4 (ref 1.2–2.2)
Albumin: 4.5 g/dL (ref 3.8–4.8)
Alkaline Phosphatase: 78 IU/L (ref 44–121)
BUN/Creatinine Ratio: 13 (ref 12–28)
BUN: 8 mg/dL (ref 8–27)
Bilirubin Total: 0.6 mg/dL (ref 0.0–1.2)
CO2: 24 mmol/L (ref 20–29)
Calcium: 9.2 mg/dL (ref 8.7–10.3)
Chloride: 98 mmol/L (ref 96–106)
Creatinine, Ser: 0.62 mg/dL (ref 0.57–1.00)
Globulin, Total: 3.2 g/dL (ref 1.5–4.5)
Glucose: 103 mg/dL — ABNORMAL HIGH (ref 70–99)
Potassium: 4.2 mmol/L (ref 3.5–5.2)
Sodium: 137 mmol/L (ref 134–144)
Total Protein: 7.7 g/dL (ref 6.0–8.5)
eGFR: 95 mL/min/{1.73_m2} (ref >59)

## 2023-02-05 LAB — VITAMIN D 25 HYDROXY (VIT D DEFICIENCY, FRACTURES): Vit D, 25-Hydroxy: 13.2 ng/mL — ABNORMAL LOW (ref 30.0–100.0)

## 2023-02-05 LAB — HEMOGLOBIN A1C
Est. average glucose Bld gHb Est-mCnc: 123 mg/dL
Hgb A1c MFr Bld: 5.9 % — ABNORMAL HIGH (ref 4.8–5.6)

## 2023-02-05 LAB — CBC WITH DIFFERENTIAL/PLATELET
Basophils Absolute: 0.1 10*3/uL (ref 0.0–0.2)
Basos: 1 %
EOS (ABSOLUTE): 0.1 10*3/uL (ref 0.0–0.4)
Eos: 1 %
Hematocrit: 41.7 % (ref 34.0–46.6)
Hemoglobin: 14 g/dL (ref 11.1–15.9)
Immature Grans (Abs): 0 10*3/uL (ref 0.0–0.1)
Immature Granulocytes: 0 %
Lymphocytes Absolute: 1.7 10*3/uL (ref 0.7–3.1)
Lymphs: 25 %
MCH: 32.6 pg (ref 26.6–33.0)
MCHC: 33.6 g/dL (ref 31.5–35.7)
MCV: 97 fL (ref 79–97)
Monocytes Absolute: 0.7 10*3/uL (ref 0.1–0.9)
Monocytes: 10 %
Neutrophils Absolute: 4.3 10*3/uL (ref 1.4–7.0)
Neutrophils: 63 %
Platelets: 209 10*3/uL (ref 150–450)
RBC: 4.3 x10E6/uL (ref 3.77–5.28)
RDW: 11.9 % (ref 11.7–15.4)
WBC: 6.9 10*3/uL (ref 3.4–10.8)

## 2023-02-05 LAB — HEPATITIS C ANTIBODY: Hep C Virus Ab: NONREACTIVE

## 2023-02-05 LAB — ANA: Anti Nuclear Antibody (ANA): POSITIVE — AB

## 2023-02-05 LAB — URIC ACID: Uric Acid: 5.4 mg/dL (ref 3.1–7.9)

## 2023-02-05 LAB — SEDIMENTATION RATE: Sed Rate: 26 mm/hr (ref 0–40)

## 2023-02-05 MED ORDER — AMLODIPINE BESYLATE 5 MG PO TABS: 5.0000 mg | ORAL_TABLET | Freq: Every day | ORAL | 2 refills | Status: DC

## 2023-02-05 MED ORDER — VITAMIN D 50 MCG (2000 UT) PO CAPS: 1.0000 | ORAL_CAPSULE | Freq: Every day | ORAL | 0 refills | Status: DC

## 2023-02-05 MED ORDER — HYDROCHLOROTHIAZIDE 25 MG PO TABS: 25.0000 mg | ORAL_TABLET | Freq: Every day | ORAL | 2 refills | Status: DC

## 2023-02-05 NOTE — Progress Notes (Signed)
Given the fact that she already has known coronary artery disease on scan she really just needs to start the [place].  I was not going to do lipids this week because she was getting ready to start cholesterol medicine and the plan will be to recheck cholesterol medicine when she has been on the Repatha 3 months

## 2023-02-05 NOTE — Progress Notes (Signed)
Results sent through My Chart  Schedule 3 months fasting follow up visit

## 2023-02-11 ENCOUNTER — Ambulatory Visit: Payer: Self-pay

## 2023-02-11 NOTE — Patient Instructions (Addendum)
Visit Information  Thank you for taking time to visit with me today. Please don't hesitate to contact me if I can be of assistance to you.   Following are the goals we discussed today:   Goals Addressed               This Visit's Progress     Patient Stated     To start Repatha to help manage Cholesterol levels (pt-stated)        Care Coordination Interventions: Provider established cholesterol goals reviewed Determined patient will start Repatha tomorrow, she will self inject Educated patient about this medication including indication and dosing Counseled on importance of regular laboratory monitoring as prescribed Reviewed importance of limiting foods high in cholesterol Reviewed exercise goals and target of 150 minutes per week Educated patient about PREP, patient is not a [facility] Care Management participate, therefore Medicare will not cover Determined patient will contact her health plan to inquire about other exercise programs covered by her health plan Mailed printed education materials related to Cholesterol management            Our next appointment is by telephone on 02/25/23 at 2:00 PM  Please call the care guide team at [phone] if you need to cancel or reschedule your appointment.   If you are experiencing a Mental Health or [place] or need someone to talk to, please call 1-800-273-TALK (toll free, 24 hour hotline) go to [facility] [address] [place] [phone])  Patient verbalizes understanding of instructions and care plan provided today and agrees to view in [place]. Active MyChart status and patient understanding of how to access instructions and care plan via MyChart confirmed with patient.     Barb Merino, RN, BSN, CCM Care Management Coordinator [facility] Care Management Direct Phone: [phone]

## 2023-02-11 NOTE — Patient Outreach (Signed)
  Care Coordination   Initial Visit Note   02/11/2023 Name: [name] MRN: [id number] DOB: January 30, 1951  [name] is a 72 y.o. year old female who sees Tysinger, Camelia Eng, PA-C for primary care. I spoke with  [name] by phone today.  What matters to the patients health and wellness today?  Patient would like to start Repatha and learn more about dietary recommendations to help lower her Cholesterol.     Goals Addressed               This Visit's Progress     Patient Stated     To start Repatha to help manage Cholesterol levels (pt-stated)        Care Coordination Interventions: Determined patient will start Repatha tomorrow, she will self inject Educated patient about this medication including indication and dosing Provider established cholesterol goals reviewed Counseled on importance of regular laboratory monitoring as prescribed Reviewed importance of limiting foods high in cholesterol Reviewed exercise goals and target of 150 minutes per week Educated patient about PREP, patient is not a [facility] Care Management participate, therefore Medicare will not cover Determined patient will contact her health plan to inquire about other exercise programs covered by her health plan Mailed printed education materials related to Cholesterol management    Interventions Today    Flowsheet Row Most Recent Value  Chronic Disease   Chronic disease during today's visit Diabetes, Other  [Hyperlipidemia]  General Interventions   General Interventions Discussed/Reviewed General Interventions Reviewed, General Interventions Discussed, Labs  Labs Hgb A1c every 3 months, Kidney Function  Exercise Interventions   Exercise Discussed/Reviewed Exercise Discussed, Physical Activity, Assistive device use and maintanence  Physical Activity Discussed/Reviewed PREP  Education Interventions   Education Provided Provided Printed Education          SDOH assessments and interventions  completed:  No     Care Coordination Interventions:  Yes, provided   Follow up plan: Follow up call scheduled for 02/25/23 @2 :00 PM    Encounter Outcome:  Pt. Visit Completed

## 2023-02-22 ENCOUNTER — Other Ambulatory Visit (HOSPITAL_COMMUNITY): Payer: Self-pay

## 2023-02-24 ENCOUNTER — Telehealth: Payer: Self-pay | Admitting: *Deleted

## 2023-02-24 ENCOUNTER — Ambulatory Visit: Payer: Self-pay

## 2023-02-24 DIAGNOSIS — I25709 Atherosclerosis of coronary artery bypass graft(s), unspecified, with unspecified angina pectoris: Secondary | ICD-10-CM

## 2023-02-24 DIAGNOSIS — I10 Essential (primary) hypertension: Secondary | ICD-10-CM

## 2023-02-24 NOTE — Patient Instructions (Addendum)
Visit Information  Thank you for taking time to visit with me today. Please don't hesitate to contact me if I can be of assistance to you.   Following are the goals we discussed today:   Goals Addressed               This Visit's Progress     Patient Stated     COMPLETED: To start Repatha to help manage Cholesterol levels (pt-stated)        Care Coordination Interventions: Discussed with patient she is experiencing difficulty getting her Repatha refilled by [facility] due to inadvertently receiving a shipment from [facility] mail order [facility] Provided patient with the contact number for [facility] and advised patient to contact [place] for assistance Sent secure message to [place] with an update regarding patient's medication issue; sent [phone] referral          If you are experiencing a Mental Health or [place] or need someone to talk to, please call 1-800-273-TALK (toll free, 24 hour hotline) go to [facility] [address], [place] [phone])  Patient verbalizes understanding of instructions and care plan provided today and agrees to view in [place]. Active MyChart status and patient understanding of how to access instructions and care plan via MyChart confirmed with patient.     Barb Merino, RN, BSN, CCM Care Management Coordinator [facility] Care Management  Direct Phone: [phone]

## 2023-02-24 NOTE — Progress Notes (Signed)
  Care Coordination Note  02/24/2023 Name: [name] MRN: [id number] DOB: 06-20-1951  [name] is a 72 y.o. year old female who is a primary care patient of Glade Lloyd, Leward Quan and is actively engaged with the care management team. I reached out to [name] by phone today to assist with re-scheduling a follow up visit with the RN Case Manager  Follow up plan: Unsuccessful telephone outreach attempt made. A HIPAA compliant phone message was left for the patient providing contact information and requesting a return call.  [place]  Direct Dial: [phone]

## 2023-02-24 NOTE — Patient Outreach (Addendum)
  Care Coordination   Follow Up Visit Note   02/24/2023 Name: [name] MRN: [id number] DOB: 08/10/1951  [name] is a 72 y.o. year old female who sees Tysinger, Camelia Eng, PA-C for primary care. I spoke with  [name] by phone today.  What matters to the patients health and wellness today?  Patient will contact [facility] pharmacist for assistance with refilling Repatha.     Goals Addressed               This Visit's Progress     Patient Stated     COMPLETED: To start Repatha to help manage Cholesterol levels (pt-stated)        Care Coordination Interventions: Discussed with patient she is experiencing difficulty getting her Repatha refilled by [facility] due to inadvertently receiving a shipment from [facility] mail order pharmcy Provided patient with the contact number for Tiffany [facility]field Pharm D and advised patient to contact [place] for assistance Sent secure message to [place] with an update regarding patient's medication issue; sent [phone] referral       Interventions Today    Flowsheet Row Most Recent Value  Chronic Disease   Chronic disease during today's visit Other  [CAD]  General Interventions   General Interventions Discussed/Reviewed Communication with  [place] D]  Pharmacy Interventions   Pharmacy Dicussed/Reviewed Medication Adherence, Affording Medications, Referral to Pharmacist  Medication Adherence Unable to refill medication          SDOH assessments and interventions completed:  No     Care Coordination Interventions:  Yes, provided   Follow up plan: No further intervention required. Referral made to [place], Pharm D    Encounter Outcome:  Pt. Visit Completed

## 2023-02-25 ENCOUNTER — Telehealth: Payer: Self-pay | Admitting: Pharmacist

## 2023-02-25 NOTE — Progress Notes (Signed)
[facility])                                            [place] Team    02/25/2023  BAHATI ZETTEL [birth year] [id number]  Telephonic outreach placed to Ms. Noon Sipp. Patient states she received a 90 day supply of Repatha from [facility] mail order pharmacy and pharmacy is attempting to charge her for this prescription. Patient has already notified [employer] that she is receiving Repatha through [employer] with a co-pay assistance from [employer] requested for patient to return the [place]. [facility] is unable to process Repatha due to [place] claim.   Placed a three way call with patient to [place] to discuss next steps. Per representative, patient must return Repatha before the claim can be reversed. Per rep, this takes about 7 to 14 business days. Patient will have [place] mailed back tomorrow, 02/26/2023. I will follow up with [facility] pharmacy a week from today to check on claim reversal. Will also follow up with patient. Patient has shot for tomorrow, will need next dose by 03/11/2023.  Patient appreciative of my call.    Loretha Brasil, PharmD [place] Clinical Pharmacist Direct Dial: [phone]

## 2023-03-05 ENCOUNTER — Other Ambulatory Visit (HOSPITAL_COMMUNITY): Payer: Self-pay

## 2023-03-05 ENCOUNTER — Telehealth: Payer: Self-pay | Admitting: Pharmacist

## 2023-03-05 NOTE — Progress Notes (Signed)
[facility])                                            [place] Team    03/05/2023  [name] 04-15-1951 [id number]  Placed call to [place] on behalf of [name]. Patient's Repatha is now eligible to be processed. Patient requested a 90 day supply. [facility] was able to process the 90 day supply for patient. Medication will have to be ordered and will ship next week.  Placed call to [name] and relayed information. Patient was appreciative of my call. Patient aware she may reach back out to me if issues arise in the future.  Loretha Brasil, PharmD [place] Clinical Pharmacist Direct Dial: [phone]

## 2023-03-15 ENCOUNTER — Other Ambulatory Visit: Payer: Self-pay | Admitting: Medical

## 2023-04-06 ENCOUNTER — Encounter: Payer: Self-pay | Admitting: Neurology

## 2023-04-06 ENCOUNTER — Ambulatory Visit: Payer: Medicare PPO | Admitting: Neurology

## 2023-04-06 VITALS — BP 174/90 | HR 66 | Ht 62.0 in | Wt 176.5 lb

## 2023-04-06 DIAGNOSIS — R531 Weakness: Secondary | ICD-10-CM

## 2023-04-06 DIAGNOSIS — R269 Unspecified abnormalities of gait and mobility: Secondary | ICD-10-CM

## 2023-04-06 DIAGNOSIS — R202 Paresthesia of skin: Secondary | ICD-10-CM

## 2023-04-06 DIAGNOSIS — R52 Pain, unspecified: Secondary | ICD-10-CM | POA: Insufficient documentation

## 2023-04-06 MED ORDER — DIAZEPAM 5 MG PO TABS: ORAL_TABLET | ORAL | 0 refills | Status: DC

## 2023-04-06 MED ORDER — DULOXETINE HCL 20 MG PO CPEP: 20.0000 mg | ORAL_CAPSULE | Freq: Every day | ORAL | 3 refills | Status: DC

## 2023-04-06 NOTE — Progress Notes (Addendum)
Chief Complaint  Patient presents with   New Patient (Initial Visit)    Rm 14, daughter present, neuropathy internal referral, pts daughter stated that pt has unsteady gait ongoing since 2021 but has increasingly gotten worse      ASSESSMENT AND PLAN  [name] is a 72 y.o. female   Slow worsening gait abnormality Bilateral feet paresthesia Muscle achy pain  Examination is limited by her muscle aching pain with deep palpitation, felt to have mild neck flexion, proximal upper and lower extremity muscle weakness, also bilateral toe flexion extension weakness, hyperreflexia, mildly length-dependent sensory loss,  Differentiation diagnosis included intrinsic muscle disease, peripheral neuropathy, she has difficulty initiate gait, could not rule out the possibility of central nervous system pathology, no significant parkinsonian features,  MRI of the brain  Laboratory evaluations including CPK, inflammatory markers,  EMG nerve conduction study    DIAGNOSTIC DATA (LABS, IMAGING, TESTING) - I reviewed patient records, labs, notes, testing and imaging myself where available.  MRI of the brain from [facility] May 04, 2023, no acute intracranial abnormality nonspecific white matter disease  MEDICAL HISTORY:  [name], is a 72 year old female, seen in request by her primary care PA Tysinger, Onalee Hua accompanied by her daughter at today's visit on April 06, 2023 for evaluation of gait abnormality  I reviewed and summarized the referring note. PMHX. HTN HLD Chew tobacco  She lives with her boyfriend at home, only drives short distance, since 2021, she noticed gradual onset of gait abnormality, described difficulty working her legs, had to look at her legs  to initiate gait,  She denies visual loss, denies double vision, swallowing difficulty or upper extremity weakness  She does have intermittent numbness tingling at plantar surface of her feet, denies bowel bladder  incontinence, chronic neck pain, denies significant low back pain  She also complains of bilateral knee pain, had left knee injection by orthopedic surgeon with limited help, often complains deep achy pain from bilateral knees down, there was noticeable muscle tenderness upon deep palpitation at today's examination  Laboratory evaluation in February 2024: A1c 5.9, normal uric acid, negative hepatitis C, vitamin D 13.2 on supplement now, positive ANA, ESR was 26, CBC hemoglobin of 14, CMP, creatinine of 0.62,   PHYSICAL EXAM:   Vitals:   04/06/23 0918 04/06/23 0921  BP: (!) 177/82 (!) 174/90  Pulse: 66 66  Weight: 176 lb 8 oz (80.1 kg)   Height: 5\' 2"  (1.575 m)    Not recorded     Body mass index is 32.28 kg/m.  PHYSICAL EXAMNIATION:  Gen: NAD, conversant, well nourised, well groomed                     Cardiovascular: Regular rate rhythm, no peripheral edema, warm, nontender. Eyes: Conjunctivae clear without exudates or hemorrhage Neck: Supple, no carotid bruits. Pulmonary: Clear to auscultation bilaterally   NEUROLOGICAL EXAM:  MENTAL STATUS: Speech/cognition: Awake, alert, oriented to history taking and casual conversation CRANIAL NERVES: CN II: Visual fields are full to confrontation. Pupils are round equal and briskly reactive to light. CN III, IV, VI: extraocular movement are normal. No ptosis. CN V: Facial sensation is intact to light touch CN VII: Face is symmetric with normal eye closure  CN VIII: Hearing is normal to causal conversation. CN IX, X: Phonation is normal. CN XI: Head turning and shoulder shrug are intact  MOTOR: Somewhat limited examination due to patient's deconditioning, and muscle pain upon deep palpitation,  felt she has mild neck flexion, bilateral shoulder abduction, hip flexion weakness, also has mild bilateral toe extension flexion weakness, no parkinsonian features  REFLEXES: Hyperreflexia, present at upper and lower extremity, absent at  ankle,  SENSORY: Preserved to toe vibratory sensation, mildly length-dependent light touch pinprick to ankle level  COORDINATION: There is no trunk or limb dysmetria noted.  GAIT/STANCE: Need push-up to get up from seated position, cautious, careful to initiate gait, unsteady  REVIEW OF SYSTEMS:  Full 14 system review of systems performed and notable only for as above All other review of systems were negative.   ALLERGIES: Allergies  Allergen Reactions   Covid-19 (Mrna) Vaccine     Adverse reaction   Morphine Other (See Comments)   Morphine And Related Other (See Comments)    Palpitations and shaking.    HOME MEDICATIONS: Current Outpatient Medications  Medication Sig Dispense Refill   amLODipine (NORVASC) 5 MG tablet TAKE 1 TABLET EVERY DAY 90 tablet 1   Cholecalciferol (VITAMIN D) 50 MCG (2000 UT) CAPS Take 1 capsule (2,000 Units total) by mouth daily. 90 capsule 0   Evolocumab (REPATHA) 140 MG/ML SOSY Inject 140 mg into the skin every 14 (fourteen) days. 2.1 mL 5   hydrochlorothiazide (HYDRODIURIL) 25 MG tablet Take 1 tablet (25 mg total) by mouth daily. 90 tablet 2   valsartan (DIOVAN) 80 MG tablet Take 1 tablet (80 mg total) by mouth daily. Pt needs to schedule appt with provider for further refills - 1st attempt 30 tablet 0   No current facility-administered medications for this visit.    PAST MEDICAL HISTORY: Past Medical History:  Diagnosis Date   Allergic rhinitis, cause unspecified    Arthritis    Chewing tobacco use    Coronary artery disease, non-occlusive December 2006   Dyslipidemia    Essential hypertension    previously uncontrolled, now well-controlled; no evidence of renal artery stenosis   GERD (gastroesophageal reflux disease)    H/O echocardiogram 12/2014   60-65 % EF, mild basal hypertrophy focally at septum, otherwise normal   Heart murmur 12/2014   mild focal hypertrophy of septum, EF 60-65%, trivial aortic regurgitation   History of cardiac  catheterization December 2006   for exertional chest pain: Nonobstructive CAD   History of migraine headaches    not an issues as of 07/2017   Onychomycosis 05/2015   Pneumococcal vaccine refused 05/2015    PAST SURGICAL HISTORY: Past Surgical History:  Procedure Laterality Date   ABDOMINAL HYSTERECTOMY     PARTIAL (benign, for bleeding), still has ovaries   CAROTID ANGIOGRAM  2005   COLONOSCOPY  2009   Dr. Loreta Ave   ESOPHAGOGASTRODUODENOSCOPY  2009   Dr. Loreta Ave   EYE SURGERY     new lenses 07/2017   LAPAROSCOPY  12/02   ovarian cyst   LEFT HEART CATH AND CORONARY ANGIOGRAPHY  12/06   Dr. Clarene Duke; no significant CAD. Normal LV function with EF of 60-65%. Also noted no RAS.   NM MYOVIEW LTD  12/2014   LOW RISK.  EF 63%.  Fixed Small size mild intensity distal anteroapical defect suggestive of breast attenuation.  No reversibility. = Breast attenuation.  NO INFARCT OR ISCHEMIA.   TRANSTHORACIC ECHOCARDIOGRAM  12/2014   EF 60 to 65%.  Normal wall motion and no valvular lesions.  Normal LV filling pressures.    FAMILY HISTORY: Family History  Problem Relation Age of Onset   Heart disease Mother  CHF   Hypertension Mother    Heart disease Father 63       MI   Heart disease Brother    Heart disease Maternal Aunt    Diabetes Maternal Aunt    Cancer Maternal Aunt        cervical   Heart disease Maternal Uncle    Diabetes Maternal Uncle    Heart disease Maternal Grandmother        CHF   Diabetes Maternal Grandmother    Heart disease Paternal Uncle    Heart disease Brother    Heart disease Brother     SOCIAL HISTORY: Social History   Socioeconomic History   Marital status: Married    Spouse name: Not on file   Number of children: 3   Years of education: Not on file   Highest education level: Not on file  Occupational History   Occupation: Conservation officer, 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   Drug use: No   Sexual activity: Never    Birth control/protection: None  Other Topics Concern   Not on file  Social History Narrative   Widowed 2016.  Lives with boyfriend of 7 years.  Was married 43 years with Deeann Cree, widowed. Still rides motorcycle.     Has been dipping snuff since she was a teenager.   She drinks 2-3 Sherilyn Dacosta Beers most days out of the week.  12/2021   Social Determinants of Health   Financial Resource Strain: Low Risk  (01/19/2023)   Overall Financial Resource Strain (CARDIA)    Difficulty of Paying Living Expenses: Not hard at all  Food Insecurity: No Food Insecurity (01/26/2023)   Hunger Vital Sign    Worried About Running Out of Food in the Last Year: Never true    Ran Out of Food in the Last Year: Never true  Transportation Needs: No Transportation Needs (01/26/2023)   PRAPARE - Administrator, Civil Service (Medical): No    Lack of Transportation (Non-Medical): No  Physical Activity: Inactive (01/19/2023)   Exercise Vital Sign    Days of Exercise per Week: 0 days    Minutes of Exercise per Session: 0 min  Stress: No Stress Concern Present (01/19/2023)   [employer] of Occupational Health - Occupational Stress Questionnaire    Feeling of Stress : Only a little  Social Connections: Moderately Integrated (01/19/2023)   Social Connection and Isolation Panel [NHANES]    Frequency of Communication with Friends and Family: More than three times a week    Frequency of Social Gatherings with Friends and Family: Once a week    Attends Religious Services: 1 to 4 times per year    Active Member of [employer] or Organizations: No    Attends Banker Meetings: Never     Marital Status: Living with partner  Intimate Partner Violence: Not At Risk (01/19/2023)   Humiliation, Afraid, Rape, and Kick questionnaire    Fear of Current or Ex-Partner: No    Emotionally Abused: No  Physically Abused: No    Sexually Abused: No      Levert Feinstein, M.D. Ph.D.  [facility] [address], [address] [place], [place] [address] Ph: [phone] Fax: [phone]  CC:  Tysinger, Kermit Balo, PA-C [address] [place],  [place] [address]  Tysinger, Kermit Balo, PA-C

## 2023-04-09 LAB — MULTIPLE MYELOMA PANEL, SERUM
Albumin SerPl Elph-Mcnc: 4 g/dL (ref 2.9–4.4)
Albumin/Glob SerPl: 1.2 (ref 0.7–1.7)
Alpha 1: 0.2 g/dL (ref 0.0–0.4)
Alpha2 Glob SerPl Elph-Mcnc: 0.7 g/dL (ref 0.4–1.0)
B-Globulin SerPl Elph-Mcnc: 1 g/dL (ref 0.7–1.3)
Gamma Glob SerPl Elph-Mcnc: 1.7 g/dL (ref 0.4–1.8)
Globulin, Total: 3.6 g/dL (ref 2.2–3.9)
IgA/Immunoglobulin A, Serum: 552 mg/dL — ABNORMAL HIGH (ref 64–422)
IgG (Immunoglobin G), Serum: 1734 mg/dL — ABNORMAL HIGH (ref 586–1602)
IgM (Immunoglobulin M), Srm: 117 mg/dL (ref 26–217)
Total Protein: 7.6 g/dL (ref 6.0–8.5)

## 2023-04-09 LAB — ANA W/REFLEX IF POSITIVE
Anti JO-1: <0.2 AI (ref 0.0–0.9)
Anti Nuclear Antibody (ANA): POSITIVE — AB
Centromere Ab Screen: <0.2 AI (ref 0.0–0.9)
Chromatin Ab SerPl-aCnc: <0.2 AI (ref 0.0–0.9)
ENA RNP Ab: 1.2 AI — ABNORMAL HIGH (ref 0.0–0.9)
ENA SM Ab Ser-aCnc: <0.2 AI (ref 0.0–0.9)
ENA SSA (RO) Ab: <0.2 AI (ref 0.0–0.9)
ENA SSB (LA) Ab: <0.2 AI (ref 0.0–0.9)
Scleroderma (Scl-70) (ENA) Antibody, IgG: <0.2 AI (ref 0.0–0.9)
dsDNA Ab: <1 IU/mL (ref 0–9)

## 2023-04-09 LAB — THYROID PANEL WITH TSH
Free Thyroxine Index: 2.7 (ref 1.2–4.9)
T3 Uptake Ratio: 30 % (ref 24–39)
T4, Total: 9 ug/dL (ref 4.5–12.0)
TSH: 1.02 u[IU]/mL (ref 0.450–4.500)

## 2023-04-09 LAB — RPR: RPR Ser Ql: NONREACTIVE

## 2023-04-09 LAB — ACETYLCHOLINE RECEPTOR, BINDING: AChR Binding Ab, Serum: <0.03 nmol/L (ref 0.00–0.24)

## 2023-04-09 LAB — C-REACTIVE PROTEIN: CRP: 4 mg/L (ref 0–10)

## 2023-04-09 LAB — SEDIMENTATION RATE: Sed Rate: 15 mm/hr (ref 0–40)

## 2023-04-09 LAB — CK: Total CK: 46 U/L (ref 32–182)

## 2023-04-09 LAB — VITAMIN B12: Vitamin B-12: 444 pg/mL (ref 232–1245)

## 2023-04-12 ENCOUNTER — Telehealth: Payer: Self-pay | Admitting: Neurology

## 2023-04-12 NOTE — Telephone Encounter (Signed)
[name] is requesting order for MRI be refax.

## 2023-04-12 NOTE — Telephone Encounter (Signed)
[name]: [id number] exp. 04/06/23-05/06/23 sent to [facility] for open MRI [phone]

## 2023-04-12 NOTE — Telephone Encounter (Signed)
I sent the fax again.

## 2023-05-06 ENCOUNTER — Ambulatory Visit: Payer: Medicare PPO | Admitting: Medical

## 2023-05-10 ENCOUNTER — Encounter: Payer: Self-pay | Admitting: Medical

## 2023-05-10 ENCOUNTER — Ambulatory Visit (INDEPENDENT_AMBULATORY_CARE_PROVIDER_SITE_OTHER): Payer: Medicare PPO | Admitting: Medical

## 2023-05-10 VITALS — BP 120/70 | HR 72 | Wt 177.6 lb

## 2023-05-10 DIAGNOSIS — R911 Solitary pulmonary nodule: Secondary | ICD-10-CM

## 2023-05-10 DIAGNOSIS — K760 Fatty (change of) liver, not elsewhere classified: Secondary | ICD-10-CM

## 2023-05-10 DIAGNOSIS — I2510 Atherosclerotic heart disease of native coronary artery without angina pectoris: Secondary | ICD-10-CM | POA: Diagnosis not present

## 2023-05-10 DIAGNOSIS — E559 Vitamin D deficiency, unspecified: Secondary | ICD-10-CM

## 2023-05-10 DIAGNOSIS — E785 Hyperlipidemia, unspecified: Secondary | ICD-10-CM | POA: Insufficient documentation

## 2023-05-10 DIAGNOSIS — R7301 Impaired fasting glucose: Secondary | ICD-10-CM

## 2023-05-10 DIAGNOSIS — R9389 Abnormal findings on diagnostic imaging of other specified body structures: Secondary | ICD-10-CM

## 2023-05-10 DIAGNOSIS — I10 Essential (primary) hypertension: Secondary | ICD-10-CM

## 2023-05-10 NOTE — Progress Notes (Signed)
Subjective:  [name] is a 72 y.o. female who presents for Chief Complaint  Patient presents with   3 month follow-up    3 month follow-up on fasting cholesterol     Here for 41mo f/u .  Last visit we initiated Repatha.  She didn't want to use statins as her former husband (widowed) and sister both had lots of problems with statins including myopathy.   She is tolerating Repatha well, x 3 months.   Here for fasting labs  Vit d deficiency - compliant with 2k IU daily without c/o.  Does get sun exposure regularly and eats fish regularly  Impaired fasting glucose - trying to be careful with diet  Hypertension - compliant with medications, no chest pains palpitations or edema  No other aggravating or relieving factors.    No other c/o.  Past Medical History:  Diagnosis Date   Allergic rhinitis, cause unspecified    Arthritis    Chewing tobacco use    Coronary artery disease, non-occlusive December 2006   Dyslipidemia    Essential hypertension    previously uncontrolled, now well-controlled; no evidence of renal artery stenosis   GERD (gastroesophageal reflux disease)    H/O echocardiogram 12/2014   60-65 % EF, mild basal hypertrophy focally at septum, otherwise normal   Heart murmur 12/2014   mild focal hypertrophy of septum, EF 60-65%, trivial aortic regurgitation   History of cardiac catheterization December 2006   for exertional chest pain: Nonobstructive CAD   History of migraine headaches    not an issues as of 07/2017   Onychomycosis 05/2015   Pneumococcal vaccine refused 05/2015   Current Outpatient Medications on File Prior to Visit  Medication Sig Dispense Refill   amLODipine (NORVASC) 5 MG tablet TAKE 1 TABLET EVERY DAY 90 tablet 1   Cholecalciferol (VITAMIN D) 50 MCG (2000 UT) CAPS Take 1 capsule (2,000 Units total) by mouth daily. 90 capsule 0   diazepam (VALIUM) 5 MG tablet Take 1-2 tablets 30 minutes prior to MRI, may repeat once as needed. Must have driver. 3  tablet 0   DULoxetine (CYMBALTA) 20 MG capsule Take 1 capsule (20 mg total) by mouth daily. 30 capsule 3   Evolocumab (REPATHA) 140 MG/ML SOSY Inject 140 mg into the skin every 14 (fourteen) days. 2.1 mL 5   hydrochlorothiazide (HYDRODIURIL) 25 MG tablet Take 1 tablet (25 mg total) by mouth daily. 90 tablet 2   valsartan (DIOVAN) 80 MG tablet Take 1 tablet (80 mg total) by mouth daily. Pt needs to schedule appt with provider for further refills - 1st attempt 30 tablet 0   No current facility-administered medications on file prior to visit.    The following portions of the patient's history were reviewed and updated as appropriate: allergies, current medications, past family history, past medical history, past social history, past surgical history and problem list.  ROS Otherwise as in subjective above  Objective: BP 120/70   Pulse 72   Wt 177 lb 9.6 oz (80.6 kg)   LMP  (LMP Unknown)   BMI 32.48 kg/m   General appearance: alert, no distress, well developed, well nourished Neck: supple, no lymphadenopathy, no thyromegaly, no masses, no bruits Heart: RRR, normal S1, S2, no murmurs Lungs: CTA bilaterally, no wheezes, rhonchi, or rales Abdomen: +bs, soft, non tender, non distended, no masses, no hepatomegaly, no splenomegaly Pulses: 2+ radial pulses, 1+ pedal pulses, normal cap refill Ext: no edema   Assessment: Encounter Diagnoses  Name Primary?  Essential hypertension, benign Yes   Coronary artery disease involving native coronary artery of native heart without angina pectoris    Pulmonary nodule    Impaired fasting blood sugar    Fatty liver    Vitamin D deficiency    Hyperlipidemia, unspecified hyperlipidemia type    Abnormal CT of the chest      Plan: Hypertension-continue amlodipine 5 mg daily, hydrochlorothiazide 25 mg daily, valsartan 80 mg daily.  CAD on prior scan in 2021 She declines statins for years due to problems with statin tolerance with both her former  husband and her sister including myopathy She was finally agreeable to Repatha earlier this year and has been taking Repatha for the past 3 months Updated labs today Referral back to cardiology  Pulmonary nodules on prior chest CT in 2021.  She is a non-smoker.  She declines repeat chest CT at this time.  At that time the findings were felt to be low risk for lung cancer  Impaired glucose-continue efforts with healthy diet and exercise  Fatty liver-continue efforts with healthy diet and exercise  Vitamin D deficiency-compliant with 2000 units daily.  Recheck labs today.  She does not like to take pills in general so we may end up doing a weekly vitamin D instead of daily  Hyperlipidemia-compliant with Repatha, get labs today  Abnormal chest CT coronary test back in 2021-given the findings we will refer back to cardiology for further evaluation possible echocardiogram.  She declines repeat chest CT in reference to pulmonary nodules today   [name] was seen today for 3 month follow-up.  Diagnoses and all orders for this visit:  Essential hypertension, benign -     NMR, lipoprofile -     Ambulatory referral to Cardiology  Coronary artery disease involving native coronary artery of native heart without angina pectoris -     NMR, lipoprofile -     Ambulatory referral to Cardiology  Pulmonary nodule  Impaired fasting blood sugar  Fatty liver  Vitamin D deficiency -     VITAMIN D 25 Hydroxy (Vit-D Deficiency, Fractures)  Hyperlipidemia, unspecified hyperlipidemia type -     ALT -     Ambulatory referral to Cardiology  Abnormal CT of the chest    Follow up: Pending labs

## 2023-05-11 LAB — VITAMIN D 25 HYDROXY (VIT D DEFICIENCY, FRACTURES): Vit D, 25-Hydroxy: 25.7 ng/mL — ABNORMAL LOW (ref 30.0–100.0)

## 2023-05-11 LAB — NMR, LIPOPROFILE
Cholesterol, Total: 119 mg/dL (ref 100–199)
HDL Particle Number: 37.3 umol/L (ref >=30.5)
HDL-C: 60 mg/dL (ref >39)
LDL Particle Number: 359 nmol/L (ref <1000)
LDL Size: 20.8 nm (ref >20.5)
LDL-C (NIH Calc): 42 mg/dL (ref 0–99)
LP-IR Score: 43 (ref <=45)
Small LDL Particle Number: 135 nmol/L (ref <=527)
Triglycerides: 92 mg/dL (ref 0–149)

## 2023-05-11 LAB — ALT: ALT: 15 IU/L (ref 0–32)

## 2023-05-12 ENCOUNTER — Other Ambulatory Visit: Payer: Self-pay | Admitting: Medical

## 2023-05-12 ENCOUNTER — Other Ambulatory Visit (HOSPITAL_COMMUNITY): Payer: Self-pay

## 2023-05-12 MED ORDER — VITAMIN D (ERGOCALCIFEROL) 1.25 MG (50000 UNIT) PO CAPS
50000.0000 [IU] | ORAL_CAPSULE | ORAL | 3 refills | Status: DC
Start: 2023-05-12 — End: 2024-05-31

## 2023-05-12 NOTE — Progress Notes (Signed)
Results sent through MyChart

## 2023-06-14 ENCOUNTER — Encounter: Payer: Self-pay | Admitting: Neurology

## 2023-06-21 ENCOUNTER — Ambulatory Visit: Payer: Medicare PPO | Admitting: Student

## 2023-07-08 ENCOUNTER — Other Ambulatory Visit (HOSPITAL_COMMUNITY): Payer: Self-pay

## 2023-07-08 ENCOUNTER — Other Ambulatory Visit: Payer: Self-pay | Admitting: Medical

## 2023-07-08 ENCOUNTER — Other Ambulatory Visit: Payer: Self-pay

## 2023-07-08 MED ORDER — REPATHA 140 MG/ML ~~LOC~~ SOSY
140.0000 mg | PREFILLED_SYRINGE | SUBCUTANEOUS | 2 refills | Status: DC
Start: 2023-07-08 — End: 2023-10-13
  Filled 2023-07-08: qty 2, 28d supply, fill #0
  Filled 2023-08-10: qty 2, 28d supply, fill #1
  Filled 2023-09-15: qty 2, 28d supply, fill #2

## 2023-07-09 ENCOUNTER — Encounter: Payer: Medicare PPO | Admitting: Neurology

## 2023-07-16 ENCOUNTER — Encounter: Payer: Medicare PPO | Admitting: Neurology

## 2023-08-05 ENCOUNTER — Ambulatory Visit: Payer: Medicare PPO | Admitting: Student

## 2023-08-10 ENCOUNTER — Telehealth: Payer: Self-pay | Admitting: Medical

## 2023-08-10 ENCOUNTER — Other Ambulatory Visit (HOSPITAL_COMMUNITY): Payer: Self-pay

## 2023-08-10 NOTE — Telephone Encounter (Signed)
Pt called and would like a handicap placard. She states that she is having trouble walking. She uses a cane in the house and has to use a walker when she goes out. She has had to cancel a lot of appointments due to trouble getting around. . Please advise if this is something you will fill out for her

## 2023-08-10 NOTE — Telephone Encounter (Signed)
Pt asks if you can call her, I asked if it was something I can help with but she rather talk to you.

## 2023-08-10 NOTE — Telephone Encounter (Signed)
Pt coming in for a visit on Friday afternoon

## 2023-08-13 ENCOUNTER — Ambulatory Visit (INDEPENDENT_AMBULATORY_CARE_PROVIDER_SITE_OTHER): Payer: Medicare PPO | Admitting: Medical

## 2023-08-13 VITALS — BP 138/70 | HR 86

## 2023-08-13 DIAGNOSIS — R0789 Other chest pain: Secondary | ICD-10-CM

## 2023-08-13 DIAGNOSIS — R29898 Other symptoms and signs involving the musculoskeletal system: Secondary | ICD-10-CM | POA: Diagnosis not present

## 2023-08-13 DIAGNOSIS — R269 Unspecified abnormalities of gait and mobility: Secondary | ICD-10-CM | POA: Diagnosis not present

## 2023-08-13 DIAGNOSIS — R1011 Right upper quadrant pain: Secondary | ICD-10-CM

## 2023-08-13 NOTE — Progress Notes (Signed)
Subjective:  [name] is a 72 y.o. female who presents for Chief Complaint  Patient presents with   Consult    Needs handicap placard as having trouble walking, has a nerve conduction study next month. Has[place]'t been [place] to neurology [place]     Here for handicap placard.  She is having trouble walking for a few years now.  She has been seeing neurology.  A few months ago she had a brain MRI but she has not heard back from this.  She has a nerve conduction study coming up soon.  She had labs done a few months ago with neurology.  However she is having hard time walking and would like a handicap placard to make this easier for her.  She uses a cane regularly.  She has ongoing leg weakness.  She denies saddle anesthesia.  No incontinence.  She does not have a lot of pain in the legs.  Occasionally might get some pain but not of frequent issue.  Denies specifically chronic back or hip pain.  She has recently been getting some pain in her chest wall/ribs more laterally on both sides but worse on the right.  She also gets some right upper belly pain.  She was curious if her gallbladder was causing this.  Occasionally she gets pain after eating and nausea but not every time she eats and not go frequently.  She does not eat a lot of fatty and greasy foods.  She denies fall or injury to the chest wall.  She denies shortness of breath or wheezing.  No fevers, no recent unexplained weight loss.  No other aggravating or relieving factors.    No other c/o.  Past Medical History:  Diagnosis Date   Allergic rhinitis, cause unspecified    Arthritis    Chewing tobacco use    Coronary artery disease, non-occlusive December 2006   Dyslipidemia    Essential hypertension    previously uncontrolled, now well-controlled; no evidence of renal artery stenosis   GERD (gastroesophageal reflux disease)    H/O echocardiogram 12/2014   60-65 % EF, mild basal hypertrophy focally at  septum, otherwise normal   Heart murmur 12/2014   mild focal hypertrophy of septum, EF 60-65%, trivial aortic regurgitation   History of cardiac catheterization December 2006   for exertional chest pain: Nonobstructive CAD   History of migraine headaches    not an issues as of 07/2017   Onychomycosis 05/2015   Pneumococcal vaccine refused 05/2015   Current Outpatient Medications on File Prior to Visit  Medication Sig Dispense Refill   amLODipine (NORVASC) 5 MG tablet TAKE 1 TABLET EVERY DAY 90 tablet 1   DULoxetine (CYMBALTA) 20 MG capsule Take 1 capsule (20 mg total) by mouth daily. 30 capsule 3   Evolocumab (REPATHA) 140 MG/ML SOSY Inject 140 mg into the skin every 14 (fourteen) days. 2 mL 2   hydrochlorothiazide (HYDRODIURIL) 25 MG tablet Take 1 tablet (25 mg total) by mouth daily. 90 tablet 2   valsartan (DIOVAN) 80 MG tablet Take 1 tablet (80 mg total) by mouth daily. Pt needs to schedule appt with provider for further refills - 1st attempt 30 tablet 0   Vitamin D, Ergocalciferol, (DRISDOL) 1.25 MG (50000 UNIT) CAPS capsule Take 1 capsule (50,000 Units total) by mouth every 7 (seven) days. 12 capsule 3   diazepam (VALIUM) 5 MG tablet Take 1-2 tablets 30 minutes prior to MRI, may repeat once as  needed. Must have driver. (Patient not taking: Reported on 08/13/2023) 3 tablet 0   No current facility-administered medications on file prior to visit.    The following portions of the patient's history were reviewed and updated as appropriate: allergies, current medications, past family history, past medical history, past social history, past surgical history and problem list.  ROS Otherwise as in subjective above  Objective: BP 138/70   Pulse 86   LMP  (LMP Unknown)   General appearance: alert, no distress, well developed, well nourished Heart: RRR, normal S1, S2, no murmurs Lungs: CTA bilaterally, no wheezes, rhonchi, or rales, normal I/E Abdomen: +bs, soft, mild right upper quadrant  tenderness, otherwise non tender, non distended, no masses, no hepatomegaly, no splenomegaly Somewhat tender over the right lower lateral chest wall otherwise chest nontender Pulses: 2+ radial pulses, 2+ pedal pulses, normal cap refill Ext: no edema   Assessment: Encounter Diagnoses  Name Primary?   Chest wall pain Yes   RUQ abdominal pain    Weakness of both lower extremities    Gait abnormality      Plan: Chest wall pain-labs and chest x-ray as below, etiology unclear  Intermittent occasional right upper quadrant abdominal pain-discussed possible differential.  She has not had real strong symptoms for gallbladder issue but cannot rule this out.  Labs and chest x-ray as below.  Leg weakness, gait abnormality-continue follow-up with neurology for upcoming nerve conduction test.  Completed handicap placard form for her today.  She is using a cane right now regularly.  I reviewed back over her April 2024 neurology consult notes.  At that time they were pursuing MRI brain, nerve conduction study and labs  [name] was seen today for consult.  Diagnoses and all orders for this visit:  Chest wall pain -     DG Ribs Unilateral W/Chest Right -     Comprehensive metabolic panel -     CBC with Differential/Platelet -     Lipase  RUQ abdominal pain -     DG Ribs Unilateral W/Chest Right -     Comprehensive metabolic panel -     CBC with Differential/Platelet -     Lipase  Weakness of both lower extremities  Gait abnormality    Follow up: pending labs, xray

## 2023-08-14 LAB — CBC WITH DIFFERENTIAL/PLATELET
Basophils Absolute: 0 10*3/uL (ref 0.0–0.2)
Basos: 1 %
EOS (ABSOLUTE): 0.1 10*3/uL (ref 0.0–0.4)
Eos: 2 %
Hematocrit: 41.7 % (ref 34.0–46.6)
Hemoglobin: 14.4 g/dL (ref 11.1–15.9)
Immature Grans (Abs): 0 10*3/uL (ref 0.0–0.1)
Immature Granulocytes: 1 %
Lymphocytes Absolute: 1.2 10*3/uL (ref 0.7–3.1)
Lymphs: 20 %
MCH: 33.6 pg — ABNORMAL HIGH (ref 26.6–33.0)
MCHC: 34.5 g/dL (ref 31.5–35.7)
MCV: 97 fL (ref 79–97)
Monocytes Absolute: 0.5 10*3/uL (ref 0.1–0.9)
Monocytes: 8 %
Neutrophils Absolute: 4.4 10*3/uL (ref 1.4–7.0)
Neutrophils: 68 %
Platelets: 175 10*3/uL (ref 150–450)
RBC: 4.28 x10E6/uL (ref 3.77–5.28)
RDW: 12.3 % (ref 11.7–15.4)
WBC: 6.3 10*3/uL (ref 3.4–10.8)

## 2023-08-14 LAB — COMPREHENSIVE METABOLIC PANEL
ALT: 18 IU/L (ref 0–32)
AST: 24 IU/L (ref 0–40)
Albumin: 4.1 g/dL (ref 3.8–4.8)
Alkaline Phosphatase: 74 IU/L (ref 44–121)
BUN/Creatinine Ratio: 12 (ref 12–28)
BUN: 7 mg/dL — ABNORMAL LOW (ref 8–27)
Bilirubin Total: 0.7 mg/dL (ref 0.0–1.2)
CO2: 24 mmol/L (ref 20–29)
Calcium: 9.1 mg/dL (ref 8.7–10.3)
Chloride: 100 mmol/L (ref 96–106)
Creatinine, Ser: 0.6 mg/dL (ref 0.57–1.00)
Globulin, Total: 3.2 g/dL (ref 1.5–4.5)
Glucose: 157 mg/dL — ABNORMAL HIGH (ref 70–99)
Potassium: 4.1 mmol/L (ref 3.5–5.2)
Sodium: 138 mmol/L (ref 134–144)
Total Protein: 7.3 g/dL (ref 6.0–8.5)
eGFR: 95 mL/min/{1.73_m2} (ref >59)

## 2023-08-14 LAB — LIPASE: Lipase: 17 U/L (ref 14–85)

## 2023-08-16 NOTE — Progress Notes (Signed)
Results sent through MyChart

## 2023-08-17 ENCOUNTER — Telehealth: Payer: Self-pay

## 2023-08-17 NOTE — Telephone Encounter (Signed)
At 3:39 pt left a message asking for a call back from [place], [place]

## 2023-08-17 NOTE — Telephone Encounter (Signed)
I called the patient to find out where she had her MRI done. I left a voicemail. If the patient calls back, please inquire where MRI was taken at. I will send to Tor[place] to check.   The patient cancelled her nerve conduction study, stating she will be out of town. She will call back to reschedule.

## 2023-08-17 NOTE — Telephone Encounter (Signed)
-----   Message from Levert Feinstein sent at 08/16/2023  5:12 PM EDT ----- Her emg with [place] is cancelled, if not by patient, make sure she will be on schedule again, I did not find her MRI brain result either, not sure if she had it? ----- Message ----- From: Jac Canavan, PA-C Sent: 08/13/2023  11:05 AM EDT To: Levert Feinstein, MD  Hello Dr. Terrace Arabia, [facility] you are doing well  I saw our mutual patient today.  She has a nerve conduction study coming up with you.  I was looking back in her records today.  She said she had an MRI brain done back in May but has not heard back on results.  I wanted to let you know that she has not heard back about those results and they are not in epic so I cannot see them.  She will follow-up with you as planned  I did get a handicap placard form for today and I will evaluate another concern she had today

## 2023-08-21 ENCOUNTER — Other Ambulatory Visit (HOSPITAL_COMMUNITY): Payer: Self-pay

## 2023-08-31 ENCOUNTER — Telehealth: Payer: Self-pay | Admitting: Medical

## 2023-08-31 NOTE — Telephone Encounter (Signed)
Pt called & states she has a stye for 4 days, using OTC stye gel & hasn't went away,  had crusting first 2 days,  eyeball is red, has small white bump on eye lid,  very light sensitive, would like you to call something in for her.  I advised needed an appt, but there is none available today with any provider.

## 2023-09-02 NOTE — Telephone Encounter (Signed)
Spoke to patient , she states eye is better and she will call if more issues

## 2023-09-08 ENCOUNTER — Ambulatory Visit: Payer: Medicare PPO | Attending: Student | Admitting: Physician Assistant

## 2023-09-08 ENCOUNTER — Encounter: Payer: Self-pay | Admitting: Physician Assistant

## 2023-09-08 VITALS — BP 154/76 | HR 75 | Ht 62.0 in | Wt 176.4 lb

## 2023-09-08 DIAGNOSIS — I358 Other nonrheumatic aortic valve disorders: Secondary | ICD-10-CM | POA: Diagnosis not present

## 2023-09-08 DIAGNOSIS — R0789 Other chest pain: Secondary | ICD-10-CM

## 2023-09-08 DIAGNOSIS — I2510 Atherosclerotic heart disease of native coronary artery without angina pectoris: Secondary | ICD-10-CM | POA: Diagnosis not present

## 2023-09-08 DIAGNOSIS — I2584 Coronary atherosclerosis due to calcified coronary lesion: Secondary | ICD-10-CM | POA: Diagnosis not present

## 2023-09-08 DIAGNOSIS — Z0000 Encounter for general adult medical examination without abnormal findings: Secondary | ICD-10-CM | POA: Diagnosis not present

## 2023-09-08 DIAGNOSIS — R0781 Pleurodynia: Secondary | ICD-10-CM | POA: Diagnosis not present

## 2023-09-08 DIAGNOSIS — M1711 Unilateral primary osteoarthritis, right knee: Secondary | ICD-10-CM

## 2023-09-08 NOTE — Patient Instructions (Addendum)
Medication Instructions:  NO CHANGES *If you need a refill on your cardiac medications before your next appointment, please call your pharmacy*   Lab Work: NO LABS If you have labs (blood work) drawn today and your tests are completely normal, you will receive your results only by: MyChart Message (if you have MyChart) OR A paper copy in the mail If you have any lab test that is abnormal or we need to change your treatment, we will call you to review the results.   Testing/Procedures:[address] Your physician has requested that you have an echocardiogram. Echocardiography is a painless test that uses sound waves to create images of your heart. It provides your doctor with information about the size and shape of your heart and how well your heart's chambers and valves are working. This procedure takes approximately one hour. There are no restrictions for this procedure. Please do NOT wear cologne, perfume, aftershave, or lotions (deodorant is allowed). Please arrive 15 minutes prior to your appointment time.    Follow-Up: At [facility], you and your health needs are our priority.  As part of our continuing mission to provide you with exceptional heart care, we have created designated Provider Care Teams.  These Care Teams include your primary Cardiologist (physician) and Advanced Practice Providers (APPs -  Physician Assistants and Nurse Practitioners) who all work together to provide you with the care you need, when you need it.   Your next appointment:   3 month(s)  Provider:   Azalee Course, PA  Other Instructions CONTINUE OBSERVATION FOR RIB PAIN

## 2023-09-08 NOTE — Progress Notes (Addendum)
Cardiology Office Note:  .   Date:  09/10/2023  ID:  [name], DOB Mar 08, 1951, MRN [id number] PCP: Genia Del   HeartCare Providers Cardiologist:  Bryan Lemma, MD     History of Present Illness: .   [name] is a 72 y.o. female with PMH of hypertension and leg swelling.  Cardiac catheterization performed on 12/02/2005 by Dr. Jacinto Halim showed clean coronary arteries.  Echocardiogram obtained in January 2016 showed EF 60 to 65%, mild focal basal hypertrophy, normal wall motion, trivial AI.  Myoview obtained on the same day showed EF 63%, no significant reversible ischemia, small mild mostly fixed distal anteroapical breast attenuation artifact.  CT cardiac scoring test obtained on 10/02/2020 showed calcium score of 564 which placed the patient in 99th percentile for age and sex matched control.  Tiny pulmonary nodule measuring 2 to 3 mm in size, nonspecific but likely benign.  Dilated pulmonary trunk concerning for pulmonary arterial hypertension, hepatic steatosis.  ABI obtained in March 2022 was normal.  Venous Doppler obtained in September 2022 was negative for DVT.  Patient was last seen by Dr. [name] in September 2021 at which time her blood pressure remained poorly controlled.  More recently, patient was seen by her PCP in August 2020 for with chest wall pain.  She presents today for follow-up.  She is not very active.  She still has not had the x-ray ordered by her PCP.  She says her bilateral rib pain has been going on for the past month.  Symptom is more noticeable when she tried to get up from a lying position.  However she does not have any rib pain when she walks around.  It seems to be brought on by body position change.  I suspect her symptoms is more musculoskeletal in nature.  EKG is negative.  I recommend continue observation.  Blood pressure is elevated today, however was normal when she saw her PCP.  Even manual repeat blood pressure by myself was 154/76.   We will reassess in 65-month.  She does have a aortic valve murmur, I will obtain an echocardiogram.  She is aware to contact cardiology service if her rib pain worsens.  ROS:   He denies palpitations, dyspnea, pnd, orthopnea, n, v, dizziness, syncope, edema, weight gain, or early satiety. All other systems reviewed and are otherwise negative except as noted above.    Studies Reviewed: Marland Kitchen   EKG Interpretation Date/Time:  Wednesday September 08 2023 13:30:18 EDT Ventricular Rate:  75 PR Interval:  152 QRS Duration:  74 QT Interval:  374 QTC Calculation: 417 R Axis:   22  Text Interpretation: Normal sinus rhythm No significant ST-T wave changes Confirmed by Azalee Course [phone]) on 09/08/2023 1:39:18 PM    Cardiac Studies & Procedures          CT SCANS  CT CARDIAC SCORING (SELF PAY ONLY) 10/02/2020  Addendum 10/02/2020  5:31 PM ADDENDUM REPORT: 10/02/2020 17:29  CLINICAL DATA:  Risk stratification  EXAM: Coronary Calcium Score  TECHNIQUE: The patient was scanned on a [employer] scanner. Axial non-contrast 3 mm slices were carried out through the heart. The data set was analyzed on a dedicated work station and scored using the Agatson method.  FINDINGS: Non-cardiac: See separate report from [facility].  Ascending Aorta: Normal  Pericardium: Normal  Coronary arteries: Normal Origin.  IMPRESSION: Coronary calcium score of 564. This was 68 percentile for age and sex matched control.  Barbara Tobb,DO  Electronically Signed By: Thomasene Ripple DO On: 10/02/2020 17:29  Narrative EXAM: OVER-READ INTERPRETATION  CT CHEST  The following report is an over-read performed by radiologist Dr. Trudie Reed of [facility] on 10/02/2020. This over-read does not include interpretation of cardiac or coronary anatomy or pathology. The coronary calcium score interpretation by the cardiologist is attached.  COMPARISON:  None.  FINDINGS: Dilatation of  the pulmonic trunk (3.8 cm in diameter). Aortic atherosclerosis. Several tiny 2-3 mm pulmonary nodules scattered throughout the lungs. Within the visualized portions of the thorax there are no other larger more suspicious appearing pulmonary nodules or masses, there is no acute consolidative airspace disease, no pleural effusions, no pneumothorax and no lymphadenopathy. Visualized portions of the upper abdomen demonstrates diffuse low attenuation throughout the visualized hepatic parenchyma. There are no aggressive appearing lytic or blastic lesions noted in the visualized portions of the skeleton.  IMPRESSION: 1. Tiny pulmonary nodules measuring 2-3 mm in size, nonspecific, but statistically likely benign. No follow-up needed if patient is low-risk (and has no known or suspected primary neoplasm). Non-contrast chest CT can be considered in 12 months if patient is high-risk. This recommendation follows the consensus statement: Guidelines for Management of Incidental Pulmonary Nodules Detected on CT Images: From the Fleischner Society 2017; Radiology 2017; 284:228-243. 2. Dilatation of the pulmonic trunk (3.8 cm in diameter), concerning for pulmonary arterial hypertension. 3. Hepatic steatosis.  Electronically Signed: By: Trudie Reed M.D. On: 10/02/2020 11:01          Risk Assessment/Calculations:            Physical Exam:   VS:  BP (!) 154/76   Pulse 75   Ht 5\' 2"  (1.575 m)   Wt 176 lb 6.4 oz (80 kg)   LMP  (LMP Unknown)   SpO2 95%   BMI 32.26 kg/m    Wt Readings from Last 3 Encounters:  09/08/23 176 lb 6.4 oz (80 kg)  05/10/23 177 lb 9.6 oz (80.6 kg)  04/06/23 176 lb 8 oz (80.1 kg)    GEN: Well nourished, well developed in no acute distress NECK: No JVD; No carotid bruits CARDIAC: RRR, no murmurs, rubs, gallops RESPIRATORY:  Clear to auscultation without rales, wheezing or rhonchi  ABDOMEN: Soft, non-tender, non-distended EXTREMITIES:  No edema; No deformity    ASSESSMENT AND PLAN: .    Bilateral rib pain: Symptoms worsens with bending of the body especially when she get up from a lying position.  It does not occur with ambulation or physical exertion.  Will continue to monitor at this time.  Reassess in 3 months.  Earlier if echocardiogram is abnormal  Coronary artery calcification: Previous coronary calcium score placed the patient at 99th percentile for age and sex matched control.  Aortic murmur: Will obtain echocardiogram.       Dispo: Follow-up in 3 months  Signed, Azalee Course, [place]

## 2023-09-10 ENCOUNTER — Encounter: Payer: Self-pay | Admitting: Physician Assistant

## 2023-09-10 NOTE — Addendum Note (Signed)
Addended byLisabeth Devoid, Ashir Kunz on: 09/10/2023 11:25 PM   Modules accepted: Level of Service

## 2023-09-13 ENCOUNTER — Telehealth: Payer: Self-pay | Admitting: Cardiology

## 2023-09-13 NOTE — Telephone Encounter (Signed)
Pt returning a call to [place] regarding out of pocket cost for up coming Echo. Please advise

## 2023-09-13 NOTE — Telephone Encounter (Signed)
Returned call to patient and provided estimate amount.

## 2023-09-15 ENCOUNTER — Other Ambulatory Visit (HOSPITAL_COMMUNITY): Payer: Self-pay

## 2023-09-22 ENCOUNTER — Encounter: Payer: Medicare PPO | Admitting: Neurology

## 2023-09-30 ENCOUNTER — Other Ambulatory Visit: Payer: Self-pay | Admitting: Neurology

## 2023-10-08 ENCOUNTER — Ambulatory Visit (HOSPITAL_COMMUNITY): Payer: Medicare PPO

## 2023-10-12 ENCOUNTER — Telehealth: Payer: Self-pay | Admitting: Medical

## 2023-10-12 ENCOUNTER — Ambulatory Visit
Admission: RE | Admit: 2023-10-12 | Discharge: 2023-10-12 | Disposition: A | Discharge status: Home / Self Care | Payer: Medicare PPO | Source: Ambulatory Visit | Attending: Medical | Admitting: Medical

## 2023-10-12 DIAGNOSIS — R0781 Pleurodynia: Secondary | ICD-10-CM | POA: Diagnosis not present

## 2023-10-12 NOTE — Progress Notes (Unsigned)
Start time: End time:  Virtual Visit via Video Note  I connected with [name] on 10/12/23 by a video enabled telemedicine application and verified that I am speaking with the correct person using two identifiers.  Location: Patient: *** Provider: office   I discussed the limitations of evaluation and management by telemedicine and the availability of in person appointments. The patient expressed understanding and agreed to proceed.  History of Present Illness:  No chief complaint on file.  Cough and congestion x 2 weeks.   She had CXR done yesterday. She had seen Vincenza Hews with chest wall/rib pain in August, which had persisted when she saw cardiologist in September. They did not feel that her pain was cardiac. She is scheduled for an echocardiogram in November.    PMH, PSH, SH reviewed  HTN, HLD   ROS:     Observations/Objective:  LMP  (LMP Unknown)    Assessment and Plan:   Follow Up Instructions:    I discussed the assessment and treatment plan with the patient. The patient was provided an opportunity to ask questions and all were answered. The patient agreed with the plan and demonstrated an understanding of the instructions.   The patient was advised to call back or seek an in-person evaluation if the symptoms worsen or if the condition fails to improve as anticipated.  I spent *** minutes dedicated to the care of this patient, including pre-visit review of records, face to face time, post-visit ordering of testing and documentation.    Lavonda Jumbo, MD

## 2023-10-12 NOTE — Telephone Encounter (Signed)
Pt called c/o cough/cong/ chest   x 2 weeks   she went and had chest xray today from order previously by [facility].  Advised pt she needs appt and I scheduled with Dr. Lynelle Doctor 10/16  but pt still  Requesting call back

## 2023-10-13 ENCOUNTER — Other Ambulatory Visit: Payer: Self-pay | Admitting: Medical

## 2023-10-13 ENCOUNTER — Telehealth (INDEPENDENT_AMBULATORY_CARE_PROVIDER_SITE_OTHER): Payer: Medicare PPO | Admitting: Family Medicine

## 2023-10-13 ENCOUNTER — Other Ambulatory Visit (HOSPITAL_COMMUNITY): Payer: Self-pay

## 2023-10-13 ENCOUNTER — Encounter: Payer: Self-pay | Admitting: Family Medicine

## 2023-10-13 VITALS — Temp 97.4°F | Ht 62.0 in | Wt 175.0 lb

## 2023-10-13 DIAGNOSIS — R0981 Nasal congestion: Secondary | ICD-10-CM | POA: Diagnosis not present

## 2023-10-13 DIAGNOSIS — R052 Subacute cough: Secondary | ICD-10-CM

## 2023-10-13 MED ORDER — REPATHA 140 MG/ML ~~LOC~~ SOSY
140.0000 mg | PREFILLED_SYRINGE | SUBCUTANEOUS | 2 refills | Status: DC
  Filled 2023-10-13: qty 2, 28d supply, fill #0
  Filled 2023-11-11: qty 2, 28d supply, fill #1
  Filled 2023-12-06: qty 2, 28d supply, fill #2

## 2023-10-13 NOTE — Telephone Encounter (Signed)
I have called Radiology and asked them to go ahead and read this

## 2023-10-13 NOTE — Progress Notes (Signed)
X-ray shows no rib fractures, no obvious bony finding, there is some scarring in the lower left portion of the lungs but otherwise no collapsed lung or fluid  Does she have ongoing symptoms still?

## 2023-10-13 NOTE — Patient Instructions (Signed)
Start taking zyrtec once daily (if it makes you tired, switch to taking it at night). I would switch to Mucinex DM 12 hour tablets, take it twice daily. If you aren't coughing much during the day at all, you can just take it at night.  Contact us if you develop fever, discolored mucus or phlegm (either from the nose or what you cough up).--this could indicate an infection and need an antibiotic.  Use tylenol or ibuprofen, and heat to help with your sore ribs/back.

## 2023-10-14 ENCOUNTER — Other Ambulatory Visit: Payer: Self-pay

## 2023-10-14 ENCOUNTER — Other Ambulatory Visit (HOSPITAL_COMMUNITY): Payer: Self-pay

## 2023-10-29 ENCOUNTER — Ambulatory Visit (HOSPITAL_COMMUNITY): Payer: Medicare PPO

## 2023-11-02 ENCOUNTER — Telehealth: Payer: Self-pay | Admitting: Medical

## 2023-11-02 NOTE — Telephone Encounter (Signed)
Looking back over her chart, she is not currently taking cholesterol medication but should be using something to help lower future cholesterol buildup in her coronary arteries.   Remind me which medications has she tried, which has she NOT tolerated?  Is she willing to try something else.

## 2023-11-02 NOTE — Telephone Encounter (Signed)
Pt is taking repatha but getting this through patient assistance so it is not billed through her insurance

## 2023-11-05 ENCOUNTER — Other Ambulatory Visit: Payer: Self-pay | Admitting: Medical

## 2023-11-11 ENCOUNTER — Other Ambulatory Visit (HOSPITAL_COMMUNITY): Payer: Self-pay

## 2023-11-17 ENCOUNTER — Encounter: Payer: Medicare PPO | Admitting: Neurology

## 2023-12-06 ENCOUNTER — Other Ambulatory Visit (HOSPITAL_COMMUNITY): Payer: Self-pay

## 2023-12-09 ENCOUNTER — Telehealth: Payer: Self-pay

## 2023-12-09 ENCOUNTER — Ambulatory Visit (HOSPITAL_COMMUNITY): Payer: Medicare PPO

## 2023-12-09 ENCOUNTER — Ambulatory Visit (HOSPITAL_COMMUNITY): Payer: Medicare PPO | Attending: Internal Medicine

## 2023-12-09 DIAGNOSIS — I358 Other nonrheumatic aortic valve disorders: Secondary | ICD-10-CM

## 2023-12-09 LAB — ECHOCARDIOGRAM COMPLETE
Area-P 1/2: 3.56 cm2
S' Lateral: 2.9 cm

## 2023-12-09 NOTE — Progress Notes (Signed)
   12/09/2023  Patient ID: [name], female   DOB: 10/22/1951, 72 y.o.   MRN: [id number]  Patient's Repatha grant via [employer] was due for renewal. [name] has been renewed through 12/22/24, billing details below.    Sherrill Raring, PharmD Clinical Pharmacist [phone]

## 2023-12-12 ENCOUNTER — Other Ambulatory Visit: Payer: Self-pay | Admitting: Medical

## 2023-12-16 ENCOUNTER — Telehealth: Payer: Self-pay | Admitting: Physician Assistant

## 2023-12-16 NOTE — Telephone Encounter (Signed)
  Pt would like to ask Azalee Course if she can change her appt tomorrow to televisit. She said she is having a hard time finding transportation to take her to the appt

## 2023-12-16 NOTE — Telephone Encounter (Signed)
Spoke to patient stated she is having transportation problems.She wanted to change appointment with Azalee Course PA 12/20 at 3:10 pm to a virtual visit.Advised I will send message to Azalee Course PA for advice.

## 2023-12-17 ENCOUNTER — Ambulatory Visit: Payer: Medicare PPO | Attending: Physician Assistant | Admitting: Physician Assistant

## 2023-12-17 DIAGNOSIS — I10 Essential (primary) hypertension: Secondary | ICD-10-CM

## 2023-12-17 DIAGNOSIS — R079 Chest pain, unspecified: Secondary | ICD-10-CM

## 2023-12-17 NOTE — Telephone Encounter (Signed)
Patient wants a call back to confirm if she can have a virtual visit for her appointment today at 3:10 as she has transportation issue.

## 2023-12-17 NOTE — Progress Notes (Unsigned)
Virtual Visit via Telephone Note   Because of [name]'s co-morbid illnesses, she is at least at moderate risk for complications without adequate follow up.  This format is felt to be most appropriate for this patient at this time.  The patient did not have access to video technology/had technical difficulties with video requiring transitioning to audio format only (telephone).  All issues noted in this document were discussed and addressed.  No physical exam could be performed with this format.  Please refer to the patient's chart for her consent to telehealth for [facility].    Date:  12/18/2023   ID:  [name], DOB [birth year], MRN [id number] The patient was identified using 2 identifiers.  Patient Location: Home Provider Location: Office/Clinic   PCP:  Jac Canavan, PA-C   [facility] Providers Cardiologist:  Bryan Lemma, MD     Evaluation Performed:  Follow-Up Visit  Chief Complaint:  follow up  History of Present Illness:    [name] is a 72 y.o. female with PMH of hypertension and leg swelling.  Cardiac catheterization performed on 12/02/2005 by Dr. Jacinto Halim showed clean coronary arteries.  Echocardiogram obtained in January 2016 showed EF 60 to 65%, mild focal basal hypertrophy, normal wall motion, trivial AI.  Myoview obtained on the same day showed EF 63%, no significant reversible ischemia, small mild mostly fixed distal anteroapical breast attenuation artifact.  CT cardiac scoring test obtained on 10/02/2020 showed calcium score of 564 which placed the patient in 99th percentile for age and sex matched control.  Tiny pulmonary nodule measuring 2 to 3 mm in size, nonspecific but likely benign.  Dilated pulmonary trunk concerning for pulmonary arterial hypertension, hepatic steatosis.  ABI obtained in March 2022 was normal.  Venous Doppler obtained in September 2022 was negative for DVT.   I last saw the patient in September 2024,  prior to that, she was seen by her PCP in August 2024 for chest wall pain.  She described bilateral rib pain that been going on for the past month.  Symptom was no more noticeable when she tried to get up from a lying position however she does not have any rib pain when she walks around.  The symptom was brought on by body position changes.  I suspected her symptom was more musculoskeletal in nature.  EKG was negative.  I recommended routine observation.  Blood pressure was elevated.  I recommended reassessment.  She did have a aortic valve murmur, I recommended echocardiogram.  Echocardiogram was performed on 12/09/2023 which showed EF 60 to 65%, moderate LVH, no regional wall motion abnormality, dilated aorta measuring at 41 mm.  Patient presents today for virtual telephone visit.  She denies any further chest discomfort.  We reviewed the recent echocardiogram.  Her blood pressure today was 138/78.  Heart rate unknown.  She denies any exertional symptom.  Overall, she is doing well and can follow-up with Dr. [name] in 1 year.  I did ask her to monitor her blood pressure, if her systolic blood pressure keep peaking into the 140-150s range, we will need to adjust her blood pressure medication.  She says she is still getting valsartan in the mail, however it is not prescribed by her PCP Crosby Oyster or [place].  She is very certain she is taking the valsartan 80 mg daily even though the last prescription she received from [place] was for 30-day supply in January 2024.  She is also on HCTZ  and amlodipine at night.   Past Medical History:  Diagnosis Date   Allergic rhinitis, cause unspecified    Arthritis    Chewing tobacco use    Coronary artery disease, non-occlusive December 2006   Dyslipidemia    Essential hypertension    previously uncontrolled, now well-controlled; no evidence of renal artery stenosis   GERD (gastroesophageal reflux disease)    H/O echocardiogram 12/2014   60-65 % EF, mild basal  hypertrophy focally at septum, otherwise normal   Heart murmur 12/2014   mild focal hypertrophy of septum, EF 60-65%, trivial aortic regurgitation   History of cardiac catheterization December 2006   for exertional chest pain: Nonobstructive CAD   History of migraine headaches    not an issues as of 07/2017   Onychomycosis 05/2015   Pneumococcal vaccine refused 05/2015   Past Surgical History:  Procedure Laterality Date   ABDOMINAL HYSTERECTOMY     PARTIAL (benign, for bleeding), still has ovaries   CAROTID ANGIOGRAM  2005   COLONOSCOPY  2009   Dr. Loreta Ave   ESOPHAGOGASTRODUODENOSCOPY  2009   Dr. Loreta Ave   EYE SURGERY     new lenses 07/2017   LAPAROSCOPY  12/02   ovarian cyst   LEFT HEART CATH AND CORONARY ANGIOGRAPHY  12/06   Dr. Clarene Duke; no significant CAD. Normal LV function with EF of 60-65%. Also noted no RAS.   NM MYOVIEW LTD  12/2014   LOW RISK.  EF 63%.  Fixed Small size mild intensity distal anteroapical defect suggestive of breast attenuation.  No reversibility. = Breast attenuation.  NO INFARCT OR ISCHEMIA.   TRANSTHORACIC ECHOCARDIOGRAM  12/2014   EF 60 to 65%.  Normal wall motion and no valvular lesions.  Normal LV filling pressures.     No outpatient medications have been marked as taking for the 12/17/23 encounter (Video Visit) with Azalee Course, PA.     Allergies:   Covid-19 (mrna) vaccine, Morphine, and Morphine and codeine   Social History   Tobacco Use   Smoking status: Never   Smokeless tobacco: Current    Types: Snuff   Tobacco comments:    has been using since she was a teenager  Vaping Use   Vaping status: Never Used  Substance Use Topics   Alcohol use: Yes    Alcohol/week: 21.0 standard drinks of alcohol    Types: 21 Cans of beer per week    Comment: 12 drinks per week.   Drug use: No     Family Hx: The patient's family history includes Cancer in her maternal aunt; Diabetes in her maternal aunt, maternal grandmother, and maternal uncle; Heart disease in  her brother, brother, brother, maternal aunt, maternal grandmother, maternal uncle, mother, and paternal uncle; Heart disease (age of onset: 39) in her father; Hypertension in her mother.  ROS:   Please see the history of present illness.     All other systems reviewed and are negative.   Prior CV studies:   The following studies were reviewed today:  Cardiac Studies & Procedures      ECHOCARDIOGRAM  ECHOCARDIOGRAM COMPLETE 12/09/2023  Narrative ECHOCARDIOGRAM REPORT    Patient Name:   [name] Date of Exam: 12/09/2023 Medical Rec #:  [id number]       Height:       62.0 in Accession #:    [id number]      Weight:       175.0 lb Date of Birth:  01/27/1951  BSA:          1.806 m Patient Age:    71 years        BP:           154/76 mmHg Patient Gender: F               HR:           77 bpm. Exam Location:  [address]  Procedure: 2D Echo, Cardiac Doppler and Color Doppler  Indications:    I35.8 Aortic heart murmur  History:        Patient has prior history of Echocardiogram examinations, most recent 01/22/2015. Aortic heart murmur; Risk Factors:Obesity.  Sonographer:    Samule Ohm RDCS Referring Phys: [phone] Saladin Petrelli  IMPRESSIONS   1. Left ventricular ejection fraction, by estimation, is 60 to 65%. Left ventricular ejection fraction by PLAX is 61 %. The left ventricle has normal function. The left ventricle has no regional wall motion abnormalities. There is moderate left ventricular hypertrophy. Left ventricular diastolic parameters are consistent with Grade I diastolic dysfunction (impaired relaxation). 2. Right ventricular systolic function is normal. The right ventricular size is normal. Tricuspid regurgitation signal is inadequate for assessing PA pressure. 3. The mitral valve is normal in structure. No evidence of mitral valve regurgitation. 4. The aortic valve is tricuspid. Aortic valve regurgitation is trivial. Aortic valve sclerosis is present,  with no evidence of aortic valve stenosis. 5. Aortic dilatation noted. There is mild dilatation of the ascending aorta, measuring 41 mm. 6. The inferior vena cava is normal in size with greater than 50% respiratory variability, suggesting right atrial pressure of 3 mmHg.  FINDINGS Left Ventricle: Left ventricular ejection fraction, by estimation, is 60 to 65%. Left ventricular ejection fraction by PLAX is 61 %. The left ventricle has normal function. The left ventricle has no regional wall motion abnormalities. The left ventricular internal cavity size was normal in size. There is moderate left ventricular hypertrophy. Left ventricular diastolic parameters are consistent with Grade I diastolic dysfunction (impaired relaxation). Indeterminate filling pressures.  Right Ventricle: The right ventricular size is normal. No increase in right ventricular wall thickness. Right ventricular systolic function is normal. Tricuspid regurgitation signal is inadequate for assessing PA pressure.  Left Atrium: Left atrial size was normal in size.  Right Atrium: Right atrial size was normal in size.  Pericardium: There is no evidence of pericardial effusion.  Mitral Valve: The mitral valve is normal in structure. No evidence of mitral valve regurgitation.  Tricuspid Valve: The tricuspid valve is grossly normal. Tricuspid valve regurgitation is trivial.  Aortic Valve: The aortic valve is tricuspid. Aortic valve regurgitation is trivial. Aortic valve sclerosis is present, with no evidence of aortic valve stenosis.  Pulmonic Valve: The pulmonic valve was grossly normal. Pulmonic valve regurgitation is mild.  Aorta: Aortic dilatation noted. There is mild dilatation of the ascending aorta, measuring 41 mm.  Venous: The inferior vena cava is normal in size with greater than 50% respiratory variability, suggesting right atrial pressure of 3 mmHg.  IAS/Shunts: No atrial level shunt detected by color flow  Doppler.   LEFT VENTRICLE PLAX 2D LV EF:         Left            Diastology ventricular     LV e' medial:    9.14 cm/s ejection        LV E/e' medial:  9.3 fraction by     LV e' lateral:  10.70 cm/s PLAX is 61      LV E/e' lateral: 8.0 %. LVIDd:         4.30 cm LVIDs:         2.90 cm LV PW:         1.10 cm LV IVS:        1.60 cm LVOT diam:     2.10 cm LV SV:         94 LV SV Index:   52 LVOT Area:     3.46 cm   RIGHT VENTRICLE             IVC RV S prime:     17.20 cm/s  IVC diam: 1.20 cm TAPSE (M-mode): 2.4 cm  LEFT ATRIUM             Index        RIGHT ATRIUM           Index LA diam:        3.70 cm 2.05 cm/m   RA Pressure: 3.00 mmHg LA Vol (A2C):   24.8 ml 13.73 ml/m  RA Area:     13.30 cm LA Vol (A4C):   42.1 ml 23.31 ml/m  RA Volume:   33.70 ml  18.66 ml/m LA Biplane Vol: 35.5 ml 19.65 ml/m AORTIC VALVE LVOT Vmax:   145.00 cm/s LVOT Vmean:  83.800 cm/s LVOT VTI:    0.272 m  AORTA Ao Root diam: 3.70 cm Ao Asc diam:  4.10 cm  MITRAL VALVE               TRICUSPID VALVE MV Area (PHT): 3.56 cm    Estimated RAP:  3.00 mmHg MV Decel Time: 213 msec MV E velocity: 85.40 cm/s  SHUNTS MV A velocity: 98.00 cm/s  Systemic VTI:  0.27 m MV E/A ratio:  0.87        Systemic Diam: 2.10 cm  Zoila Shutter MD Electronically signed by Zoila Shutter MD Signature Date/Time: 12/09/2023/5:08:42 PM    Final    CT SCANS  CT CARDIAC SCORING (SELF PAY ONLY) 10/02/2020  Addendum 10/02/2020  5:31 PM ADDENDUM REPORT: 10/02/2020 17:29  CLINICAL DATA:  Risk stratification  EXAM: Coronary Calcium Score  TECHNIQUE: The patient was scanned on a [employer] scanner. Axial non-contrast 3 mm slices were carried out through the heart. The data set was analyzed on a dedicated work station and scored using the Agatson method.  FINDINGS: Non-cardiac: See separate report from [facility].  Ascending Aorta: Normal  Pericardium: Normal  Coronary arteries: Normal  Origin.  IMPRESSION: Coronary calcium score of 564. This was 73 percentile for age and sex matched control.  Kardie Tobb,DO   Electronically Signed By: Thomasene Ripple DO On: 10/02/2020 17:29  Narrative EXAM: OVER-READ INTERPRETATION  CT CHEST  The following report is an over-read performed by radiologist Dr. Trudie Reed of [facility] on 10/02/2020. This over-read does not include interpretation of cardiac or coronary anatomy or pathology. The coronary calcium score interpretation by the cardiologist is attached.  COMPARISON:  None.  FINDINGS: Dilatation of the pulmonic trunk (3.8 cm in diameter). Aortic atherosclerosis. Several tiny 2-3 mm pulmonary nodules scattered throughout the lungs. Within the visualized portions of the thorax there are no other larger more suspicious appearing pulmonary nodules or masses, there is no acute consolidative airspace disease, no pleural effusions, no pneumothorax and no lymphadenopathy. Visualized portions of the upper abdomen demonstrates diffuse low attenuation throughout the visualized hepatic  parenchyma. There are no aggressive appearing lytic or blastic lesions noted in the visualized portions of the skeleton.  IMPRESSION: 1. Tiny pulmonary nodules measuring 2-3 mm in size, nonspecific, but statistically likely benign. No follow-up needed if patient is low-risk (and has no known or suspected primary neoplasm). Non-contrast chest CT can be considered in 12 months if patient is high-risk. This recommendation follows the consensus statement: Guidelines for Management of Incidental Pulmonary Nodules Detected on CT Images: From the Fleischner Society 2017; Radiology 2017; 284:228-243. 2. Dilatation of the pulmonic trunk (3.8 cm in diameter), concerning for pulmonary arterial hypertension. 3. Hepatic steatosis.  Electronically Signed: By: Trudie Reed M.D. On: 10/02/2020 11:01           Labs/Other Tests and  Data Reviewed:    EKG:  No ECG reviewed.  Recent Labs: 04/06/2023: TSH 1.020 08/13/2023: ALT 18; BUN 7; Creatinine, Ser 0.60; Hemoglobin 14.4; Platelets 175; Potassium 4.1; Sodium 138   Recent Lipid Panel Lab Results  Component Value Date/Time   CHOL 221 (H) 01/15/2022 10:30 AM   TRIG 163 (H) 01/15/2022 10:30 AM   HDL 59 01/15/2022 10:30 AM   CHOLHDL 3.7 01/15/2022 10:30 AM   CHOLHDL 2.0 08/05/2017 10:17 AM   LDLCALC 133 (H) 01/15/2022 10:30 AM    Wt Readings from Last 3 Encounters:  10/13/23 175 lb (79.4 kg)  09/08/23 176 lb 6.4 oz (80 kg)  05/10/23 177 lb 9.6 oz (80.6 kg)     Risk Assessment/Calculations:          Objective:    Vital Signs:  LMP  (LMP Unknown)    VITAL SIGNS:  reviewed  ASSESSMENT & PLAN:    Hypertension: blood pressure is borderline high.  Although valsartan is listed on her medication list, she is adamant that she has been receiving valsartan in the mail.  Although neither her PCP or cardiology service has prescribed valsartan since January of this year and that prescription was only for 30 days.  I asked her to continue to monitor blood pressure, if systolic blood pressure keep peaking into the 140 or higher, she will need to contact cardiology service for medication titration  Chest pain: Recent symptoms appears to be musculoskeletal in etiology.  Echocardiogram obtained on 12/09/2023 showed EF 60 to 65%, no wall motion abnormality.  Chest pain has resolved.  Given resolution, no further workup.         Time:   Today, I have spent 8 minutes with the patient with telehealth technology discussing the above problems.     Medication Adjustments/Labs and Tests Ordered: Current medicines are reviewed at length with the patient today.  Concerns regarding medicines are outlined above.   Tests Ordered: No orders of the defined types were placed in this encounter.   Medication Changes: No orders of the defined types were placed in this  encounter.   Follow Up:  In Person in 1 year(s)  Signed, Azalee Course, [place]  12/18/2023 6:00 PM    [facility]

## 2023-12-17 NOTE — Patient Instructions (Addendum)
Medication Instructions:  NO CHANGES *If you need a refill on your cardiac medications before your next appointment, please call your pharmacy*   Lab Work: NO LABS If you have labs (blood work) drawn today and your tests are completely normal, you will receive your results only by: MyChart Message (if you have MyChart) OR A paper copy in the mail If you have any lab test that is abnormal or we need to change your treatment, we will call you to review the results.   Testing/Procedures: NO TESTING   Follow-Up: At [facility], you and your health needs are our priority.  As part of our continuing mission to provide you with exceptional heart care, we have created designated Provider Care Teams.  These Care Teams include your primary Cardiologist (physician) and Advanced Practice Providers (APPs -  Physician Assistants and Nurse Practitioners) who all work together to provide you with the care you need, when you need it.    Your next appointment:   1 year(s)  Provider:   Bryan Lemma, MD

## 2023-12-17 NOTE — Telephone Encounter (Signed)
Patient identification verified by 2 forms. Marilynn Rail, RN    Called and spoke to patient  Informed patient PA Lisabeth Devoid okay with changing to virtual, BP would need to be checked prior  Patient agrees, no questions at this time

## 2023-12-24 ENCOUNTER — Telehealth: Payer: Self-pay | Admitting: Medical

## 2023-12-24 NOTE — Telephone Encounter (Signed)
Pt received letter from [facility] that she is approved for another year for assistance with REPATHA.

## 2024-01-07 ENCOUNTER — Other Ambulatory Visit (HOSPITAL_COMMUNITY): Payer: Medicare PPO

## 2024-01-11 ENCOUNTER — Other Ambulatory Visit: Payer: Self-pay | Admitting: Medical

## 2024-01-11 ENCOUNTER — Encounter: Payer: Self-pay | Admitting: Internal Medicine

## 2024-01-11 ENCOUNTER — Telehealth: Payer: Self-pay | Admitting: Cardiology

## 2024-01-11 ENCOUNTER — Other Ambulatory Visit (HOSPITAL_COMMUNITY): Payer: Self-pay

## 2024-01-11 MED ORDER — REPATHA 140 MG/ML ~~LOC~~ SOSY
140.0000 mg | PREFILLED_SYRINGE | SUBCUTANEOUS | 0 refills | Status: DC
  Filled 2024-01-11: qty 2, 28d supply, fill #0

## 2024-01-11 NOTE — Telephone Encounter (Signed)
*  STAT* If patient is at the pharmacy, call can be transferred to refill team.   1. Which medications need to be refilled? (please list name of each medication and dose if known) valsartan  (DIOVAN ) 80 MG tablet    2. Would you like to learn more about the convenience, safety, & potential cost savings by using the [facility]?      3. Are you open to using the [facility] (Type [facility].  ).   4. Which pharmacy/location (including street and city if local pharmacy) is medication to be sent to? [id number] - [place], [place] - [address] AT [facility] [address]    5. Do they need a 30 day or 90 day supply? 30

## 2024-01-12 ENCOUNTER — Other Ambulatory Visit (HOSPITAL_COMMUNITY): Payer: Self-pay

## 2024-01-12 ENCOUNTER — Other Ambulatory Visit: Payer: Self-pay

## 2024-01-12 MED ORDER — VALSARTAN 80 MG PO TABS: 80.0000 mg | ORAL_TABLET | Freq: Every day | ORAL | 2 refills | Status: DC

## 2024-01-18 ENCOUNTER — Other Ambulatory Visit (HOSPITAL_COMMUNITY): Payer: Self-pay

## 2024-02-09 ENCOUNTER — Telehealth: Payer: Self-pay | Admitting: Medical

## 2024-02-09 NOTE — Telephone Encounter (Signed)
Vernona Rieger said she placed it in your folder

## 2024-02-09 NOTE — Telephone Encounter (Signed)
Placard mailed to pt.

## 2024-02-09 NOTE — Telephone Encounter (Signed)
Pt called for Handicap placard renewal, put one in Shane's folder, she also would like a permanent which lasts for 5 years instead of having to get it every 6 months.

## 2024-02-18 ENCOUNTER — Other Ambulatory Visit: Payer: Self-pay | Admitting: Medical

## 2024-03-01 ENCOUNTER — Other Ambulatory Visit: Payer: Self-pay | Admitting: Medical

## 2024-03-01 ENCOUNTER — Other Ambulatory Visit: Payer: Self-pay

## 2024-03-01 ENCOUNTER — Other Ambulatory Visit (HOSPITAL_COMMUNITY): Payer: Self-pay

## 2024-03-01 MED ORDER — REPATHA 140 MG/ML ~~LOC~~ SOSY
140.0000 mg | PREFILLED_SYRINGE | SUBCUTANEOUS | 1 refills | Status: DC
  Filled 2024-03-01: qty 2, 28d supply, fill #0
  Filled 2024-03-28: qty 2, 28d supply, fill #1

## 2024-03-22 NOTE — Telephone Encounter (Signed)
 Message was sent to the wrong office.  Copied from CRM [id number]. Topic: General - Other >> Mar 22, 2024 10:12 AM Patsy Lager T wrote: Reason for CRM: patient called stated she needs Vernona Rieger to call her back to discuss getting medication for her brother in law. He is not a patient at [facility]. Please f/u with patient

## 2024-03-28 ENCOUNTER — Other Ambulatory Visit (HOSPITAL_COMMUNITY): Payer: Self-pay

## 2024-04-20 ENCOUNTER — Ambulatory Visit: Payer: Medicare PPO

## 2024-04-20 VITALS — BP 154/76 | Ht 62.0 in | Wt 168.0 lb

## 2024-04-20 DIAGNOSIS — Z0000 Encounter for general adult medical examination without abnormal findings: Secondary | ICD-10-CM | POA: Diagnosis not present

## 2024-04-20 DIAGNOSIS — R262 Difficulty in walking, not elsewhere classified: Secondary | ICD-10-CM | POA: Diagnosis not present

## 2024-04-20 DIAGNOSIS — Z2821 Immunization not carried out because of patient refusal: Secondary | ICD-10-CM

## 2024-04-20 NOTE — Progress Notes (Signed)
 Because this visit was a virtual/telehealth visit,  certain criteria was not obtained, such a blood pressure, CBG if applicable, and timed get up and go. Any medications not marked as "taking" were not mentioned during the medication reconciliation part of the visit. Any vitals not documented were not able to be obtained due to this being a telehealth visit or patient was unable to self-report a recent blood pressure reading due to a lack of equipment at home via telehealth. Vitals that have been documented are verbally provided by the patient.  Subjective:   [name] is a 73 y.o. who presents for a Medicare Wellness preventive visit.  Visit Complete: Virtual I connected with  [name] on 04/20/24 by a audio enabled telemedicine application and verified that I am speaking with the correct person using two identifiers.  Patient Location: Home  Provider Location: Home Office  I discussed the limitations of evaluation and management by telemedicine. The patient expressed understanding and agreed to proceed.  Vital Signs: Because this visit was a virtual/telehealth visit, some criteria may be missing or patient reported. Any vitals not documented were not able to be obtained and vitals that have been documented are patient reported.  VideoDeclined- This patient declined Librarian, academic. Therefore the visit was completed with audio only.  Persons Participating in Visit: Patient.  AWV Questionnaire: No: Patient Medicare AWV questionnaire was not completed prior to this visit.  Cardiac Risk Factors include: advanced age (>15men, >55 women);hypertension;dyslipidemia     Objective:    Today's Vitals   04/20/24 1315 04/20/24 1316  BP: (!) 154/76   Weight: 168 lb (76.2 kg)   Height: 5\' 2"  (1.575 m)   PainSc:  5    Body mass index is 30.73 kg/m.     04/20/2024    1:12 PM 01/19/2023    2:47 PM 10/29/2020   11:11 AM 08/01/2016    2:10 AM 03/18/2016     1:42 PM  Advanced Directives  Does Patient Have a Medical Advance Directive? No No No No No  Would patient like information on creating a medical advance directive? No - Patient declined Yes (MAU/Ambulatory/Procedural Areas - Information given) No - Patient declined No - patient declined information No - patient declined information    Current Medications (verified) Outpatient Encounter Medications as of 04/20/2024  Medication Sig   amLODipine  (NORVASC ) 5 MG tablet TAKE 1 TABLET (5 MG TOTAL) BY MOUTH DAILY.   Evolocumab  (REPATHA ) 140 MG/ML SOSY Inject 140 mg into the skin every 14 (fourteen) days.   ibuprofen (ADVIL) 200 MG tablet Take 600 mg by mouth every 6 (six) hours as needed.   valsartan  (DIOVAN ) 80 MG tablet Take 1 tablet (80 mg total) by mouth daily.   hydrochlorothiazide  (HYDRODIURIL ) 25 MG tablet TAKE 1 TABLET (25 MG TOTAL) BY MOUTH DAILY. (Patient not taking: Reported on 04/20/2024)   Vitamin D , Ergocalciferol , (DRISDOL ) 1.25 MG (50000 UNIT) CAPS capsule Take 1 capsule (50,000 Units total) by mouth every 7 (seven) days. (Patient not taking: Reported on 04/20/2024)   No facility-administered encounter medications on file as of 04/20/2024.    Allergies (verified) Covid-19 (mrna) vaccine, Morphine, and Morphine and codeine   History: Past Medical History:  Diagnosis Date   Allergic rhinitis, cause unspecified    Arthritis    Chewing tobacco use    Coronary artery disease, non-occlusive December 2006   Dyslipidemia    Essential hypertension    previously uncontrolled, now well-controlled; no evidence of  renal artery stenosis   GERD (gastroesophageal reflux disease)    H/O echocardiogram 12/2014   60-65 % EF, mild basal hypertrophy focally at septum, otherwise normal   Heart murmur 12/2014   mild focal hypertrophy of septum, EF 60-65%, trivial aortic regurgitation   History of cardiac catheterization December 2006   for exertional chest pain: Nonobstructive CAD   History of  migraine headaches    not an issues as of 07/2017   Onychomycosis 05/2015   Pneumococcal vaccine refused 05/2015   Past Surgical History:  Procedure Laterality Date   ABDOMINAL HYSTERECTOMY     PARTIAL (benign, for bleeding), still has ovaries   CAROTID ANGIOGRAM  2005   COLONOSCOPY  2009   Dr. Tova Fresh   ESOPHAGOGASTRODUODENOSCOPY  2009   Dr. Tova Fresh   EYE SURGERY     new lenses 07/2017   LAPAROSCOPY  12/02   ovarian cyst   LEFT HEART CATH AND CORONARY ANGIOGRAPHY  12/06   Dr. Nathen Balder; no significant CAD. Normal LV function with EF of 60-65%. Also noted no RAS.   NM MYOVIEW  LTD  12/2014   LOW RISK.  EF 63%.  Fixed Small size mild intensity distal anteroapical defect suggestive of breast attenuation.  No reversibility. = Breast attenuation.  NO INFARCT OR ISCHEMIA.   TRANSTHORACIC ECHOCARDIOGRAM  12/2014   EF 60 to 65%.  Normal wall motion and no valvular lesions.  Normal LV filling pressures.   Family History  Problem Relation Age of Onset   Heart disease Mother        CHF   Hypertension Mother    Heart disease Father 40       MI   Heart disease Brother    Heart disease Maternal Aunt    Diabetes Maternal Aunt    Cancer Maternal Aunt        cervical   Heart disease Maternal Uncle    Diabetes Maternal Uncle    Heart disease Maternal Grandmother        CHF   Diabetes Maternal Grandmother    Heart disease Paternal Uncle    Heart disease Brother    Heart disease Brother    Social History   Socioeconomic History   Marital status: Married    Spouse name: Not on file   Number of children: 3   Years of education: Not on file   Highest education level: Not on file  Occupational History   Occupation: Conservation officer, 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   Drug use: No   Sexual activity: Never    Birth control/protection: None  Other Topics Concern   Not on file  Social History Narrative   Widowed 2016.  Lives with boyfriend of 7 years.  Was married 43 years with Amada Jun, widowed. Still rides motorcycle.     Has been dipping snuff since she was a teenager.   She drinks 2-3 Starlin Echevaria Beers most days out of the week.  12/2021   Social Drivers of Health   Financial Resource Strain: Low Risk  (  04/20/2024)   Overall Financial Resource Strain (CARDIA)    Difficulty of Paying Living Expenses: Not hard at all  Food Insecurity: No Food Insecurity (04/20/2024)   Hunger Vital Sign    Worried About Running Out of Food in the Last Year: Never true    Ran Out of Food in the Last Year: Never true  Transportation Needs: No Transportation Needs (04/20/2024)   PRAPARE - Administrator, Civil Service (Medical): No    Lack of Transportation (Non-Medical): No  Physical Activity: Sufficiently Active (04/20/2024)   Exercise Vital Sign    Days of Exercise per Week: 6 days    Minutes of Exercise per Session: 40 min  Stress: No Stress Concern Present (04/20/2024)   [employer] of Occupational Health - Occupational Stress Questionnaire    Feeling of Stress : Only a little  Social Connections: Moderately Integrated (04/20/2024)   Social Connection and Isolation Panel [NHANES]    Frequency of Communication with Friends and Family: More than three times a week    Frequency of Social Gatherings with Friends and Family: Once a week    Attends Religious Services: 1 to 4 times per year    Active Member of [employer] or Organizations: No    Attends Engineer, structural: Never    Marital Status: Living with partner    Tobacco Counseling Ready to quit: Not Answered Counseling given: Not Answered Tobacco comments: has been  using since she was a teenager    Clinical Intake:  Pre-visit preparation completed: Yes  Pain : 0-10 Pain Score: 5  Pain Type: Chronic pain Pain Location: Knee Pain Orientation: Right, Left Pain Onset: More than a month ago Pain Frequency: Occasional Pain Relieving Factors: she take some advil for pain  Pain Relieving Factors: she take some advil for pain  BMI - recorded: 30.73 Nutritional Status: BMI > 30  Obese Nutritional Risks: None Diabetes: No  Lab Results  Component Value Date   HGBA1C 5.9 (H) 02/04/2023   HGBA1C 5.5 01/15/2022   HGBA1C 5.7 (H) 09/11/2020     How often do you need to have someone help you when you read instructions, pamphlets, or other written materials from your doctor or pharmacy?: 1 - Never What is the last grade level you completed in school?: GED  Interpreter Needed?: No  Information entered by :: Juliann Ochoa   Activities of Daily Living     02/03/2024    9:13 AM  In your present state of health, do you have any difficulty performing the following activities:  Hearing? 0  Vision? 0  Difficulty concentrating or making decisions? 0  Walking or climbing stairs? 1  Dressing or bathing? 0  Doing errands, shopping? 1   1  Comment patient can not walk and live with someone  Preparing Food and eating ? N  Using the Toilet? N  In the past six months, have you accidently leaked urine? N   Y  Comment patient states her bladder is getting weak  Do you have problems with loss of bowel control? N  Managing your Medications? N  Managing your Finances? N  Housekeeping or managing your Housekeeping? N   Y  Comment patient roomate helps    Patient Care Team: Tysinger, Newt Barefoot as PCP - General (Family Medicine) Arleen Lacer, MD as PCP - Cardiology (Cardiology)  Indicate any recent Medical Services you may have received from other than [facility] providers in the past year (date may be  approximate).     Assessment:   This  is a routine wellness examination for [name].  Hearing/Vision screen Hearing Screening - Comments:: No hearing difficulties Vision Screening - Comments:: Wears OTC readers   Goals Addressed   None    Depression Screen     04/20/2024    1:27 PM 05/10/2023    9:45 AM 01/19/2023    2:41 PM 01/15/2022    9:31 AM 02/23/2020    9:04 AM 09/21/2018   11:02 AM 08/05/2017   10:32 AM  PHQ 2/9 Scores  PHQ - 2 Score 1 0 0 2 0 3 0  PHQ- 9 Score 1   6  15      Fall Risk     04/20/2024    1:22 PM 02/03/2024    9:13 AM 05/10/2023    9:45 AM 01/19/2023    2:35 PM 01/15/2022    9:30 AM  Fall Risk   Falls in the past year? 0 0 0 0 0  Number falls in past yr: 0 0 0 0 0  Injury with Fall? 0 0 0 0 0  Risk for fall due to : Impaired mobility;Impaired balance/gait  No Fall Risks Impaired balance/gait;Impaired mobility No Fall Risks  Follow up Falls evaluation completed;Falls prevention discussed  Falls evaluation completed Education provided;Falls prevention discussed Falls evaluation completed    MEDICARE RISK AT HOME:  Medicare Risk at Home Any stairs in or around the home?: No If so, are there any without handrails?: No Home free of loose throw rugs in walkways, pet beds, electrical cords, etc?: Yes Adequate lighting in your home to reduce risk of falls?: Yes Life alert?: Yes Use of a cane, walker or w/c?: Yes (cane and walker) Grab bars in the bathroom?: Yes Shower chair or bench in shower?: Yes Elevated toilet seat or a handicapped toilet?: No  TIMED UP AND GO:  Was the test performed?  No  Cognitive Function: 6CIT completed        04/20/2024    1:20 PM 01/19/2023    2:56 PM  6CIT Screen  What Year? 0 points 0 points  What month? 0 points 0 points  What time? 0 points 0 points  Count back from 20 0 points 0 points  Months in reverse 0 points 0 points  Repeat phrase 0 points 0 points  Total Score 0 points 0 points    Immunizations Immunization History  Administered Date(s)  Administered   Fluad Quad(high Dose 65+) 10/09/2020, 01/15/2022   Influenza, High Dose Seasonal PF 01/19/2017, 11/22/2019   Influenza-Unspecified 11/22/2019   PFIZER(Purple Top)SARS-COV-2 Vaccination 07/25/2020, 08/16/2020   Pneumococcal Conjugate-13 01/19/2017   Pneumococcal Polysaccharide-23 11/04/2005   Td 11/04/2005   Tdap 09/21/2011, 08/01/2016    Screening Tests Health Maintenance  Topic Date Due   Zoster Vaccines- Shingrix (1 of 2) Never done   Pneumonia Vaccine 49+ Years old (3 of 3 - PCV20 or PCV21) 01/19/2022   COVID-19 Vaccine (3 - 2024-25 season) 08/29/2023   MAMMOGRAM  06/11/2024   INFLUENZA VACCINE  07/28/2024   Fecal DNA (Cologuard)  03/10/2025   Medicare Annual Wellness (AWV)  04/20/2025   DTaP/Tdap/Td (4 - Td or Tdap) 08/01/2026   DEXA SCAN  Completed   Hepatitis C Screening  Completed   HPV VACCINES  Aged Out   Meningococcal B Vaccine  Aged Out   Colonoscopy  Discontinued    Health Maintenance  Health Maintenance Due  Topic Date Due   Zoster Vaccines- Shingrix (1 of  2) Never done   Pneumonia Vaccine 80+ Years old (3 of 3 - PCV20 or PCV21) 01/19/2022   COVID-19 Vaccine (3 - 2024-25 season) 08/29/2023   Health Maintenance Items Addressed:patient declined vaccines  Additional Screening:  Vision Screening: Recommended annual ophthalmology exams for early detection of glaucoma and other disorders of the eye.  Dental Screening: Recommended annual dental exams for proper oral hygiene  Community Resource Referral / Chronic Care Management: CRR required this visit?  No   CCM required this visit?  No     Plan:     I have personally reviewed and noted the following in the patient's chart:   Medical and social history Use of alcohol, tobacco or illicit drugs  Current medications and supplements including opioid prescriptions. Patient is not currently taking opioid prescriptions. Functional ability and status Nutritional status Physical  activity Advanced directives List of other physicians Hospitalizations, surgeries, and ER visits in previous 12 months Vitals Screenings to include cognitive, depression, and falls Referrals and appointments  In addition, I have reviewed and discussed with patient certain preventive protocols, quality metrics, and best practice recommendations. A written personalized care plan for preventive services as well as general preventive health recommendations were provided to patient.     [name], [place]   04/20/2024   After Visit Summary: (MyChart) Due to this being a telephonic visit, the after visit summary with patients personalized plan was offered to patient via MyChart   Notes: Nothing significant to report at this time.

## 2024-04-20 NOTE — Patient Instructions (Signed)
 [name] , Thank you for taking time to come for your Medicare Wellness Visit. I appreciate your ongoing commitment to your health goals. Please review the following plan we discussed and let me know if I can assist you in the future.   Referrals/Orders/Follow-Ups/Clinician Recommendations: follow up in 1 year for next AWV   This is a list of the screening recommended for you and due dates:  Health Maintenance  Topic Date Due   Zoster (Shingles) Vaccine (1 of 2) Never done   Pneumonia Vaccine (3 of 3 - PCV20 or PCV21) 01/19/2022   COVID-19 Vaccine (3 - 2024-25 season) 08/29/2023   Mammogram  06/11/2024   Flu Shot  07/28/2024   Cologuard (Stool DNA test)  03/10/2025   Medicare Annual Wellness Visit  04/20/2025   DTaP/Tdap/Td vaccine (4 - Td or Tdap) 08/01/2026   DEXA scan (bone density measurement)  Completed   Hepatitis C Screening  Completed   HPV Vaccine  Aged Out   Meningitis B Vaccine  Aged Out   Colon Cancer Screening  Discontinued    Advanced directives: (Declined) Advance directive discussed with you today. Even though you declined this today, please call our office should you change your mind, and we can give you the proper paperwork for you to fill out.  Next Medicare Annual Wellness Visit scheduled for next year: Yes

## 2024-04-28 ENCOUNTER — Telehealth: Payer: Self-pay | Admitting: *Deleted

## 2024-04-28 NOTE — Progress Notes (Signed)
 Complex Care Management Note Care Guide Note  04/28/2024 Name: DANIE HODSON MRN: [id number] DOB: 11/21/1951   Complex Care Management Outreach Attempts: An unsuccessful telephone outreach was attempted today to offer the patient information about available complex care management services.  Follow Up Plan:  Additional outreach attempts will be made to offer the patient complex care management information and services.   Encounter Outcome:  No Answer  Barnie Bora  [facility]  [facility], [facility] Guide  Direct Dial: [phone]  Fax [phone]

## 2024-05-04 NOTE — Progress Notes (Signed)
 Complex Care Management Note Care Guide Note  05/04/2024 Name: [name] MRN: [id number] DOB: 03/28/1951   Complex Care Management Outreach Attempts: A second unsuccessful outreach was attempted today to offer the patient with information about available complex care management services.  Follow Up Plan:  Additional outreach attempts will be made to offer the patient complex care management information and services.   Encounter Outcome:  No Answer  Barnie Bora  [facility]  [facility], [facility] Guide  Direct Dial: [phone]  Fax [phone]

## 2024-05-05 NOTE — Progress Notes (Signed)
 Complex Care Management Note  Care Guide Note 05/05/2024 Name: KADYNN SCHANTZ MRN: [id number] DOB: 08/19/1951  MERYSSA BRANDER is a 73 y.o. year old female who sees Tysinger, Christiane Cowing, PA-C for primary care. I reached out to Gable Johann by phone today to offer complex care management services.  Ms. Aceves was given information about Complex Care Management services today including:   The Complex Care Management services include support from the care team which includes your Nurse Care Manager, Clinical Social Worker, or Pharmacist.  The Complex Care Management team is here to help remove barriers to the health concerns and goals most important to you. Complex Care Management services are voluntary, and the patient may decline or stop services at any time by request to their care team member.   Complex Care Management Consent Status: Patient agreed to services and verbal consent obtained.  Gave patient contact information for [employer] [phone] to call to see if they have walker.   Follow up plan:  Telephone appointment with complex care management team member scheduled for:  5/13  Encounter Outcome:  Patient Scheduled  Barnie Bora  [facility]  [facility], [facility] Guide  Direct Dial: [phone]  Fax [phone]

## 2024-05-09 ENCOUNTER — Other Ambulatory Visit: Payer: Self-pay | Admitting: Licensed Clinical Social Worker

## 2024-05-09 NOTE — Patient Outreach (Signed)
 Complex Care Management   Visit Note  05/09/2024  Name:  [name] MRN: [id number] DOB: 1951-05-16  Situation: Referral received for Complex Care Management related to Need of a Rolator walker I obtained verbal consent from Patient.  Visit completed with patient  on the phone  Background:   Past Medical History:  Diagnosis Date   Allergic rhinitis, cause unspecified    Arthritis    Chewing tobacco use    Coronary artery disease, non-occlusive December 2006   Dyslipidemia    Essential hypertension    previously uncontrolled, now well-controlled; no evidence of renal artery stenosis   GERD (gastroesophageal reflux disease)    H/O echocardiogram 12/2014   60-65 % EF, mild basal hypertrophy focally at septum, otherwise normal   Heart murmur 12/2014   mild focal hypertrophy of septum, EF 60-65%, trivial aortic regurgitation   History of cardiac catheterization December 2006   for exertional chest pain: Nonobstructive CAD   History of migraine headaches    not an issues as of 07/2017   Onychomycosis 05/2015   Pneumococcal vaccine refused 05/2015    Assessment:  SDOH Screenings   Food Insecurity: No Food Insecurity (05/09/2024)  Housing: Low Risk  (05/09/2024)  Transportation Needs: No Transportation Needs (05/09/2024)  Utilities: Not At Risk (05/09/2024)  Alcohol Screen: Low Risk  (04/20/2024)  Depression (PHQ2-9): Low Risk  (04/20/2024)  Financial Resource Strain: Low Risk  (05/09/2024)  Physical Activity: Sufficiently Active (04/20/2024)  Social Connections: Moderately Integrated (04/20/2024)  Stress: No Stress Concern Present (04/20/2024)  Tobacco Use: High Risk (04/20/2024)  Health Literacy: Adequate Health Literacy (04/20/2024)      Recommendation:   none  Follow Up Plan:   Telephone follow up appointment date/time:  05/23/2024 at 1:30 pm  .cns

## 2024-05-09 NOTE — Patient Instructions (Signed)
 Visit Information  Thank you for taking time to visit with me today. Please don't hesitate to contact me if I can be of assistance to you before our next scheduled appointment.  Our next appointment is by telephone on 05/23/2024 at 1:30 pm Please call the care guide team at [phone] if you need to cancel or reschedule your appointment.   Following is a copy of your care plan:   Goals Addressed             This Visit's Progress    BSW VBCI Social Work Care Plan       Problems:   Patient is in need of a Retail banker   CSW Clinical Goal(s):   Over the next 2 weeks the Patient will will follow up with PCP as directed by Social Work.  Interventions:  SW will send the PCP an inbox message and mail the patient a Medical supply list for companies that have walkers  Patient Goals/Self-Care Activities:  Coordinate with PCP to assist with order for walker.  Plan:   Telephone follow up appointment with care management team member scheduled for:  05/23/2024 at 1:30 pm        Please call the Suicide and Crisis Lifeline: 988 go to [facility] [address], [place] [phone]) call 911 if you are experiencing a Mental Health or Behavioral Health Crisis or need someone to talk to.  Patient verbalizes understanding of instructions and care plan provided today and agrees to view in MyChart. Active MyChart status and patient understanding of how to access instructions and care plan via MyChart confirmed with patient.     Jonda Neighbours, PhD [facility], [facility] Social Worker Direct Dial: [phone]  Fax: [phone]

## 2024-05-11 ENCOUNTER — Other Ambulatory Visit: Payer: Self-pay | Admitting: Medical

## 2024-05-11 ENCOUNTER — Other Ambulatory Visit (HOSPITAL_COMMUNITY): Payer: Self-pay

## 2024-05-11 MED ORDER — REPATHA 140 MG/ML ~~LOC~~ SOSY
140.0000 mg | PREFILLED_SYRINGE | SUBCUTANEOUS | 1 refills | Status: DC
  Filled 2024-05-11: qty 2, 28d supply, fill #0

## 2024-05-16 ENCOUNTER — Other Ambulatory Visit (HOSPITAL_COMMUNITY): Payer: Self-pay

## 2024-05-23 ENCOUNTER — Other Ambulatory Visit: Payer: Self-pay | Admitting: Licensed Clinical Social Worker

## 2024-05-23 NOTE — Patient Outreach (Signed)
 Complex Care Management   Visit Note  05/23/2024  Name:  [name] MRN: [id number] DOB: 09/26/1951  Situation: Referral received for Complex Care Management related to SDOH Barriers:  order for a walker Rolator I obtained verbal consent from Patient.  Visit completed with patient  on the phone  Background:   Past Medical History:  Diagnosis Date   Allergic rhinitis, cause unspecified    Arthritis    Chewing tobacco use    Coronary artery disease, non-occlusive December 2006   Dyslipidemia    Essential hypertension    previously uncontrolled, now well-controlled; no evidence of renal artery stenosis   GERD (gastroesophageal reflux disease)    H/O echocardiogram 12/2014   60-65 % EF, mild basal hypertrophy focally at septum, otherwise normal   Heart murmur 12/2014   mild focal hypertrophy of septum, EF 60-65%, trivial aortic regurgitation   History of cardiac catheterization December 2006   for exertional chest pain: Nonobstructive CAD   History of migraine headaches    not an issues as of 07/2017   Onychomycosis 05/2015   Pneumococcal vaccine refused 05/2015    Assessment:Patient has not heard from the PCP regarding th order and stated taht she has an upcoming appointment with the PCP, and will discuss with hime them.   SDOH Interventions    Flowsheet Row Patient Outreach from 05/23/2024 in [place] [facility] Patient Outreach Telephone from 05/09/2024 in [place] [facility] Clinical Support from 04/20/2024 in [facility] Care Coordination from 01/26/2023 in [facility] Community Care Coordination Clinical Support from 01/19/2023 in [facility] Clinical Support from 01/15/2022 in [facility]  SDOH Interventions        Food Insecurity Interventions Intervention Not Indicated Intervention Not Indicated Intervention Not Indicated Intervention Not Indicated [id number] Referral --  Housing  Interventions Intervention Not Indicated Intervention Not Indicated Intervention Not Indicated Intervention Not Indicated Intervention Not Indicated --  Transportation Interventions Intervention Not Indicated Intervention Not Indicated Intervention Not Indicated Intervention Not Indicated Intervention Not Indicated --  Utilities Interventions Intervention Not Indicated Intervention Not Indicated Intervention Not Indicated Intervention Not Indicated Intervention Not Indicated --  Alcohol Usage Interventions -- -- Intervention Not Indicated (Score <7) -- Intervention Not Indicated (Score <7) --  Depression Interventions/Treatment  -- -- VHQ4-6 Score <4 Follow-up Not Indicated -- -- Counseling  Financial Strain Interventions Intervention Not Indicated Intervention Not Indicated Intervention Not Indicated -- Intervention Not Indicated --  Physical Activity Interventions -- -- Intervention Not Indicated -- Intervention Not Indicated --  Stress Interventions -- -- Intervention Not Indicated -- Intervention Not Indicated --  Social Connections Interventions -- -- Intervention Not Indicated -- Intervention Not Indicated --  Health Literacy Interventions -- -- Intervention Not Indicated -- -- --         Recommendation:   none  Follow Up Plan:   Telephone follow up appointment date/time:  06/06/2024 at 1:30 pm  Jonda Neighbours, PhD [facility], [facility] Social Worker Direct Dial: [phone]  Fax: [phone]

## 2024-05-23 NOTE — Patient Instructions (Signed)
 Visit Information  Thank you for taking time to visit with me today. Please don't hesitate to contact me if I can be of assistance to you before our next scheduled appointment.  Your next care management appointment is by telephone on 06/06/2024 at 1:30 pm    Please call the care guide team at [phone] if you need to cancel, schedule, or reschedule an appointment.   Please call the Suicide and Crisis Lifeline: 988 call 1-800-273-TALK (toll free, 24 hour hotline) go to [facility] Urgent [facility] [address], [place] [phone]) call 911 if you are experiencing a Mental Health or Behavioral Health Crisis or need someone to talk to.  Jonda Neighbours, PhD [facility], [facility] Social Worker Direct Dial: [phone]  Fax: [phone]

## 2024-05-30 ENCOUNTER — Encounter: Payer: Self-pay | Admitting: Medical

## 2024-05-30 ENCOUNTER — Telehealth: Payer: Self-pay | Admitting: Internal Medicine

## 2024-05-30 ENCOUNTER — Ambulatory Visit (INDEPENDENT_AMBULATORY_CARE_PROVIDER_SITE_OTHER): Admitting: Medical

## 2024-05-30 VITALS — BP 140/80 | HR 74 | Ht 63.0 in | Wt 176.8 lb

## 2024-05-30 DIAGNOSIS — R6889 Other general symptoms and signs: Secondary | ICD-10-CM | POA: Diagnosis not present

## 2024-05-30 DIAGNOSIS — R262 Difficulty in walking, not elsewhere classified: Secondary | ICD-10-CM

## 2024-05-30 DIAGNOSIS — Z720 Tobacco use: Secondary | ICD-10-CM | POA: Diagnosis not present

## 2024-05-30 DIAGNOSIS — I10 Essential (primary) hypertension: Secondary | ICD-10-CM | POA: Diagnosis not present

## 2024-05-30 DIAGNOSIS — R29898 Other symptoms and signs involving the musculoskeletal system: Secondary | ICD-10-CM | POA: Diagnosis not present

## 2024-05-30 DIAGNOSIS — E785 Hyperlipidemia, unspecified: Secondary | ICD-10-CM | POA: Diagnosis not present

## 2024-05-30 DIAGNOSIS — K760 Fatty (change of) liver, not elsewhere classified: Secondary | ICD-10-CM | POA: Diagnosis not present

## 2024-05-30 DIAGNOSIS — Z0000 Encounter for general adult medical examination without abnormal findings: Secondary | ICD-10-CM | POA: Diagnosis not present

## 2024-05-30 DIAGNOSIS — R7989 Other specified abnormal findings of blood chemistry: Secondary | ICD-10-CM | POA: Diagnosis not present

## 2024-05-30 DIAGNOSIS — F1011 Alcohol abuse, in remission: Secondary | ICD-10-CM | POA: Diagnosis not present

## 2024-05-30 DIAGNOSIS — Z1231 Encounter for screening mammogram for malignant neoplasm of breast: Secondary | ICD-10-CM | POA: Diagnosis not present

## 2024-05-30 DIAGNOSIS — E559 Vitamin D deficiency, unspecified: Secondary | ICD-10-CM

## 2024-05-30 DIAGNOSIS — R7301 Impaired fasting glucose: Secondary | ICD-10-CM | POA: Diagnosis not present

## 2024-05-30 LAB — URINALYSIS, ROUTINE W REFLEX MICROSCOPIC
Bilirubin, UA: NEGATIVE
Glucose, UA: NEGATIVE
Ketones, UA: NEGATIVE
Leukocytes,UA: NEGATIVE
Nitrite, UA: NEGATIVE
Protein,UA: NEGATIVE
RBC, UA: NEGATIVE
Specific Gravity, UA: 1.011 (ref 1.005–1.030)
Urobilinogen, Ur: 1 mg/dL (ref 0.2–1.0)
pH, UA: 6.5 (ref 5.0–7.5)

## 2024-05-30 LAB — LIPID PANEL

## 2024-05-30 NOTE — Progress Notes (Signed)
 Subjective:    [name] is a 73 y.o. female who presents for Preventative Services visit and chronic medical problems/med check visit.    Chief Complaint  Patient presents with   Annual Exam    Fasting cpe, AWV done on 04/18/24, would like feet looked at. Would like to get a rollator walker with seat. Currently borrowing someone else rollator to move around with. No other concerns. No update in medical history. Declines vaccines. Does not want to go back to see Dr. Gracie Lav at neurology    Primary Care Provider Hatsumi Steinhart, Christiane Cowing, PA-C here for primary care  Current Health Care Team: Dentist, none Eye doctor, none Dr. Randene Bustard, cardiology Dr. Phebe Brasil, neurology   Concerns: Wants script for rollator walker.  Using a friends and it has been very helpful.   She has leg weakness, leg numbness, hx/o falls.   She is trying to get back to more walking.  HTN - not checking BPs.  Compliant with medicaiton  Hyperlipidemia - compliant with Repatha  without c/o.  Uses cane in the house.    Takes care of her own ADLs, finances.     She doesn't want pneumococcal vaccine  She still chews tobacco.  Drinks about 3 miller lights daily still    Depression Screen    04/20/2024    1:27 PM  Depression screen PHQ 2/9  Decreased Interest 1  Down, Depressed, Hopeless 0  PHQ - 2 Score 1  Altered sleeping 0  Tired, decreased energy 0  Change in appetite 0  Feeling bad or failure about yourself  0  Trouble concentrating 0  Moving slowly or fidgety/restless 0  Suicidal thoughts 0  PHQ-9 Score 1  Difficult doing work/chores Not difficult at all    Fall Risk Screen    05/30/2024    9:51 AM 04/20/2024    1:22 PM 02/03/2024    9:13 AM 05/10/2023    9:45 AM 01/19/2023    2:35 PM  Fall Risk   Falls in the past year? 0 0 0 0 0  Number falls in past yr: 0 0 0 0 0  Injury with Fall? 0 0 0 0 0  Risk for fall due to : No Fall Risks Impaired mobility;Impaired balance/gait  No Fall Risks  Impaired balance/gait;Impaired mobility  Follow up Falls evaluation completed Falls evaluation completed;Falls prevention discussed  Falls evaluation completed Education provided;Falls prevention discussed   Past Medical History:  Diagnosis Date   Allergic rhinitis, cause unspecified    Arthritis    Chewing tobacco use    Coronary artery disease, non-occlusive December 2006   Dyslipidemia    Essential hypertension    previously uncontrolled, now well-controlled; no evidence of renal artery stenosis   GERD (gastroesophageal reflux disease)    H/O echocardiogram 12/2014   60-65 % EF, mild basal hypertrophy focally at septum, otherwise normal   Heart murmur 12/2014   mild focal hypertrophy of septum, EF 60-65%, trivial aortic regurgitation   History of cardiac catheterization December 2006   for exertional chest pain: Nonobstructive CAD   History of migraine headaches    not an issues as of 07/2017   Onychomycosis 05/2015   Pneumococcal vaccine refused 05/2015    Past Surgical History:  Procedure Laterality Date   ABDOMINAL HYSTERECTOMY     PARTIAL (benign, for bleeding), still has ovaries   CAROTID ANGIOGRAM  2005   COLONOSCOPY  2009   Dr. Tova Fresh   ESOPHAGOGASTRODUODENOSCOPY  2009   Dr.  Mann   EYE SURGERY     new lenses 07/2017   LAPAROSCOPY  12/02   ovarian cyst   LEFT HEART CATH AND CORONARY ANGIOGRAPHY  12/06   Dr. Nathen Balder; no significant CAD. Normal LV function with EF of 60-65%. Also noted no RAS.   NM MYOVIEW  LTD  12/2014   LOW RISK.  EF 63%.  Fixed Small size mild intensity distal anteroapical defect suggestive of breast attenuation.  No reversibility. = Breast attenuation.  NO INFARCT OR ISCHEMIA.   TRANSTHORACIC ECHOCARDIOGRAM  12/2014   EF 60 to 65%.  Normal wall motion and no valvular lesions.  Normal LV filling pressures.    Current Outpatient Medications on File Prior to Visit  Medication Sig Dispense Refill   amLODipine  (NORVASC ) 5 MG tablet TAKE 1 TABLET (5 MG  TOTAL) BY MOUTH DAILY. 90 tablet 3   Evolocumab  (REPATHA ) 140 MG/ML SOSY Inject 140 mg into the skin every 14 (fourteen) days. 2 mL 1   hydrochlorothiazide  (HYDRODIURIL ) 25 MG tablet TAKE 1 TABLET (25 MG TOTAL) BY MOUTH DAILY. 90 tablet 0   ibuprofen (ADVIL) 200 MG tablet Take 600 mg by mouth every 6 (six) hours as needed.     valsartan  (DIOVAN ) 80 MG tablet Take 1 tablet (80 mg total) by mouth daily. 90 tablet 2   Vitamin D , Ergocalciferol , (DRISDOL ) 1.25 MG (50000 UNIT) CAPS capsule Take 1 capsule (50,000 Units total) by mouth every 7 (seven) days. (Patient not taking: Reported on 05/30/2024) 12 capsule 3   No current facility-administered medications on file prior to visit.    History reviewed: allergies, current medications, past family history, past medical history, past social history, past surgical history and problem list    Objective:     BP (!) 140/80 Comment: normal for patient  Pulse 74   Ht 5\' 3"  (1.6 m)   Wt 176 lb 12.8 oz (80.2 kg)   LMP  (LMP Unknown)   BMI 31.32 kg/m   Wt Readings from Last 3 Encounters:  05/30/24 176 lb 12.8 oz (80.2 kg)  04/20/24 168 lb (76.2 kg)  10/13/23 175 lb (79.4 kg)   BP Readings from Last 3 Encounters:  05/30/24 (!) 140/80  04/20/24 (!) 154/76  09/10/23 (!) 154/76   General: Well-developed well-nourished no acute distress, white female alert and oriented x3 Answers questions appropriately, recall normal, can perform simple calculations HEENT: normocephalic, sclerae anicteric, TMs pearly, nares patent, no discharge or erythema, pharynx normal Neck: supple, no lymphadenopathy, no thyromegaly, no masses, no bruits Heart: 2 out of 6 brief holosystolic murmur heard in upper sternal borders, RRR, normal S1, S2 Lungs: CTA bilaterally, no wheezes, rhonchi, or rales Abdomen: +bs, soft, non tender, non distended, no masses, no hepatomegaly, no splenomegaly Musculoskeletal:  limited mobility, limited exam, nontender, no swelling, no obvious  deformity Extremities: no edema, no cyanosis, no clubbing Pulses: 2+ symmetric, upper and lower extremities, normal cap refill Neurological: alert, oriented x 3, CN2-12 intact, strength normal upper extremities but decreased lower extremities, sensation normal throughout, DTRs 2+ throughout, no cerebellar signs, gait slow due to leg pain, ambulates with cane Psychiatric: normal affect, behavior normal, pleasant  Breast/pelvic - declined     Assessment:   Encounter Diagnoses  Name Primary?   Encounter for health maintenance examination in adult Yes   Essential hypertension, benign    Fatty liver    Impaired fasting blood sugar    Hyperlipidemia, unspecified hyperlipidemia type    Vitamin D  deficiency    Chewing  tobacco use    Abnormal thyroid  blood test    History of alcohol abuse    Leg weakness, bilateral    Difficulty in walking    Screening mammogram for breast cancer      Plan:    This visit was a preventative care visit, also known as wellness visit or routine physical.   Topics typically include healthy lifestyle, diet, exercise, preventative care, vaccinations, sick and well care, proper use of emergency dept and after hours care, as well as other concerns.     Recommendations: Continue to return yearly for your annual wellness and preventative care visits.  This gives us  a chance to discuss healthy lifestyle, exercise, vaccinations, review your chart record, and perform screenings where appropriate.  I recommend you see your eye doctor yearly for routine vision care.  I recommend you see your dentist yearly for routine dental care including hygiene visits twice yearly.   Vaccination recommendations were reviewed Immunization History  Administered Date(s) Administered   Fluad Quad(high Dose 65+) 10/09/2020, 01/15/2022   Influenza, High Dose Seasonal PF 01/19/2017, 11/22/2019   Influenza-Unspecified 11/22/2019   PFIZER(Purple Top)SARS-COV-2 Vaccination  07/25/2020, 08/16/2020   Pneumococcal Conjugate-13 01/19/2017   Pneumococcal Polysaccharide-23 11/04/2005   Td 11/04/2005   Tdap 09/21/2011, 08/01/2016   Counseled on Shingrix, flu, pneumococcal.  She declines vaccines today   Screening for cancer: Colon cancer screening: Cologuard negative 02/2022.  Breast cancer screening: You should perform a self breast exam monthly.   We reviewed recommendations for regular mammograms and breast cancer screening.  Cervical cancer screening: You had a hysterectomy so you no longer need pap smear   Skin cancer screening: Check your skin regularly for new changes, growing lesions, or other lesions of concern Come in for evaluation if you have skin lesions of concern.  Lung cancer screening: If you have a greater than 20 pack year history of tobacco use, then you may qualify for lung cancer screening with a chest CT scan.   Please call your insurance company to inquire about coverage for this test.  We currently don't have screenings for other cancers besides breast, cervical, colon, and lung cancers.  If you have a strong family history of cancer or have other cancer screening concerns, please let me know.    Bone health: Get at least 150 minutes of aerobic exercise weekly Get weight bearing exercise at least once weekly Bone density test:  A bone density test is an imaging test that uses a type of X-ray to measure the amount of calcium  and other minerals in your bones. The test may be used to diagnose or screen you for a condition that causes weak or thin bones (osteoporosis), predict your risk for a broken bone (fracture), or determine how well your osteoporosis treatment is working. The bone density test is recommended for females 65 and older, or females or males <65 if certain risk factors such as thyroid  disease, long term use of steroids such as for asthma or rheumatological issues, vitamin D  deficiency, estrogen deficiency, family history  of osteoporosis, self or family history of fragility fracture in first degree relative.  Bone density normal 05/2022.   Heart health: Get at least 150 minutes of aerobic exercise weekly Limit alcohol It is important to maintain a healthy blood pressure and healthy cholesterol numbers  Heart disease screening: Screening for heart disease includes screening for blood pressure, fasting lipids, glucose/diabetes screening, BMI height to weight ratio, reviewed of smoking status, physical activity, and diet.  Goals include blood pressure 120/80 or less, maintaining a healthy lipid/cholesterol profile, preventing diabetes or keeping diabetes numbers under good control, not smoking or using tobacco products, exercising most days per week or at least 150 minutes per week of exercise, and eating healthy variety of fruits and vegetables, healthy oils, and avoiding unhealthy food choices like fried food, fast food, high sugar and high cholesterol foods.    CT coronary calcium  test 09/2020 IMPRESSION: Coronary calcium  score of 564. This was 88 percentile for age and sex matched control.  ABI normal 02/2021  Normal Echo 12/2014  Echocardiogram 12/09/2023:  1. Left ventricular ejection fraction, by estimation, is 60 to 65%. Left  ventricular ejection fraction by PLAX is 61 %. The left ventricle has  normal function. The left ventricle has no regional wall motion  abnormalities. There is moderate left  ventricular hypertrophy. Left ventricular diastolic parameters are  consistent with Grade I diastolic dysfunction (impaired relaxation).   2. Right ventricular systolic function is normal. The right ventricular  size is normal. Tricuspid regurgitation signal is inadequate for assessing  PA pressure.   3. The mitral valve is normal in structure. No evidence of mitral valve  regurgitation.   4. The aortic valve is tricuspid. Aortic valve regurgitation is trivial.  Aortic valve sclerosis is present,  with no evidence of aortic valve  stenosis.   5. Aortic dilatation noted. There is mild dilatation of the ascending  aorta, measuring 41 mm.   6. The inferior vena cava is normal in size with greater than 50%  respiratory variability, suggesting right atrial pressure of 3 mmHg.      Medical care options: I recommend you continue to seek care here first for routine care.  We try really hard to have available appointments Monday through Friday daytime hours for sick visits, acute visits, and physicals.  Urgent care should be used for after hours and weekends for significant issues that cannot wait till the next day.  The emergency department should be used for significant potentially life-threatening emergencies.  The emergency department is expensive, can often have long wait times for less significant concerns, so try to utilize primary care, urgent care, or telemedicine when possible to avoid unnecessary trips to the emergency department.  Virtual visits and telemedicine have been introduced since the pandemic started in 2020, and can be convenient ways to receive medical care.  We offer virtual appointments as well to assist you in a variety of options to seek medical care.   Advanced Directives: I recommend you consider completing a Health Care Power of Attorney and Living Will.   These documents respect your wishes and help alleviate burdens on your loved ones if you were to become terminally ill or be in a position to need those documents enforced.    You can complete Advanced Directives yourself, have them notarized, then have copies made for our office, for you and for anybody you feel should have them in safe keeping.  Or, you can have an attorney prepare these documents.   If you haven't updated your Last Will and Testament in a while, it may be worthwhile having an attorney prepare these documents together and save on some costs.      Significant other issues discussed: Ambulates  with cane, limited mobility, would benefit from Rolator walker given weakness, numbness, unsteady on feet.  Script written for Rolator walker  Hypertension-continue current medications, but increase Amlodipine  from 5mg  to 10mg  daily, continue Valsartan  80mg  daily, continue Hydrochlorothiazide   25mg  daily;   no current cardiac symptoms  Chewing tobacco use-you are high risk for oral cancer.  I recommend you stop chewing tobacco.  I recommend you follow-up with dentist yearly for surveillance  Coronary artery disease, hyperlipidemia-continue Repatha .   Fatty liver disease-I recommend weight loss to healthy low-fat low sugar diet and regular exercise.  Alcohol drinker-I recommend you quit or cut way back on alcohol.  There is no safe amount.     Impaired glucose-updated labs today    Beatrix was seen today for annual exam.  Diagnoses and all orders for this visit:  Encounter for health maintenance examination in adult -     CBC with Differential/Platelet -     Comprehensive metabolic panel with GFR -     Lipid panel -     Hemoglobin A1c -     VITAMIN D  25 Hydroxy (Vit-D Deficiency, Fractures) -     Urinalysis, Routine w reflex microscopic -     Vitamin B12 -     TSH + free T4 -     MM 3D SCREENING MAMMOGRAM BILATERAL BREAST  Essential hypertension, benign  Fatty liver  Impaired fasting blood sugar -     Hemoglobin A1c  Hyperlipidemia, unspecified hyperlipidemia type -     Lipid panel  Vitamin D  deficiency -     VITAMIN D  25 Hydroxy (Vit-D Deficiency, Fractures)  Chewing tobacco use  Abnormal thyroid  blood test -     TSH + free T4  History of alcohol abuse -     Vitamin B12  Leg weakness, bilateral -     Vitamin B12  Difficulty in walking  Screening mammogram for breast cancer -     MM 3D SCREENING MAMMOGRAM BILATERAL BREAST  F/u pending labs

## 2024-05-30 NOTE — Telephone Encounter (Signed)
 Patient mentioned wanting a Rollator during her visit today (see Chief Compliant) but not sure if you address it with her as I do not see any notes about it.    Copied from CRM [id number]. Topic: Referral - Question >> May 30, 2024 12:28 PM [name] wrote: Reason for CRM: Patient is calling about the rolator she has questions. Would like for the nurse to give her a call back.

## 2024-05-30 NOTE — Telephone Encounter (Signed)
 Patient called and needs clinical notes, insurance card faxed over to [employer] [phone]

## 2024-05-31 ENCOUNTER — Other Ambulatory Visit: Payer: Self-pay | Admitting: Medical

## 2024-05-31 ENCOUNTER — Ambulatory Visit: Payer: Self-pay | Admitting: Medical

## 2024-05-31 LAB — COMPREHENSIVE METABOLIC PANEL WITH GFR
ALT: 23 IU/L (ref 0–32)
AST: 32 IU/L (ref 0–40)
Albumin: 4.1 g/dL (ref 3.8–4.8)
Alkaline Phosphatase: 73 IU/L (ref 44–121)
BUN/Creatinine Ratio: 12 (ref 12–28)
BUN: 7 mg/dL — ABNORMAL LOW (ref 8–27)
Bilirubin Total: 0.8 mg/dL (ref 0.0–1.2)
CO2: 22 mmol/L (ref 20–29)
Calcium: 8.8 mg/dL (ref 8.7–10.3)
Chloride: 101 mmol/L (ref 96–106)
Creatinine, Ser: 0.59 mg/dL (ref 0.57–1.00)
Globulin, Total: 3.3 g/dL (ref 1.5–4.5)
Glucose: 105 mg/dL — ABNORMAL HIGH (ref 70–99)
Potassium: 4 mmol/L (ref 3.5–5.2)
Sodium: 139 mmol/L (ref 134–144)
Total Protein: 7.4 g/dL (ref 6.0–8.5)
eGFR: 95 mL/min/{1.73_m2} (ref >59)

## 2024-05-31 LAB — VITAMIN B12: Vitamin B-12: 461 pg/mL (ref 232–1245)

## 2024-05-31 LAB — CBC WITH DIFFERENTIAL/PLATELET
Basophils Absolute: 0 10*3/uL (ref 0.0–0.2)
Basos: 1 %
EOS (ABSOLUTE): 0.2 10*3/uL (ref 0.0–0.4)
Eos: 2 %
Hematocrit: 42.3 % (ref 34.0–46.6)
Hemoglobin: 14.4 g/dL (ref 11.1–15.9)
Immature Grans (Abs): 0 10*3/uL (ref 0.0–0.1)
Immature Granulocytes: 0 %
Lymphocytes Absolute: 1.7 10*3/uL (ref 0.7–3.1)
Lymphs: 26 %
MCH: 33.4 pg — ABNORMAL HIGH (ref 26.6–33.0)
MCHC: 34 g/dL (ref 31.5–35.7)
MCV: 98 fL — ABNORMAL HIGH (ref 79–97)
Monocytes Absolute: 0.8 10*3/uL (ref 0.1–0.9)
Monocytes: 12 %
Neutrophils Absolute: 3.6 10*3/uL (ref 1.4–7.0)
Neutrophils: 58 %
Platelets: 178 10*3/uL (ref 150–450)
RBC: 4.31 x10E6/uL (ref 3.77–5.28)
RDW: 12.3 % (ref 11.7–15.4)
WBC: 6.3 10*3/uL (ref 3.4–10.8)

## 2024-05-31 LAB — LIPID PANEL
Cholesterol, Total: 131 mg/dL (ref 100–199)
HDL: 62 mg/dL (ref >39)
LDL CALC COMMENT:: 2.1 ratio (ref 0.0–4.4)
LDL Chol Calc (NIH): 53 mg/dL (ref 0–99)
Triglycerides: 84 mg/dL (ref 0–149)
VLDL Cholesterol Cal: 16 mg/dL (ref 5–40)

## 2024-05-31 LAB — TSH+FREE T4
Free T4: 1.16 ng/dL (ref 0.82–1.77)
TSH: 0.898 u[IU]/mL (ref 0.450–4.500)

## 2024-05-31 LAB — HEMOGLOBIN A1C
Est. average glucose Bld gHb Est-mCnc: 117 mg/dL
Hgb A1c MFr Bld: 5.7 % — ABNORMAL HIGH (ref 4.8–5.6)

## 2024-05-31 LAB — VITAMIN D 25 HYDROXY (VIT D DEFICIENCY, FRACTURES): Vit D, 25-Hydroxy: 19.4 ng/mL — ABNORMAL LOW (ref 30.0–100.0)

## 2024-05-31 MED ORDER — HYDROCHLOROTHIAZIDE 25 MG PO TABS: 25.0000 mg | ORAL_TABLET | Freq: Every day | ORAL | 2 refills | Status: AC

## 2024-05-31 MED ORDER — REPATHA 140 MG/ML ~~LOC~~ SOSY: 140.0000 mg | PREFILLED_SYRINGE | SUBCUTANEOUS | 11 refills | Status: AC

## 2024-05-31 NOTE — Progress Notes (Signed)
 Results sent through MyChart

## 2024-06-05 ENCOUNTER — Telehealth: Payer: Self-pay | Admitting: Internal Medicine

## 2024-06-05 NOTE — Telephone Encounter (Signed)
 This was already done on 6/3. I will follow-up on Tuesday since Closed already  Copied from CRM #[id number]. Topic: Referral - Status >> Jun 05, 2024  4:05 PM [name] wrote: Reason for CRM: Patient is calling to get an update on her rolator. The patient wants to know if the clinical notes and insurance card was faxed to [employer]? Patient is requesting a call back at [phone]

## 2024-06-06 ENCOUNTER — Other Ambulatory Visit: Payer: Self-pay | Admitting: Licensed Clinical Social Worker

## 2024-06-06 NOTE — Patient Instructions (Signed)

## 2024-06-06 NOTE — Patient Outreach (Signed)
 Complex Care Management   Visit Note  06/06/2024  Name:  [name] MRN: [id number] DOB: 1951/02/27  Situation: Referral received for Complex Care Management related to Order for Rolator form PCP I obtained verbal consent from Patient.  Visit completed with patient  on the phone  Background:   Past Medical History:  Diagnosis Date   Allergic rhinitis, cause unspecified    Arthritis    Chewing tobacco use    Coronary artery disease, non-occlusive December 2006   Dyslipidemia    Essential hypertension    previously uncontrolled, now well-controlled; no evidence of renal artery stenosis   GERD (gastroesophageal reflux disease)    H/O echocardiogram 12/2014   60-65 % EF, mild basal hypertrophy focally at septum, otherwise normal   Heart murmur 12/2014   mild focal hypertrophy of septum, EF 60-65%, trivial aortic regurgitation   History of cardiac catheterization December 2006   for exertional chest pain: Nonobstructive CAD   History of migraine headaches    not an issues as of 07/2017   Onychomycosis 05/2015   Pneumococcal vaccine refused 05/2015    Assessment: Patient stated that when she met with the PCP on 05/30/2024 that the PCP gave her a written order and the order was missing some documentation and the PCP is making some corrections    Recommendation:   none  Follow Up Plan:   Closing From:  Complex Care Management  Jonda Neighbours, PhD [facility], [facility] Social Worker Direct Dial: [phone]  Fax: [phone]

## 2024-06-06 NOTE — Telephone Encounter (Signed)
 I have faxed this order again today as well asking for an update for patient

## 2024-06-08 ENCOUNTER — Ambulatory Visit

## 2024-06-13 DIAGNOSIS — R262 Difficulty in walking, not elsewhere classified: Secondary | ICD-10-CM | POA: Diagnosis not present

## 2024-06-20 ENCOUNTER — Other Ambulatory Visit: Payer: Self-pay

## 2024-06-20 ENCOUNTER — Other Ambulatory Visit: Payer: Self-pay | Admitting: Medical

## 2024-06-20 ENCOUNTER — Other Ambulatory Visit (HOSPITAL_COMMUNITY): Payer: Self-pay

## 2024-06-20 MED ORDER — REPATHA 140 MG/ML ~~LOC~~ SOSY
140.0000 mg | PREFILLED_SYRINGE | SUBCUTANEOUS | 11 refills | Status: AC
  Filled 2024-06-20 – 2024-06-23 (×3): qty 2, 28d supply, fill #0
  Filled 2024-07-24: qty 2, 28d supply, fill #1
  Filled 2024-08-17 – 2024-08-18 (×2): qty 2, 28d supply, fill #2
  Filled 2024-10-09: qty 2, 28d supply, fill #3
  Filled 2024-11-06: qty 2, 28d supply, fill #4
  Filled 2024-12-06: qty 2, 28d supply, fill #5
  Filled 2025-01-01 (×2): qty 2, 28d supply, fill #6
  Filled 2025-01-25: qty 2, 28d supply, fill #7

## 2024-06-23 ENCOUNTER — Other Ambulatory Visit (HOSPITAL_COMMUNITY): Payer: Self-pay

## 2024-06-23 ENCOUNTER — Other Ambulatory Visit: Payer: Self-pay

## 2024-07-24 ENCOUNTER — Other Ambulatory Visit (HOSPITAL_COMMUNITY): Payer: Self-pay

## 2024-08-17 ENCOUNTER — Other Ambulatory Visit (HOSPITAL_COMMUNITY): Payer: Self-pay

## 2024-08-18 ENCOUNTER — Other Ambulatory Visit (HOSPITAL_COMMUNITY): Payer: Self-pay

## 2024-09-08 ENCOUNTER — Other Ambulatory Visit (HOSPITAL_COMMUNITY): Payer: Self-pay

## 2024-09-09 ENCOUNTER — Other Ambulatory Visit: Payer: Self-pay | Admitting: Medical

## 2024-10-09 ENCOUNTER — Other Ambulatory Visit (HOSPITAL_COMMUNITY): Payer: Self-pay

## 2024-10-09 ENCOUNTER — Other Ambulatory Visit: Payer: Self-pay

## 2024-10-19 ENCOUNTER — Other Ambulatory Visit: Payer: Self-pay | Admitting: Cardiology

## 2024-11-06 ENCOUNTER — Other Ambulatory Visit (HOSPITAL_COMMUNITY): Payer: Self-pay

## 2024-11-14 ENCOUNTER — Other Ambulatory Visit: Payer: Self-pay

## 2024-11-24 ENCOUNTER — Other Ambulatory Visit: Payer: Self-pay | Admitting: Cardiology

## 2024-12-06 ENCOUNTER — Other Ambulatory Visit (HOSPITAL_COMMUNITY): Payer: Self-pay

## 2024-12-06 ENCOUNTER — Other Ambulatory Visit: Payer: Self-pay

## 2024-12-07 ENCOUNTER — Other Ambulatory Visit: Payer: Self-pay

## 2024-12-08 ENCOUNTER — Other Ambulatory Visit: Payer: Self-pay

## 2024-12-11 ENCOUNTER — Other Ambulatory Visit (HOSPITAL_COMMUNITY): Payer: Self-pay

## 2024-12-13 ENCOUNTER — Other Ambulatory Visit: Payer: Self-pay | Admitting: Cardiology

## 2025-01-01 ENCOUNTER — Other Ambulatory Visit: Payer: Self-pay

## 2025-01-01 ENCOUNTER — Other Ambulatory Visit (HOSPITAL_COMMUNITY): Payer: Self-pay

## 2025-01-22 ENCOUNTER — Telehealth: Payer: Self-pay | Admitting: Medical

## 2025-01-22 NOTE — Telephone Encounter (Signed)
 Called pt for Gap in care & made appt with you but she states she is out of her Valsartan  & she is no longer needs to see Cardiologist and wants you to fill this medication to [facility] pharmacy.  [place] is swelling since she is out of it.

## 2025-01-22 NOTE — Telephone Encounter (Signed)
 Called pt regarding Gaps in care for Mammogram, she states she is taking care of 2 young grandkids now daily but will schedule asap.

## 2025-01-23 ENCOUNTER — Other Ambulatory Visit: Payer: Self-pay | Admitting: Medical

## 2025-01-23 MED ORDER — VALSARTAN 80 MG PO TABS: 80.0000 mg | ORAL_TABLET | Freq: Every day | ORAL | 0 refills | Status: AC

## 2025-01-25 ENCOUNTER — Other Ambulatory Visit (HOSPITAL_COMMUNITY): Payer: Self-pay

## 2025-01-25 NOTE — Progress Notes (Signed)
 [name]                                          MRN: [id number]   01/25/2025   The [facility] Quality Team Specialist reviewed this patient medical record for the purposes of chart review for care gap closure. The following were reviewed: chart review for care gap closure-controlling blood pressure.    [facility]BCI Quality Team

## 2025-01-26 ENCOUNTER — Other Ambulatory Visit: Payer: Self-pay

## 2025-01-29 ENCOUNTER — Other Ambulatory Visit (HOSPITAL_COMMUNITY): Payer: Self-pay

## 2025-01-29 ENCOUNTER — Other Ambulatory Visit: Payer: Self-pay | Admitting: Medical

## 2025-01-29 ENCOUNTER — Telehealth: Payer: Self-pay | Admitting: Internal Medicine

## 2025-01-29 MED ORDER — REPATHA SURECLICK 140 MG/ML ~~LOC~~ SOAJ: 140.0000 mg | SUBCUTANEOUS | 2 refills | Status: AC

## 2025-02-02 ENCOUNTER — Ambulatory Visit: Admitting: Medical

## 2025-02-08 ENCOUNTER — Ambulatory Visit: Admitting: Medical

## 2025-04-24 ENCOUNTER — Ambulatory Visit: Payer: Self-pay
